# Patient Record
Sex: Female | Born: 1975 | Race: White | Hispanic: No | Smoking: Never smoker
Health system: Southern US, Community
[De-identification: ages and names within clinical notes are randomized; demographics above are authoritative.]

## PROBLEM LIST (undated history)

## (undated) DIAGNOSIS — K219 Gastro-esophageal reflux disease without esophagitis: Secondary | ICD-10-CM

## (undated) DIAGNOSIS — F329 Major depressive disorder, single episode, unspecified: Secondary | ICD-10-CM

## (undated) DIAGNOSIS — F419 Anxiety disorder, unspecified: Secondary | ICD-10-CM

## (undated) DIAGNOSIS — M549 Dorsalgia, unspecified: Secondary | ICD-10-CM

## (undated) DIAGNOSIS — Z87440 Personal history of urinary (tract) infections: Secondary | ICD-10-CM

## (undated) DIAGNOSIS — G4730 Sleep apnea, unspecified: Secondary | ICD-10-CM

## (undated) DIAGNOSIS — F32A Depression, unspecified: Secondary | ICD-10-CM

## (undated) DIAGNOSIS — A0472 Enterocolitis due to Clostridium difficile, not specified as recurrent: Secondary | ICD-10-CM

## (undated) DIAGNOSIS — N946 Dysmenorrhea, unspecified: Secondary | ICD-10-CM

## (undated) DIAGNOSIS — E559 Vitamin D deficiency, unspecified: Secondary | ICD-10-CM

## (undated) DIAGNOSIS — T7840XA Allergy, unspecified, initial encounter: Secondary | ICD-10-CM

## (undated) DIAGNOSIS — E7800 Pure hypercholesterolemia, unspecified: Secondary | ICD-10-CM

## (undated) DIAGNOSIS — Z9109 Other allergy status, other than to drugs and biological substances: Secondary | ICD-10-CM

## (undated) HISTORY — DX: Dysmenorrhea, unspecified: N94.6

## (undated) HISTORY — DX: Dorsalgia, unspecified: M54.9

## (undated) HISTORY — DX: Depression, unspecified: F32.A

## (undated) HISTORY — DX: Allergy, unspecified, initial encounter: T78.40XA

## (undated) HISTORY — DX: Personal history of urinary (tract) infections: Z87.440

## (undated) HISTORY — DX: Anxiety disorder, unspecified: F41.9

## (undated) HISTORY — DX: Pure hypercholesterolemia, unspecified: E78.00

## (undated) HISTORY — DX: Other allergy status, other than to drugs and biological substances: Z91.09

## (undated) HISTORY — DX: Vitamin D deficiency, unspecified: E55.9

## (undated) HISTORY — DX: Gastro-esophageal reflux disease without esophagitis: K21.9

## (undated) HISTORY — DX: Enterocolitis due to Clostridium difficile, not specified as recurrent: A04.72

## (undated) HISTORY — DX: Major depressive disorder, single episode, unspecified: F32.9

---

## 2001-01-10 HISTORY — PX: NASAL SINUS SURGERY: SHX719

## 2006-05-16 ENCOUNTER — Ambulatory Visit: Payer: Self-pay | Admitting: Emergency Medicine

## 2007-03-28 ENCOUNTER — Ambulatory Visit: Payer: Self-pay | Admitting: Internal Medicine

## 2007-12-20 ENCOUNTER — Ambulatory Visit: Payer: Self-pay | Admitting: Internal Medicine

## 2008-03-18 ENCOUNTER — Ambulatory Visit: Payer: Self-pay | Admitting: Family Medicine

## 2011-08-01 LAB — HM PAP SMEAR: HM Pap smear: NEGATIVE

## 2011-11-17 ENCOUNTER — Telehealth: Payer: Self-pay | Admitting: *Deleted

## 2011-11-18 ENCOUNTER — Other Ambulatory Visit: Payer: Self-pay | Admitting: *Deleted

## 2011-11-23 MED ORDER — DESOGESTREL-ETHINYL ESTRADIOL 0.15-0.02/0.01 MG (21/5) PO TABS: 1.0000 | ORAL_TABLET | Freq: Every day | ORAL | Status: DC

## 2011-11-23 NOTE — Telephone Encounter (Signed)
Refill sent to pharmacy for birth control pills. Patient had a appointment scheduled for 11/25/11 and canceled. Patient had been given six refills on script; called pharmacy and advised to cancel refillls until patient seen in office.

## 2011-11-23 NOTE — Telephone Encounter (Signed)
Medication refilled

## 2011-11-25 ENCOUNTER — Ambulatory Visit: Payer: Self-pay | Admitting: Internal Medicine

## 2011-12-21 ENCOUNTER — Telehealth: Payer: Self-pay | Admitting: Internal Medicine

## 2011-12-21 NOTE — Telephone Encounter (Signed)
Refill on her [facility] 0.15-0.2/0.01. Patient has an appointment in January.

## 2011-12-22 ENCOUNTER — Other Ambulatory Visit: Payer: Self-pay | Admitting: *Deleted

## 2011-12-22 MED ORDER — DESOGESTREL-ETHINYL ESTRADIOL 0.15-0.02/0.01 MG (21/5) PO TABS: 1.0000 | ORAL_TABLET | Freq: Every day | ORAL | Status: DC

## 2011-12-22 NOTE — Telephone Encounter (Signed)
Refilled birth control by phone with paharmacy

## 2012-01-31 ENCOUNTER — Other Ambulatory Visit: Payer: Self-pay | Admitting: Internal Medicine

## 2012-01-31 ENCOUNTER — Encounter: Payer: Self-pay | Admitting: Internal Medicine

## 2012-01-31 ENCOUNTER — Ambulatory Visit (INDEPENDENT_AMBULATORY_CARE_PROVIDER_SITE_OTHER): Payer: BC Managed Care – PPO | Admitting: Internal Medicine

## 2012-01-31 VITALS — BP 116/70 | HR 69 | Temp 98.2°F | Ht 70.5 in | Wt 176.5 lb

## 2012-01-31 DIAGNOSIS — Z139 Encounter for screening, unspecified: Secondary | ICD-10-CM

## 2012-01-31 DIAGNOSIS — N760 Acute vaginitis: Secondary | ICD-10-CM

## 2012-01-31 DIAGNOSIS — Z9109 Other allergy status, other than to drugs and biological substances: Secondary | ICD-10-CM

## 2012-01-31 MED ORDER — CLONAZEPAM 0.5 MG PO TABS: ORAL_TABLET | ORAL | Status: DC

## 2012-02-01 LAB — WET PREP BY MOLECULAR PROBE
Candida species: NEGATIVE
Trichomonas vaginosis: NEGATIVE

## 2012-02-03 ENCOUNTER — Encounter: Payer: Self-pay | Admitting: *Deleted

## 2012-02-05 ENCOUNTER — Encounter: Payer: Self-pay | Admitting: Internal Medicine

## 2012-02-05 DIAGNOSIS — Z9109 Other allergy status, other than to drugs and biological substances: Secondary | ICD-10-CM | POA: Insufficient documentation

## 2012-02-05 NOTE — Progress Notes (Signed)
Subjective:    Patient ID: Melissa Higgins, female    DOB: [birth year], 37 y.o.   MRN: [id number]  HPI 37 year old female with past history of reoccurring allergies and sinus issues who comes in today for a scheduled follow up.  She has been under increased stress lately with family/relationship issues.  Is separated now.  Feels she is handling things relatively well.  Does need something to help her relax and sleep.  She has previously tried [place].  Did not work for her.  Would like to try something else.  Overall doing better handling the stress.  Getting out.  Exercising.  Saw her allergist yesterday.  Using mucinex.  Humidifier.  Breathing stable.  Bowels stable.  She does report increased vaginal odor.  No urinary symptoms.   Past Medical History  Diagnosis Date  . Hypercholesterolemia   . Environmental allergies     allergy shots  . Dysmenorrhea   . C. difficile colitis     augment use  . History of frequent urinary tract infections     Current Outpatient Prescriptions on File Prior to Visit  Medication Sig Dispense Refill  . desogestrel-ethinyl estradiol (KARIVA,AZURETTE,MIRCETTE) 0.15-0.02/0.01 MG (21/5) tablet Take 1 tablet by mouth daily.  1 Package  0  . fexofenadine (ALLEGRA) 180 MG tablet Take 180 mg by mouth daily.      . clonazePAM (KLONOPIN) 0.5 MG tablet 1/2 tablet q hs prn  20 tablet  0    Review of Systems Patient denies any headache, lightheadedness or dizziness.  Some allergy symptoms.  Saw her allergist yesterday.   No chest pain, tightness or palpitations.  No increased shortness of breath, cough or congestion.  No nausea or vomiting.  No acid reflux.  No abdominal pain or cramping.  No bowel change, such as diarrhea, constipation, BRBPR or melana.  No urine change.  Does report the vaginal odor as outlined.        Objective:   Physical Exam Filed Vitals:   01/31/12 1503  BP: 116/70  Pulse: 69  Temp: 98.2 F (10.24 C)   37 year old female in no acute distress.    HEENT:  Nares- clear.  Oropharynx - without lesions. NECK:  Supple.  Nontender.  No audible bruit.  HEART:  Appears to be regular. LUNGS:  No crackles or wheezing audible.  Respirations even and unlabored.  RADIAL PULSE:  Equal bilaterally.   ABDOMEN:  Soft, nontender.  Bowel sounds present and normal.  No audible abdominal bruit.  GU:  Normal external genitalia.  Vaginal vault without lesions.  Cervix identified.  No lesions.  Could not appreciate any adnexal masses or tenderness.  Discharge present.  Wet prep sent.  EXTREMITIES:  No increased edema present.  DP pulses palpable and equal bilaterally.           Assessment & Plan:  VAGINITIS.  Wet prep sent.    INCREASED PSYCHOSOCIAL STRESSORS.  Feels she is handling things relatively well.  Needs something to help her sleep.  See above.  Did not do well with ambien.  Will try clonazepam .5mg  1/2 tablet q hs prn.  She is not sexually active.  Instructed - contraindication with pregnancy.   Follow. Seeing Madaline Guthrie.  Due to follow up with him today.    HEADACHES.  Better.  Follow.    THYROID FULLNESS.  Ultrasound (thyroid) 03/28/07 - unremarkable.  TSH checked 06/21/11 - normal.  Follow.    HEALTH MAINTENANCE.  Physical 05/11/11.  Pap 08/01/11.

## 2012-02-05 NOTE — Assessment & Plan Note (Signed)
Sees Dr McQueen.  Receiving allergy injections.  Follow.    

## 2012-03-07 ENCOUNTER — Telehealth: Payer: Self-pay | Admitting: *Deleted

## 2012-03-07 NOTE — Telephone Encounter (Signed)
Patient called regarding refill;advised her to call her pharmacy and they would contact us

## 2012-03-08 ENCOUNTER — Other Ambulatory Visit: Payer: Self-pay | Admitting: *Deleted

## 2012-03-08 MED ORDER — CLONAZEPAM 0.5 MG PO TABS: ORAL_TABLET | ORAL | Status: DC

## 2012-03-08 NOTE — Telephone Encounter (Signed)
[facility] calling, state that they have tried faxing request for refill several times but keep getting  Fail message back.  Confirmed fax# is correct.  Needing clonazepam 0.5 #20.   Fax [phone].

## 2012-03-08 NOTE — Telephone Encounter (Signed)
Called in refill x 1.  (#20 with no refills).  Clonazepam.

## 2012-03-20 ENCOUNTER — Ambulatory Visit (INDEPENDENT_AMBULATORY_CARE_PROVIDER_SITE_OTHER): Payer: BC Managed Care – PPO | Admitting: Adult Health

## 2012-03-20 ENCOUNTER — Encounter: Payer: Self-pay | Admitting: Adult Health

## 2012-03-20 VITALS — BP 119/75 | HR 71 | Temp 98.4°F | Resp 16 | Ht 70.0 in | Wt 175.0 lb

## 2012-03-20 DIAGNOSIS — M25552 Pain in left hip: Secondary | ICD-10-CM | POA: Insufficient documentation

## 2012-03-20 DIAGNOSIS — M25559 Pain in unspecified hip: Secondary | ICD-10-CM

## 2012-03-20 DIAGNOSIS — J329 Chronic sinusitis, unspecified: Secondary | ICD-10-CM

## 2012-03-20 MED ORDER — AZITHROMYCIN 250 MG PO TABS: ORAL_TABLET | ORAL | Status: DC

## 2012-03-20 MED ORDER — FLUTICASONE PROPIONATE 50 MCG/ACT NA SUSP: 2.0000 | Freq: Every day | NASAL | Status: DC

## 2012-03-20 MED ORDER — DICLOFENAC SODIUM 1 % TD GEL: 4.0000 g | Freq: Four times a day (QID) | TRANSDERMAL | Status: DC

## 2012-03-20 MED ORDER — IBUPROFEN 800 MG PO TABS: 800.0000 mg | ORAL_TABLET | Freq: Three times a day (TID) | ORAL | Status: DC | PRN

## 2012-03-20 NOTE — Assessment & Plan Note (Signed)
Patient is very athletic. She works out with a Psychologist, educational regularly. She had a similar injury in the summer where her right hip became painful. However, this completely subsided. Given her symptoms this could very well be inflammation of the bursa which is very common in an active person. This could also be an overuse injury. We will try high dose anti-inflammatory for 3-4 days, rest, ice/heat 3-4 times daily, and topical anti-inflammatory medication. I have advised her to use a firm pillow between her knees when she sleeps on her side, she should avoid lying on the left hip, use a pillow under her knees when lying on her back, stretching exercises, she can continue the Epsom salt and Jacuzzi. I have asked her to pay close attention to those exercises that may cause discomfort. Avoid those. She may want to try an elliptical machine over a treadmill to avoid stress on her hips. RTC in 2 weeks or sooner if symptoms do not improve.

## 2012-03-20 NOTE — Patient Instructions (Addendum)
  For your sinuses:  Start you antibiotic tonight.  Also start the flonase nasal spray.  For your hip:  Take high dose ibuprofen every 6-8 hours for 3-4 days. Take this with food.  Apply voltaren gel to the hip for topical pain relief four times daily.  Rest the area. Apply ice alternating with heat 3-4 times daily.  Use pillow between your knees if you sleep on your side. Use a pillow under your knees while lying on you back.  Please call if your symptoms do not improve within 2 weeks.

## 2012-03-20 NOTE — Progress Notes (Signed)
  Subjective:    Patient ID: Melissa Higgins, female    DOB: 08/08/1975, 37 y.o.   MRN: [id number]  HPI  Patient is a pleasant 37 year old female who presents to clinic with the following concerns:  1. Left hip pain - patient has a history of right hip pain that began last summer. She describes her symptoms as resolving. Now her left hip has been bothering her. She exercises regularly and also has a trainer that she works out with. She reports initially the pain was shooting down the outside of her thigh. This has resolved. Now the pain is described as dull which is localized over her the lateral hip. Sitting aggravates her symptoms. She has tried Epsom salt soaks, sitting in a Jacuzzi, and stretching exercises. All of these produced relief. She has not tried any over-the-counter medications.  2. Sinus pressure - the patient has had a sinus headache for approximately 2-3 days. She reports dry sinuses, eye discomfort, sore throat. She has tried Mucinex and is currently taking this. She is also using her saline nasal rinses. She denies fever, chills, shortness of breath. Patient has a history of allergic rhinitis which seem to aggravate her symptoms. She has also had sinus surgery in the past and reports occasionally having problems.   Current Outpatient Prescriptions on File Prior to Visit  Medication Sig Dispense Refill  . clonazePAM (KLONOPIN) 0.5 MG tablet 1/2 tablet q hs prn  20 tablet  0  . desogestrel-ethinyl estradiol (KARIVA,AZURETTE,MIRCETTE) 0.15-0.02/0.01 MG (21/5) tablet Take 1 tablet by mouth daily.  1 Package  0  . guaiFENesin (MUCINEX) 600 MG 12 hr tablet Take 1,200 mg by mouth 2 (two) times daily.       No current facility-administered medications on file prior to visit.      Review of Systems  Constitutional: Negative for chills and fatigue.  HENT: Positive for postnasal drip and sinus pressure. Negative for rhinorrhea.        Dry sinuses. Pressure and HA for 2-3 days.  Eyes:  Negative for itching.  Respiratory: Negative for chest tightness, shortness of breath and wheezing.   Cardiovascular: Negative for chest pain.  Musculoskeletal:       Left hip pain. Initially reported as shooting pain now dull ache. Exercises with trainer regularly.    BP 119/75  Pulse 71  Temp(Src) 98.4 F (36.9 C) (Oral)  Resp 16  Ht 5\' 10"  (1.778 m)  Wt 175 lb (79.379 kg)  BMI 25.11 kg/m2  SpO2 98%  PF 7 L/min  LMP 03/13/2012     Objective:   Physical Exam  Constitutional: She appears well-developed and well-nourished.  HENT:  Pharyngeal erythema. Post nasal drip  Cardiovascular: Normal rate, regular rhythm and normal heart sounds.  Exam reveals no gallop.   No murmur heard. Pulmonary/Chest: Effort normal and breath sounds normal. She has no wheezes. She has no rales.  Musculoskeletal: Normal range of motion. She exhibits no edema.  No tenderness to palpating left hip. No clicking   Lymphadenopathy:    She has no cervical adenopathy.  Psychiatric: She has a normal mood and affect. Her behavior is normal. Judgment and thought content normal.        Assessment & Plan:

## 2012-03-20 NOTE — Assessment & Plan Note (Signed)
I suspect her sinusitis is aggravated by her persistent allergic rhinitis. She has c/o of sore throat. I will start her on a Z-Pak. Patient is allergic to Augmentin. I will also start her on Flonase 2 sprays in each nostril daily. Patient takes daily antihistamine which can be very drying to her sinuses; however, she is unable to go without her medication because of the severity of her allergy symptoms. Patient also sees an allergist and receives allergy injections.

## 2012-03-26 ENCOUNTER — Telehealth: Payer: Self-pay | Admitting: *Deleted

## 2012-03-26 NOTE — Telephone Encounter (Signed)
Patient left a message on voicemail stating the prescription cream/gel Raquel gave her at her OV last week, the pharmacy is having trouble filling that. Stated it was something about her insurance company. Please call patient.

## 2012-03-26 NOTE — Telephone Encounter (Signed)
Spoke to patient and was advised that she thinks that she needs a prior authorization on the medication. Patient states that she will call the pharmacy and have them send the request for a PA directly to Raquel. Patient states that she will probably go ahead and pay for the medication herself so that she can start using it, but does want a PA done on it for future refills.

## 2012-04-04 ENCOUNTER — Other Ambulatory Visit: Payer: Self-pay | Admitting: *Deleted

## 2012-04-04 ENCOUNTER — Telehealth: Payer: Self-pay | Admitting: *Deleted

## 2012-04-04 MED ORDER — DESOGESTREL-ETHINYL ESTRADIOL 0.15-0.02/0.01 MG (21/5) PO TABS: 1.0000 | ORAL_TABLET | Freq: Every day | ORAL | Status: DC

## 2012-04-04 NOTE — Telephone Encounter (Signed)
Melissa Higgins,  Could you let me know the status of the prior auth for this patient and if there is anything I need to do. The medication is Voltaren 1% gel.  Melissa Higgins

## 2012-04-04 NOTE — Telephone Encounter (Signed)
I have not received anything on this pt. 

## 2012-04-05 ENCOUNTER — Telehealth: Payer: Self-pay | Admitting: *Deleted

## 2012-04-05 NOTE — Telephone Encounter (Signed)
Requested prior auth form be faxed to the office from pharmacy for Voltaren 1% gel

## 2012-04-10 ENCOUNTER — Telehealth: Payer: Self-pay | Admitting: *Deleted

## 2012-04-10 NOTE — Telephone Encounter (Signed)
Called [phone] for prior authorization on the Voltaren 1% gel, form is being faxed over now

## 2012-04-11 ENCOUNTER — Telehealth: Payer: Self-pay | Admitting: *Deleted

## 2012-04-11 NOTE — Telephone Encounter (Signed)
Faxed Prior Auth to [employer]

## 2012-08-09 ENCOUNTER — Encounter: Payer: Self-pay | Admitting: Internal Medicine

## 2012-08-09 ENCOUNTER — Ambulatory Visit (INDEPENDENT_AMBULATORY_CARE_PROVIDER_SITE_OTHER): Payer: BC Managed Care – PPO | Admitting: Internal Medicine

## 2012-08-09 VITALS — BP 100/60 | HR 76 | Temp 98.5°F | Ht 70.75 in | Wt 179.2 lb

## 2012-08-09 DIAGNOSIS — Z9109 Other allergy status, other than to drugs and biological substances: Secondary | ICD-10-CM

## 2012-08-09 DIAGNOSIS — N760 Acute vaginitis: Secondary | ICD-10-CM

## 2012-08-11 ENCOUNTER — Encounter: Payer: Self-pay | Admitting: Internal Medicine

## 2012-08-11 NOTE — Assessment & Plan Note (Signed)
Sees Dr McQueen.  Receiving allergy injections.  Follow.    

## 2012-08-11 NOTE — Progress Notes (Signed)
Subjective:    Patient ID: Melissa Higgins, female    DOB: 08/06/1975, 37 y.o.   MRN: [id number]  HPI 37 year old female with past history of reoccurring allergies and sinus issues who comes in today to follow up on these issues as well as for a complete physical exam.  She has been under increased stress lately with family/relationship issues.  Is separated now.  Planning to finalize her divorce next week.  Feels she is handling things relatively well.  Overall doing better handling the stress.  Getting out.  Exercising.  Due to see her counselor later today.  Sees [employer].  Helping.  Breathing stable.  Bowels stable.  She does report increased vaginal odor.  No urinary symptoms.    Past Medical History  Diagnosis Date  . Hypercholesterolemia   . Environmental allergies     allergy shots  . Dysmenorrhea   . C. difficile colitis     augment use  . History of frequent urinary tract infections     Current Outpatient Prescriptions on File Prior to Visit  Medication Sig Dispense Refill  . desogestrel-ethinyl estradiol (KARIVA,AZURETTE,MIRCETTE) 0.15-0.02/0.01 MG (21/5) tablet Take 1 tablet by mouth daily.  1 Package  6  . guaiFENesin (MUCINEX) 600 MG 12 hr tablet Take 1,200 mg by mouth as needed.       Marland Kitchen ibuprofen (ADVIL,MOTRIN) 800 MG tablet Take 1 tablet (800 mg total) by mouth every 8 (eight) hours as needed for pain.  45 tablet  1   No current facility-administered medications on file prior to visit.    Review of Systems Patient denies any headache, lightheadedness or dizziness.  No significant sinus or allergy symptoms now.  No chest pain, tightness or palpitations.  No increased shortness of breath, cough or congestion.  No nausea or vomiting.  No acid reflux.  No abdominal pain or cramping.  No bowel change, such as diarrhea, constipation, BRBPR or melana.  No urine change.  Does report the vaginal odor as outlined.   Not taking anything now to help her sleep.  Sleeping better.  LMP  last week.  Feet doing better since she had shoes made.       Objective:   Physical Exam  Filed Vitals:   08/09/12 0835  BP: 100/60  Pulse: 76  Temp: 98.5 F (36.9 C)   Pulse recheck:  75  37 year old female in no acute distress.   HEENT:  Nares- clear.  Oropharynx - without lesions. NECK:  Supple.  Nontender.  No audible bruit.  HEART:  Appears to be regular. LUNGS:  No crackles or wheezing audible.  Respirations even and unlabored.  RADIAL PULSE:  Equal bilaterally.    BREASTS:  No nipple discharge or nipple retraction present.  Could not appreciate any distinct nodules or axillary adenopathy.  ABDOMEN:  Soft, nontender.  Bowel sounds present and normal.  No audible abdominal bruit.  GU:  Normal external genitalia.  Vaginal vault without lesions.  Cervix identified.  No lesions.  Discharge present.  Could not appreciate any adnexal masses or tenderness.  EXTREMITIES:  No increased edema present.  DP pulses palpable and equal bilaterally.           Assessment & Plan:  VAGINITIS.  Wet prep sent.    INCREASED PSYCHOSOCIAL STRESSORS.  Feels she is handling things relatively well.  Doing better.  Sleeping better.  Follow. Seeing Madaline Guthrie.  Due to follow up with him today.    HEADACHES.  Better.  Follow.    THYROID FULLNESS.  Ultrasound (thyroid) 03/28/07 - unremarkable.  TSH checked 06/21/11 - normal.  Follow.    HEALTH MAINTENANCE.  Physical today.  Pap 08/01/11.

## 2012-08-12 ENCOUNTER — Encounter: Payer: Self-pay | Admitting: Internal Medicine

## 2012-08-13 LAB — WET PREP BY MOLECULAR PROBE
Candida species: POSITIVE — AB
Gardnerella vaginalis: NEGATIVE
Trichomonas vaginosis: NEGATIVE

## 2012-08-14 ENCOUNTER — Encounter: Payer: Self-pay | Admitting: Internal Medicine

## 2012-08-20 ENCOUNTER — Encounter: Payer: Self-pay | Admitting: Internal Medicine

## 2012-08-27 ENCOUNTER — Telehealth: Payer: Self-pay | Admitting: Internal Medicine

## 2012-08-27 NOTE — Telephone Encounter (Signed)
Lupita Leash, can you put this pt in at 1:00? She is already aware of the appt. Thanks

## 2012-08-27 NOTE — Telephone Encounter (Signed)
Pt calling regarding an appt w/ Dr. Lorin Picket.  States she is to be added on to schedule Wednesday per email.  Pt states she can come on Wednesday morning.  Please advise.

## 2012-08-27 NOTE — Telephone Encounter (Signed)
Where do you want to put her on Wednesday's schedule?

## 2012-08-27 NOTE — Telephone Encounter (Signed)
I can see her Wednesday early pm.  My am is full.  I will be happy to work her in somewhere around 1:00 Wed if possible.

## 2012-09-17 ENCOUNTER — Other Ambulatory Visit: Payer: Self-pay | Admitting: Internal Medicine

## 2012-09-17 NOTE — Telephone Encounter (Signed)
Eprescribed.

## 2012-11-15 ENCOUNTER — Other Ambulatory Visit: Payer: Self-pay

## 2012-12-03 ENCOUNTER — Ambulatory Visit (INDEPENDENT_AMBULATORY_CARE_PROVIDER_SITE_OTHER): Payer: BC Managed Care – PPO | Admitting: Adult Health

## 2012-12-03 ENCOUNTER — Encounter: Payer: Self-pay | Admitting: Adult Health

## 2012-12-03 VITALS — BP 118/80 | HR 78 | Temp 97.8°F | Resp 16 | Wt 179.0 lb

## 2012-12-03 DIAGNOSIS — R21 Rash and other nonspecific skin eruption: Secondary | ICD-10-CM

## 2012-12-03 MED ORDER — ERYTHROMYCIN 2 % EX GEL: Freq: Two times a day (BID) | CUTANEOUS | Status: DC

## 2012-12-03 NOTE — Progress Notes (Signed)
  Subjective:    Patient ID: Melissa Higgins, female    DOB: [birth year], 37 y.o.   MRN: [id number]  HPI Patient is a pleasant 37 year old female who presents to clinic with a nonspecific skin rash that is reoccurring x6 weeks. The rash is located below the left side corner of her nose and the left side of chin below her lip. She has treated it with over-the-counter acne medication without complete resolution. The rash is not itchy; it is not painful. She denies drainage from the site.   Current Outpatient Prescriptions on File Prior to Visit  Medication Sig Dispense Refill  . guaiFENesin (MUCINEX) 600 MG 12 hr tablet Take 1,200 mg by mouth as needed.       Marland Kitchen ibuprofen (ADVIL,MOTRIN) 800 MG tablet Take 1 tablet (800 mg total) by mouth every 8 (eight) hours as needed for pain.  45 tablet  1  . KARIVA 0.15-0.02/0.01 MG (21/5) tablet TAKE ONE TABLET BY MOUTH DAILY  28 tablet  6   No current facility-administered medications on file prior to visit.      Review of Systems  Skin: Positive for rash.       Left side corner of nose and left side of chin below the lip       Objective:   Physical Exam  Constitutional: She is oriented to person, place, and time. She appears well-developed and well-nourished. No distress.  Neurological: She is alert and oriented to person, place, and time.  Skin: Rash noted.  Area of rash with raised areas. Vesicle appearance.   Psychiatric: She has a normal mood and affect. Her behavior is normal. Thought content normal.   BP 118/80  Pulse 78  Temp(Src) 97.8 F (36.6 C) (Oral)  Resp 16  Wt 179 lb (81.194 kg)  SpO2 98%        Assessment & Plan:

## 2012-12-03 NOTE — Assessment & Plan Note (Signed)
?   Acne vs. Viral or other bacterial producing rash. Send viral culture and routine culture. Treat empirically with erythromycin gel twice a day.

## 2012-12-03 NOTE — Progress Notes (Signed)
Pre visit review using our clinic review tool, if applicable. No additional management support is needed unless otherwise documented below in the visit note. 

## 2012-12-06 ENCOUNTER — Encounter: Payer: Self-pay | Admitting: Adult Health

## 2012-12-06 LAB — WOUND CULTURE
Gram Stain: NONE SEEN
Organism ID, Bacteria: NO GROWTH

## 2012-12-10 LAB — VIRAL CULTURE VIRC

## 2012-12-20 ENCOUNTER — Ambulatory Visit: Payer: BC Managed Care – PPO | Admitting: Internal Medicine

## 2013-02-04 ENCOUNTER — Encounter: Payer: Self-pay | Admitting: Internal Medicine

## 2013-02-05 NOTE — Telephone Encounter (Signed)
Noted  

## 2013-02-12 ENCOUNTER — Encounter: Payer: Self-pay | Admitting: Internal Medicine

## 2013-02-12 ENCOUNTER — Ambulatory Visit (INDEPENDENT_AMBULATORY_CARE_PROVIDER_SITE_OTHER): Payer: BC Managed Care – PPO | Admitting: Internal Medicine

## 2013-02-12 ENCOUNTER — Other Ambulatory Visit: Payer: Self-pay | Admitting: Internal Medicine

## 2013-02-12 VITALS — BP 120/82 | HR 70 | Temp 97.9°F | Ht 70.75 in | Wt 181.5 lb

## 2013-02-12 DIAGNOSIS — G479 Sleep disorder, unspecified: Secondary | ICD-10-CM

## 2013-02-12 DIAGNOSIS — Z9109 Other allergy status, other than to drugs and biological substances: Secondary | ICD-10-CM

## 2013-02-12 MED ORDER — PANTOPRAZOLE SODIUM 40 MG PO TBEC: 40.0000 mg | DELAYED_RELEASE_TABLET | Freq: Every day | ORAL | Status: DC

## 2013-02-12 MED ORDER — TRAZODONE HCL 50 MG PO TABS: 25.0000 mg | ORAL_TABLET | Freq: Every evening | ORAL | Status: DC | PRN

## 2013-02-12 NOTE — Progress Notes (Signed)
Pre-visit discussion using our clinic review tool. No additional management support is needed unless otherwise documented below in the visit note.  

## 2013-02-17 ENCOUNTER — Encounter: Payer: Self-pay | Admitting: Internal Medicine

## 2013-02-17 ENCOUNTER — Telehealth: Payer: Self-pay | Admitting: Internal Medicine

## 2013-02-17 DIAGNOSIS — G479 Sleep disorder, unspecified: Secondary | ICD-10-CM | POA: Insufficient documentation

## 2013-02-17 NOTE — Telephone Encounter (Signed)
Pt needs f/u appt in 2 months (26min) or end of 1/2 day.

## 2013-02-17 NOTE — Progress Notes (Signed)
  Subjective:    Patient ID: Melissa Higgins, female    DOB: 05/20/1975, 38 y.o.   MRN: [id number]  HPI 38 year old female with past history of reoccurring allergies and sinus issues who comes in today for a scheduled follow up.  She has been under increased stress lately with family/relationship issues.  Is divorced now.  Feels she is handling things relatively well.  Overall doing better handling the stress.  Exercising.  Breathing stable.  Bowels stable.  Not sleeping.  Discussed this at length.  Is a persistent problem for her.     Past Medical History  Diagnosis Date  . Hypercholesterolemia   . Environmental allergies     allergy shots  . Dysmenorrhea   . C. difficile colitis     augment use  . History of frequent urinary tract infections     Current Outpatient Prescriptions on File Prior to Visit  Medication Sig Dispense Refill  . guaiFENesin (MUCINEX) 600 MG 12 hr tablet Take 1,200 mg by mouth as needed.       Marland Kitchen ibuprofen (ADVIL,MOTRIN) 800 MG tablet Take 1 tablet (800 mg total) by mouth every 8 (eight) hours as needed for pain.  45 tablet  1  . KARIVA 0.15-0.02/0.01 MG (21/5) tablet TAKE ONE TABLET BY MOUTH DAILY  28 tablet  6  . erythromycin with ethanol (EMGEL) 2 % gel Apply topically 2 (two) times daily.  30 g  0   No current facility-administered medications on file prior to visit.    Review of Systems Patient denies any headache, lightheadedness or dizziness.  No significant sinus or allergy symptoms now.  No chest pain, tightness or palpitations.  No increased shortness of breath, cough or congestion.  No nausea or vomiting.  No acid reflux.  No abdominal pain or cramping.  No bowel change, such as diarrhea, constipation, BRBPR or melana.  No urine change.  Sleeping issues as outlined.        Objective:   Physical Exam  Filed Vitals:   02/12/13 1641  BP: 120/82  Pulse: 70  Temp: 97.9 F (62.30 C)   38 year old female in no acute distress.   HEENT:  Nares-  clear.  Oropharynx - without lesions. NECK:  Supple.  Nontender.  No audible bruit.  HEART:  Appears to be regular. LUNGS:  No crackles or wheezing audible.  Respirations even and unlabored.  RADIAL PULSE:  Equal bilaterally.   ABDOMEN:  Soft, nontender.  Bowel sounds present and normal.  No audible abdominal bruit.   EXTREMITIES:  No increased edema present.  DP pulses palpable and equal bilaterally.           Assessment & Plan:  INCREASED PSYCHOSOCIAL STRESSORS.  Feels she is handling things relatively well.  Doing better.  Sleeping better. Follow. Was seeing Ellin Saba.  Follow.     HEADACHES.  Better.  Follow.    THYROID FULLNESS.  Ultrasound (thyroid) 03/28/07 - unremarkable.  Follow.  .    HEALTH MAINTENANCE.  Physical 08/09/12.  Pap 08/01/11.  Needs baseline mammogram if has not had.    I spent 25 minutes with the patient and more than 50% of the time was spent in consultation regarding the above.

## 2013-02-17 NOTE — Assessment & Plan Note (Signed)
Sees Dr McQueen.  Receiving allergy injections.  Follow.    

## 2013-02-17 NOTE — Assessment & Plan Note (Signed)
Has been a persistent intermittent issue.  Persistent lately.  Discussed treatment options.  Start trazodone as directed.  Follow.

## 2013-02-19 NOTE — Telephone Encounter (Signed)
Sent my chart message appointment 4/15

## 2013-03-24 ENCOUNTER — Other Ambulatory Visit: Payer: Self-pay | Admitting: Internal Medicine

## 2013-04-07 ENCOUNTER — Other Ambulatory Visit: Payer: Self-pay | Admitting: Internal Medicine

## 2013-04-22 ENCOUNTER — Encounter: Payer: Self-pay | Admitting: Internal Medicine

## 2013-04-24 ENCOUNTER — Ambulatory Visit: Payer: BC Managed Care – PPO | Admitting: Internal Medicine

## 2013-04-26 ENCOUNTER — Encounter: Payer: Self-pay | Admitting: Internal Medicine

## 2013-04-26 ENCOUNTER — Other Ambulatory Visit (HOSPITAL_COMMUNITY)
Admission: RE | Admit: 2013-04-26 | Discharge: 2013-04-26 | Disposition: A | Discharge status: Home / Self Care | Payer: BC Managed Care – PPO | Source: Ambulatory Visit | Attending: Internal Medicine | Admitting: Internal Medicine

## 2013-04-26 ENCOUNTER — Ambulatory Visit (INDEPENDENT_AMBULATORY_CARE_PROVIDER_SITE_OTHER): Payer: BC Managed Care – PPO | Admitting: Internal Medicine

## 2013-04-26 VITALS — BP 110/70 | HR 78 | Temp 98.1°F | Ht 70.75 in | Wt 181.5 lb

## 2013-04-26 DIAGNOSIS — N760 Acute vaginitis: Secondary | ICD-10-CM | POA: Insufficient documentation

## 2013-04-26 DIAGNOSIS — N939 Abnormal uterine and vaginal bleeding, unspecified: Secondary | ICD-10-CM

## 2013-04-26 DIAGNOSIS — N898 Other specified noninflammatory disorders of vagina: Secondary | ICD-10-CM

## 2013-04-26 DIAGNOSIS — Z139 Encounter for screening, unspecified: Secondary | ICD-10-CM

## 2013-04-26 DIAGNOSIS — G479 Sleep disorder, unspecified: Secondary | ICD-10-CM

## 2013-04-26 DIAGNOSIS — Z113 Encounter for screening for infections with a predominantly sexual mode of transmission: Secondary | ICD-10-CM | POA: Insufficient documentation

## 2013-04-26 NOTE — Progress Notes (Signed)
Pre visit review using our clinic review tool, if applicable. No additional management support is needed unless otherwise documented below in the visit note. 

## 2013-04-26 NOTE — Progress Notes (Signed)
Subjective:    Patient ID: Melissa Higgins, female    DOB: June 22, 1975, 38 y.o.   MRN: [id number]  HPI 38 year old female with past history of reoccurring allergies and sinus issues who comes in today as a work in with concerns over some vaginal bleeding after intercourse.  She has been under increased stress lately with family/relationship issues.  Is divorced now.  Feels she is handling things relatively well.  Overall doing better handling the stress.  Exercising.  Breathing stable.  Bowels stable.  She is in a new relationship.  States that in 11/14, she noticed a little vaginal spotting after intercourse.  States she was due to start and did not think much about it.  Then, last week noticed some bleeding after intercourse.  Just noticed when she wiped.  No bleeding since.  States her periods are regular.  Last approximately three days.  She reports no increased pain with intercourse.  Does report some persistent discharge and odor.  No abdominal pain or cramping.  No fever or chills.  No urinary symptoms.     Past Medical History  Diagnosis Date  . Hypercholesterolemia   . Environmental allergies     allergy shots  . Dysmenorrhea   . C. difficile colitis     augment use  . History of frequent urinary tract infections     Current Outpatient Prescriptions on File Prior to Visit  Medication Sig Dispense Refill  . AZURETTE 0.15-0.02/0.01 MG (21/5) tablet TAKE 1 TABLET BY MOUTH EVERY DAY  28 tablet  11  . guaiFENesin (MUCINEX) 600 MG 12 hr tablet Take 1,200 mg by mouth as needed.       . pantoprazole (PROTONIX) 40 MG tablet Take 1 tablet (40 mg total) by mouth daily.  30 tablet  2  . traZODone (DESYREL) 50 MG tablet TAKE 1/2 TO 1 TABLET BY MOUTH EVERY NIGHT AT BEDTIME AS NEEDED FOR SLEEP  90 tablet  0   No current facility-administered medications on file prior to visit.    Review of Systems Patient denies any headache, lightheadedness or dizziness.   No increased shortness of breath,  cough or congestion.  No nausea or vomiting.  No acid reflux.  No abdominal pain or cramping.  No bowel change, such as diarrhea, constipation, BRBPR or melana.  No urine change.  Vaginal symptoms as outlined.         Objective:   Physical Exam  Filed Vitals:   04/26/13 1509  BP: 110/70  Pulse: 78  Temp: 98.1 F (76.70 C)   38 year old female in no acute distress.  NECK:  Supple.  Nontender.   HEART:  Appears to be regular. LUNGS:  No crackles or wheezing audible.  Respirations even and unlabored.  RADIAL PULSE:  Equal bilaterally.   ABDOMEN:  Soft, nontender.  Bowel sounds present and normal.  No audible abdominal bruit.  GU:  Normal external genitalia.  Vaginal vault without lesions.  Cervix identified.  No lesions. No polyps.   Could not appreciate any adnexal masses or tenderness.  Wet prep and GC/chlamydia sent.   EXTREMITIES:  No increased edema present.            Assessment & Plan:  INCREASED PSYCHOSOCIAL STRESSORS.  Feels she is handling things relatively well.  Doing better.  Sleeping better. Follow. Was seeing Ellin Saba.  Follow.     HEADACHES.  Better.  Follow.    THYROID FULLNESS.  Ultrasound (thyroid) 03/28/07 - unremarkable.  Follow.  .    HEALTH MAINTENANCE.  Physical 08/09/12.  Pap 08/01/11.  Needs baseline mammogram if has not had.    I spent 25 minutes with the patient and more than 50% of the time was spent in consultation regarding the above.

## 2013-04-27 LAB — WET PREP BY MOLECULAR PROBE
Candida species: NEGATIVE
GARDNERELLA VAGINALIS: POSITIVE — AB
Trichomonas vaginosis: NEGATIVE

## 2013-04-28 ENCOUNTER — Encounter: Payer: Self-pay | Admitting: Internal Medicine

## 2013-04-28 ENCOUNTER — Telehealth: Payer: Self-pay | Admitting: Internal Medicine

## 2013-04-28 MED ORDER — METRONIDAZOLE 0.75 % VA GEL: 1.0000 | Freq: Two times a day (BID) | VAGINAL | Status: DC

## 2013-04-28 NOTE — Telephone Encounter (Signed)
Pt notified of positive G. Vag.  Rx for metrogel sent in to [facility].

## 2013-04-29 ENCOUNTER — Encounter: Payer: Self-pay | Admitting: Internal Medicine

## 2013-04-29 DIAGNOSIS — N939 Abnormal uterine and vaginal bleeding, unspecified: Secondary | ICD-10-CM | POA: Insufficient documentation

## 2013-04-29 NOTE — Assessment & Plan Note (Signed)
Vaginal bleeding/spotting as outlined.  Exam as outlined.  Await wet prep and culture results.  Follow.  If persistent problems, may need gyn referral.

## 2013-04-29 NOTE — Assessment & Plan Note (Signed)
Appears to be doing better. Follow.  

## 2013-04-30 LAB — CERVICOVAGINAL ANCILLARY ONLY
Chlamydia: NEGATIVE
NEISSERIA GONORRHEA: NEGATIVE
TRICH (WINDOWPATH): NEGATIVE

## 2013-05-01 ENCOUNTER — Encounter: Payer: Self-pay | Admitting: Internal Medicine

## 2013-05-01 LAB — CERVICOVAGINAL ANCILLARY ONLY
BACTERIAL VAGINITIS: POSITIVE — AB
Candida vaginitis: NEGATIVE

## 2013-05-03 ENCOUNTER — Ambulatory Visit: Payer: BC Managed Care – PPO | Admitting: Internal Medicine

## 2013-06-16 ENCOUNTER — Encounter: Payer: Self-pay | Admitting: Internal Medicine

## 2013-06-17 ENCOUNTER — Telehealth: Payer: Self-pay | Admitting: Internal Medicine

## 2013-06-17 DIAGNOSIS — N939 Abnormal uterine and vaginal bleeding, unspecified: Secondary | ICD-10-CM

## 2013-06-17 NOTE — Telephone Encounter (Signed)
Order placed for gyn referral.  Pt notified via my chart.

## 2013-09-10 ENCOUNTER — Other Ambulatory Visit (HOSPITAL_COMMUNITY)
Admission: RE | Admit: 2013-09-10 | Discharge: 2013-09-10 | Disposition: A | Discharge status: Home / Self Care | Payer: BC Managed Care – PPO | Source: Ambulatory Visit | Attending: Internal Medicine | Admitting: Internal Medicine

## 2013-09-10 ENCOUNTER — Ambulatory Visit (INDEPENDENT_AMBULATORY_CARE_PROVIDER_SITE_OTHER): Payer: BC Managed Care – PPO | Admitting: Internal Medicine

## 2013-09-10 ENCOUNTER — Encounter: Payer: Self-pay | Admitting: Internal Medicine

## 2013-09-10 VITALS — BP 120/70 | HR 65 | Temp 98.3°F | Ht 70.0 in | Wt 191.8 lb

## 2013-09-10 DIAGNOSIS — R87810 Cervical high risk human papillomavirus (HPV) DNA test positive: Secondary | ICD-10-CM | POA: Insufficient documentation

## 2013-09-10 DIAGNOSIS — Z01419 Encounter for gynecological examination (general) (routine) without abnormal findings: Secondary | ICD-10-CM | POA: Diagnosis not present

## 2013-09-10 DIAGNOSIS — Z1151 Encounter for screening for human papillomavirus (HPV): Secondary | ICD-10-CM | POA: Insufficient documentation

## 2013-09-10 DIAGNOSIS — Z13220 Encounter for screening for lipoid disorders: Secondary | ICD-10-CM

## 2013-09-10 DIAGNOSIS — Z9109 Other allergy status, other than to drugs and biological substances: Secondary | ICD-10-CM

## 2013-09-10 DIAGNOSIS — Z124 Encounter for screening for malignant neoplasm of cervix: Secondary | ICD-10-CM

## 2013-09-10 DIAGNOSIS — N939 Abnormal uterine and vaginal bleeding, unspecified: Secondary | ICD-10-CM

## 2013-09-10 DIAGNOSIS — Z1239 Encounter for other screening for malignant neoplasm of breast: Secondary | ICD-10-CM

## 2013-09-10 DIAGNOSIS — G479 Sleep disorder, unspecified: Secondary | ICD-10-CM

## 2013-09-10 DIAGNOSIS — N898 Other specified noninflammatory disorders of vagina: Secondary | ICD-10-CM

## 2013-09-10 NOTE — Progress Notes (Signed)
Pre visit review using our clinic review tool, if applicable. No additional management support is needed unless otherwise documented below in the visit note. 

## 2013-09-15 ENCOUNTER — Encounter: Payer: Self-pay | Admitting: Internal Medicine

## 2013-09-15 NOTE — Assessment & Plan Note (Signed)
Still having trouble.  Discussed options.  Decided to take trazodone on a regular basis and see how this works.  Follow.  Does not appear to have evidence of sleep apnea.

## 2013-09-15 NOTE — Assessment & Plan Note (Signed)
Vaginal bleeding/spotting as outlined in previous note.  Saw GYN.  On metrogel regimen for 6 months total.  Continue to f/u with gyn.  Pap today.

## 2013-09-15 NOTE — Assessment & Plan Note (Signed)
Sees Dr Tami Ribas.  Receiving allergy injections.  Follow.

## 2013-09-15 NOTE — Progress Notes (Signed)
Subjective:    Patient ID: Melissa Higgins, female    DOB: December 25, 1975, 38 y.o.   MRN: [id number]  HPI 38 year old female with past history of reoccurring allergies and sinus issues who comes in today to follow up on these issues as well as for a complete physical exam.  She has been under increased stress lately with family/relationship issues.  Is divorced now.  Feels she is handling things relatively well.  Overall doing better handling the stress.  In a new relationship and this is going well.  Exercising.  Breathing stable.  Bowels stable.  States her periods are regular.  She was evaluated recently for persistent intermittent vaginal bleeding.  Saw Dr  Marcelline Mates.  Was initially treated with Diflucan and Flagyl.  Now using metrogel 2x/week for 6 months.  Ultrasound revealed a fibroid.  LMP two weeks ago.   No abdominal pain or cramping.  No fever or chills.  No urinary symptoms.  She also is still having problems sleeping.  She took the trazdone prn.  Never took on a regular basis.  Did tolerate.  She also had questions about pregnancy.  No desire at this time for pregnancy.     Past Medical History  Diagnosis Date  . Hypercholesterolemia   . Environmental allergies     allergy shots  . Dysmenorrhea   . C. difficile colitis     augment use  . History of frequent urinary tract infections     Current Outpatient Prescriptions on File Prior to Visit  Medication Sig Dispense Refill  . AZURETTE 0.15-0.02/0.01 MG (21/5) tablet TAKE 1 TABLET BY MOUTH EVERY DAY  28 tablet  11  . metroNIDAZOLE (METROGEL VAGINAL) 0.75 % vaginal gel Place 1 Applicatorful vaginally 2 (two) times daily. For 5 days.  70 g  0  . traZODone (DESYREL) 50 MG tablet TAKE 1/2 TO 1 TABLET BY MOUTH EVERY NIGHT AT BEDTIME AS NEEDED FOR SLEEP  90 tablet  0   No current facility-administered medications on file prior to visit.    Review of Systems Patient denies any headache, lightheadedness or dizziness.   No increased  shortness of breath, cough or congestion.  No nausea or vomiting.  No acid reflux.  No abdominal pain or cramping.  No bowel change, such as diarrhea, constipation, BRBPR or melana.  No urine change.  Previous vaginal bleeding.  Saw Dr Marcelline Mates.  Using metrogel now.  Trouble sleeping as outlined.         Objective:   Physical Exam  Filed Vitals:   09/10/13 1535  BP: 120/70  Pulse: 65  Temp: 98.3 F (58.51 C)   38 year old female in no acute distress.   HEENT:  Nares- clear.  Oropharynx - without lesions. NECK:  Supple.  Nontender.  No audible bruit.  HEART:  Appears to be regular. LUNGS:  No crackles or wheezing audible.  Respirations even and unlabored.  RADIAL PULSE:  Equal bilaterally.    BREASTS:  No nipple discharge or nipple retraction present.  Could not appreciate any distinct nodules or axillary adenopathy.  ABDOMEN:  Soft, nontender.  Bowel sounds present and normal.  No audible abdominal bruit.  GU:  Normal external genitalia.  Vaginal vault without lesions.  Cervix identified.  Pap performed. Could not appreciate any adnexal masses or tenderness.   RECTAL:  Not performed.     EXTREMITIES:  No increased edema present.  DP pulses palpable and equal bilaterally.  Assessment & Plan:  INCREASED PSYCHOSOCIAL STRESSORS.  Feels she is handling things relatively well.  Follow. Was seeing Ellin Saba.  Follow.     HEADACHES.  Better.  Follow.    THYROID FULLNESS.  Ultrasound (thyroid) 03/28/07 - unremarkable.  Follow.  .    HEALTH MAINTENANCE.  Physical today.  PAP today.  Needs baseline mammogram.    I spent 25 minutes with the patient and more than 50% of the time was spent in consultation regarding the above.

## 2013-09-17 ENCOUNTER — Encounter: Payer: Self-pay | Admitting: Internal Medicine

## 2013-09-17 LAB — CYTOLOGY - PAP

## 2013-09-18 ENCOUNTER — Encounter: Payer: Self-pay | Admitting: *Deleted

## 2013-09-26 ENCOUNTER — Other Ambulatory Visit (INDEPENDENT_AMBULATORY_CARE_PROVIDER_SITE_OTHER): Payer: BC Managed Care – PPO

## 2013-09-26 DIAGNOSIS — Z13220 Encounter for screening for lipoid disorders: Secondary | ICD-10-CM

## 2013-09-26 DIAGNOSIS — G479 Sleep disorder, unspecified: Secondary | ICD-10-CM

## 2013-09-26 DIAGNOSIS — N939 Abnormal uterine and vaginal bleeding, unspecified: Secondary | ICD-10-CM

## 2013-09-26 DIAGNOSIS — N898 Other specified noninflammatory disorders of vagina: Secondary | ICD-10-CM

## 2013-09-26 LAB — CBC WITH DIFFERENTIAL/PLATELET
BASOS PCT: 0.6 % (ref 0.0–3.0)
Basophils Absolute: 0 10*3/uL (ref 0.0–0.1)
EOS ABS: 0.2 10*3/uL (ref 0.0–0.7)
Eosinophils Relative: 3.4 % (ref 0.0–5.0)
HCT: 42.6 % (ref 36.0–46.0)
Hemoglobin: 14.3 g/dL (ref 12.0–15.0)
Lymphocytes Relative: 46.8 % — ABNORMAL HIGH (ref 12.0–46.0)
Lymphs Abs: 3.1 10*3/uL (ref 0.7–4.0)
MCHC: 33.5 g/dL (ref 30.0–36.0)
MCV: 94 fl (ref 78.0–100.0)
Monocytes Absolute: 0.4 10*3/uL (ref 0.1–1.0)
Monocytes Relative: 6.8 % (ref 3.0–12.0)
NEUTROS PCT: 42.4 % — AB (ref 43.0–77.0)
Neutro Abs: 2.8 10*3/uL (ref 1.4–7.7)
Platelets: 394 10*3/uL (ref 150.0–400.0)
RBC: 4.53 Mil/uL (ref 3.87–5.11)
RDW: 12.6 % (ref 11.5–15.5)
WBC: 6.6 10*3/uL (ref 4.0–10.5)

## 2013-09-26 LAB — COMPREHENSIVE METABOLIC PANEL
ALBUMIN: 3.5 g/dL (ref 3.5–5.2)
ALT: 13 U/L (ref 0–35)
AST: 22 U/L (ref 0–37)
Alkaline Phosphatase: 65 U/L (ref 39–117)
BUN: 24 mg/dL — AB (ref 6–23)
CALCIUM: 8.9 mg/dL (ref 8.4–10.5)
CHLORIDE: 104 meq/L (ref 96–112)
CO2: 28 mEq/L (ref 19–32)
Creatinine, Ser: 0.9 mg/dL (ref 0.4–1.2)
GFR: 73.95 mL/min (ref >60.00)
Glucose, Bld: 85 mg/dL (ref 70–99)
Potassium: 4.3 mEq/L (ref 3.5–5.1)
SODIUM: 138 meq/L (ref 135–145)
TOTAL PROTEIN: 7 g/dL (ref 6.0–8.3)
Total Bilirubin: 0.9 mg/dL (ref 0.2–1.2)

## 2013-09-26 LAB — LIPID PANEL
CHOL/HDL RATIO: 4
CHOLESTEROL: 208 mg/dL — AB (ref 0–200)
HDL: 56.4 mg/dL (ref >39.00)
LDL CALC: 137 mg/dL — AB (ref 0–99)
NonHDL: 151.6
Triglycerides: 73 mg/dL (ref 0.0–149.0)
VLDL: 14.6 mg/dL (ref 0.0–40.0)

## 2013-09-26 LAB — TSH: TSH: 1.07 u[IU]/mL (ref 0.35–4.50)

## 2013-09-27 ENCOUNTER — Encounter: Payer: Self-pay | Admitting: Internal Medicine

## 2013-10-31 ENCOUNTER — Ambulatory Visit: Payer: Self-pay | Admitting: Internal Medicine

## 2013-10-31 LAB — HM MAMMOGRAPHY: HM MAMMO: NEGATIVE

## 2013-11-01 ENCOUNTER — Encounter: Payer: Self-pay | Admitting: *Deleted

## 2013-11-02 ENCOUNTER — Encounter: Payer: Self-pay | Admitting: Internal Medicine

## 2013-11-06 ENCOUNTER — Telehealth: Payer: Self-pay | Admitting: Internal Medicine

## 2013-11-06 NOTE — Telephone Encounter (Signed)
LMTCB for appt details/msn

## 2013-11-12 ENCOUNTER — Encounter: Payer: Self-pay | Admitting: Internal Medicine

## 2013-11-12 ENCOUNTER — Ambulatory Visit (INDEPENDENT_AMBULATORY_CARE_PROVIDER_SITE_OTHER): Payer: BC Managed Care – PPO | Admitting: Internal Medicine

## 2013-11-12 VITALS — BP 110/70 | HR 74 | Temp 98.1°F | Ht 70.0 in | Wt 196.0 lb

## 2013-11-12 DIAGNOSIS — K219 Gastro-esophageal reflux disease without esophagitis: Secondary | ICD-10-CM

## 2013-11-12 DIAGNOSIS — G479 Sleep disorder, unspecified: Secondary | ICD-10-CM

## 2013-11-12 DIAGNOSIS — Z91048 Other nonmedicinal substance allergy status: Secondary | ICD-10-CM

## 2013-11-12 DIAGNOSIS — Z9109 Other allergy status, other than to drugs and biological substances: Secondary | ICD-10-CM

## 2013-11-12 MED ORDER — PANTOPRAZOLE SODIUM 40 MG PO TBEC: 40.0000 mg | DELAYED_RELEASE_TABLET | Freq: Every day | ORAL | Status: DC

## 2013-11-17 DIAGNOSIS — K219 Gastro-esophageal reflux disease without esophagitis: Secondary | ICD-10-CM | POA: Insufficient documentation

## 2013-11-17 NOTE — Progress Notes (Signed)
  Subjective:    Patient ID: Melissa Higgins, female    DOB: 05/24/1975, 38 y.o.   MRN: [id number]  Gastrophageal Reflux  38 year old female with past history of reoccurring allergies and sinus issues who comes in today for a scheduled follow up.  She has been under increased stress lately .  Feels she is handling things relatively well.  Overall doing better handling the stress.  In a new relationship and this is going well.  Exercising.  Breathing stable.  Bowels stable.  She does report evaluated recently and diagnosed with sinusitis. Took an abx recently  No improvement.  With bronchitis and wheezing.  Placed on steroids.  Using a nasal rinse.  Doing better.  Increased acid reflux.     Past Medical History  Diagnosis Date  . Hypercholesterolemia   . Environmental allergies     allergy shots  . Dysmenorrhea   . C. difficile colitis     augment use  . History of frequent urinary tract infections     Current Outpatient Prescriptions on File Prior to Visit  Medication Sig Dispense Refill  . AZURETTE 0.15-0.02/0.01 MG (21/5) tablet TAKE 1 TABLET BY MOUTH EVERY DAY 28 tablet 11  . traZODone (DESYREL) 50 MG tablet TAKE 1/2 TO 1 TABLET BY MOUTH EVERY NIGHT AT BEDTIME AS NEEDED FOR SLEEP 90 tablet 0   No current facility-administered medications on file prior to visit.    Review of Systems Patient denies any headache, lightheadedness or dizziness.   No increased shortness of breath.  Congestion is better.   No nausea or vomiting.  Acid reflux as outlined.   No abdominal pain or cramping.  No bowel change, such as diarrhea.  Started taking trazodone regularly.  See if helps with sleep.         Objective:   Physical Exam  Filed Vitals:   11/12/13 1638  BP: 110/70  Pulse: 74  Temp: 98.1 F (28.4 C)   38 year old female in no acute distress.   HEENT:  Nares- clear.  Oropharynx - without lesions. NECK:  Supple.  Nontender.  No audible bruit.  HEART:  Appears to be regular. LUNGS:   No crackles or wheezing audible.  Respirations even and unlabored.  RADIAL PULSE:  Equal bilaterally.   ABDOMEN:  Soft, nontender.  Bowel sounds present and normal.  No audible abdominal bruit.    EXTREMITIES:  No increased edema present.  DP pulses palpable and equal bilaterally.          Assessment & Plan:  INCREASED PSYCHOSOCIAL STRESSORS.  Feels she is handling things relatively well.  Follow. Was seeing Ellin Saba.  Follow.     HEADACHES.  Better.  Follow.    THYROID FULLNESS.  Ultrasound (thyroid) 03/28/07 - unremarkable.  Follow.   Environmental allergies With recent cough and congestion.  Had steroids.  Doing better.  Follow.   Difficulty sleeping Now on trazodone regularly.  Follow.   Gastroesophageal reflux disease, esophagitis presence not specified Worsening acid reflux.  Start protonix.  Get her back in soon to reassess.    Marland Kitchen   HEALTH MAINTENANCE.  Physical last visit. Marland Kitchen  PAP  09/10/13 negative with positive HPV.  Repeat pap in 6 months.  Mammogram 10/31/13 - Birads I.

## 2013-11-25 ENCOUNTER — Encounter: Payer: Self-pay | Admitting: Internal Medicine

## 2013-11-25 DIAGNOSIS — K219 Gastro-esophageal reflux disease without esophagitis: Secondary | ICD-10-CM

## 2013-11-26 ENCOUNTER — Encounter: Payer: Self-pay | Admitting: Internal Medicine

## 2013-11-26 NOTE — Telephone Encounter (Signed)
Order placed for referral to GI 

## 2013-12-24 ENCOUNTER — Other Ambulatory Visit: Payer: Self-pay | Admitting: *Deleted

## 2013-12-24 ENCOUNTER — Other Ambulatory Visit: Payer: Self-pay | Admitting: Internal Medicine

## 2013-12-24 MED ORDER — TRAZODONE HCL 50 MG PO TABS: ORAL_TABLET | ORAL | Status: DC

## 2014-01-14 ENCOUNTER — Encounter: Payer: Self-pay | Admitting: Internal Medicine

## 2014-01-14 ENCOUNTER — Ambulatory Visit (INDEPENDENT_AMBULATORY_CARE_PROVIDER_SITE_OTHER): Payer: BC Managed Care – PPO | Admitting: Internal Medicine

## 2014-01-14 VITALS — BP 118/70 | HR 74 | Temp 98.0°F | Ht 70.0 in | Wt 196.5 lb

## 2014-01-14 DIAGNOSIS — N939 Abnormal uterine and vaginal bleeding, unspecified: Secondary | ICD-10-CM

## 2014-01-14 DIAGNOSIS — R351 Nocturia: Secondary | ICD-10-CM

## 2014-01-14 DIAGNOSIS — M25552 Pain in left hip: Secondary | ICD-10-CM

## 2014-01-14 DIAGNOSIS — G479 Sleep disorder, unspecified: Secondary | ICD-10-CM

## 2014-01-14 DIAGNOSIS — N3000 Acute cystitis without hematuria: Secondary | ICD-10-CM

## 2014-01-14 MED ORDER — TRAZODONE HCL 50 MG PO TABS: ORAL_TABLET | ORAL | Status: DC

## 2014-01-14 NOTE — Progress Notes (Signed)
Pre visit review using our clinic review tool, if applicable. No additional management support is needed unless otherwise documented below in the visit note. 

## 2014-01-15 ENCOUNTER — Encounter: Payer: Self-pay | Admitting: Internal Medicine

## 2014-01-15 LAB — URINALYSIS, ROUTINE W REFLEX MICROSCOPIC
KETONES UR: NEGATIVE
LEUKOCYTES UA: NEGATIVE
Nitrite: NEGATIVE
TOTAL PROTEIN, URINE-UPE24: NEGATIVE
Urine Glucose: NEGATIVE
Urobilinogen, UA: 0.2 (ref 0.0–1.0)
pH: 6 (ref 5.0–8.0)

## 2014-01-15 LAB — CULTURE, URINE COMPREHENSIVE
Colony Count: NO GROWTH
Organism ID, Bacteria: NO GROWTH

## 2014-01-19 ENCOUNTER — Encounter: Payer: Self-pay | Admitting: Internal Medicine

## 2014-01-19 DIAGNOSIS — N390 Urinary tract infection, site not specified: Secondary | ICD-10-CM | POA: Insufficient documentation

## 2014-01-19 NOTE — Progress Notes (Signed)
Subjective:    Patient ID: Melissa Higgins, female    DOB: 1975/08/20, 39 y.o.   MRN: [id number]  HPI 39 year old female with past history of reoccurring allergies and sinus issues who comes in today for a scheduled follow up.   She has been under increased stress lately with family/relationship issues.  Is divorced now.  Feels she is handling things relatively well.  Sleeping some better with trazodone.  Still feels she needs something more.   Not exercising.  Plans to start.  Breathing stable.  Bowels stable.  States her periods are regular.  She was evaluated recently for persistent intermittent vaginal bleeding.  Saw Dr  Marcelline Mates.  Ultrasound revealed a fibroid.   No abdominal pain or cramping.  No fever or chills.  No urinary symptoms.  She also is still having problems sleeping.  Is better, but still feel needs something more.  Started her period today.      Past Medical History  Diagnosis Date  . Hypercholesterolemia   . Environmental allergies     allergy shots  . Dysmenorrhea   . C. difficile colitis     augment use  . History of frequent urinary tract infections     Current Outpatient Prescriptions on File Prior to Visit  Medication Sig Dispense Refill  . AZURETTE 0.15-0.02/0.01 MG (21/5) tablet TAKE 1 TABLET BY MOUTH EVERY DAY 28 tablet 11  . pantoprazole (PROTONIX) 40 MG tablet Take 1 tablet (40 mg total) by mouth daily. 30 tablet 2  . phenylephrine (SUDAFED PE) 10 MG TABS tablet Take 10 mg by mouth daily.     No current facility-administered medications on file prior to visit.    Review of Systems Patient denies any headache, lightheadedness or dizziness.   No increased shortness of breath, cough or congestion.  No nausea or vomiting.  No acid reflux.  No abdominal pain or cramping.  No bowel change, such as diarrhea, constipation, BRBPR or melana.   Previous vaginal bleeding.  Saw Dr Marcelline Mates. has a fibroid.  Now on diflucan one time per week. Trouble sleeping as outlined.    Is better.  Needs to adjust the trazodone.   She has had recent uti.  On AZO now.  Some nocturia.       Objective:   Physical Exam  Filed Vitals:   01/14/14 1606  BP: 118/70  Pulse: 74  Temp: 98 F (53.89 C)   39 year old female in no acute distress.   HEENT:  Nares- clear.  Oropharynx - without lesions. NECK:  Supple.  Nontender.  No audible bruit.  HEART:  Appears to be regular. LUNGS:  No crackles or wheezing audible.  Respirations even and unlabored.  RADIAL PULSE:  Equal bilaterally.  ABDOMEN:  Soft, nontender.  Bowel sounds present and normal.  No audible abdominal bruit. BACK:  Non tender.  No CVA tenderness.      EXTREMITIES:  No increased edema present.  DP pulses palpable and equal bilaterally.          Assessment & Plan:  1. Nocturia Has been recently treated for uti.  Check for clearance.   - CULTURE, URINE COMPREHENSIVE - Urinalysis, Routine w reflex microscopic  2. Vaginal bleeding Saw Dr Marcelline Mates.  Followed by gyn.  Had ultrasound.  Fibroid present.   3. Difficulty sleeping Better.  Feels needs something more.  Increase trazodone to 50mg  1 1/2 tablet per dya.  Follow.    4. Left hip pain Better when  she moves and is more active.  Worse after sitting.  She plans to start exercising more.  Follow.    5. Acute cystitis without hematuria Recently treated.  Check urine for clearance.    6. INCREASED PSYCHOSOCIAL STRESSORS.  Feels she is handling things relatively well.  Follow. Was seeing Ellin Saba.  Follow.     7. THYROID FULLNESS.  Ultrasound (thyroid) 03/28/07 - unremarkable.  Follow.  .    HEALTH MAINTENANCE.  Physical 09/10/13.  PAP 09/10/13 - negative pap with positive HPV.  Needs f/u pap at next visit.  Mammogram 10/31/13 - Birads I.    I spent 25 minutes with the patient and more than 50% of the time was spent in consultation regarding the above.

## 2014-01-24 ENCOUNTER — Encounter: Payer: Self-pay | Admitting: Internal Medicine

## 2014-02-17 ENCOUNTER — Other Ambulatory Visit: Payer: Self-pay | Admitting: Internal Medicine

## 2014-02-17 ENCOUNTER — Encounter: Payer: Self-pay | Admitting: Internal Medicine

## 2014-02-17 DIAGNOSIS — G479 Sleep disorder, unspecified: Secondary | ICD-10-CM

## 2014-02-17 NOTE — Telephone Encounter (Signed)
Order placed for neuro referral.

## 2014-02-18 ENCOUNTER — Other Ambulatory Visit: Payer: Self-pay | Admitting: Internal Medicine

## 2014-03-04 ENCOUNTER — Encounter: Payer: Self-pay | Admitting: Internal Medicine

## 2014-03-06 ENCOUNTER — Ambulatory Visit (INDEPENDENT_AMBULATORY_CARE_PROVIDER_SITE_OTHER): Payer: BC Managed Care – PPO | Admitting: Nurse Practitioner

## 2014-03-06 ENCOUNTER — Encounter: Payer: Self-pay | Admitting: Nurse Practitioner

## 2014-03-06 VITALS — BP 116/68 | HR 76 | Temp 98.2°F | Resp 12 | Ht 70.5 in | Wt 185.0 lb

## 2014-03-06 DIAGNOSIS — G479 Sleep disorder, unspecified: Secondary | ICD-10-CM

## 2014-03-06 DIAGNOSIS — B9789 Other viral agents as the cause of diseases classified elsewhere: Secondary | ICD-10-CM

## 2014-03-06 DIAGNOSIS — B349 Viral infection, unspecified: Secondary | ICD-10-CM

## 2014-03-06 DIAGNOSIS — J329 Chronic sinusitis, unspecified: Secondary | ICD-10-CM

## 2014-03-06 NOTE — Patient Instructions (Signed)
After 5 days contact me through MyChart with the name of the antibiotic if you are worsening or still having symptoms.   Continue with your current regimen.

## 2014-03-06 NOTE — Progress Notes (Signed)
Subjective:    Patient ID: Melissa Higgins, female    DOB: [birth year], 39 y.o.   MRN: [id number]  HPI  Melissa Higgins is a 39 yo female with a CC of fatigue and headaches x 5 days.   1) Squeezing headaches since Sunday, fatigue, and frontal pressure Allergy shots last episode 1 month ago  Humidifier Sudafed Antihistamine Nasal rinses and saline- improvement  Sept. Two rounds of antibiotics  2) Sleep study next week- exhuasted when wakes up   Review of Systems  Constitutional: Positive for fatigue. Negative for fever, chills and diaphoresis.  HENT: Positive for rhinorrhea and sinus pressure.        Frontal  Respiratory: Negative for chest tightness, shortness of breath and wheezing.   Cardiovascular: Negative for chest pain, palpitations and leg swelling.  Gastrointestinal: Negative for nausea, vomiting and diarrhea.  Skin: Negative for rash.  Neurological: Positive for headaches. Negative for dizziness, weakness and numbness.  Psychiatric/Behavioral: Positive for sleep disturbance. The patient is not nervous/anxious.    Past Medical History  Diagnosis Date  . Hypercholesterolemia   . Environmental allergies     allergy shots  . Dysmenorrhea   . C. difficile colitis     augment use  . History of frequent urinary tract infections     History   Social History  . Marital Status: Single    Spouse Name: N/A  . Number of Children: N/A  . Years of Education: N/A   Occupational History  . Not on file.   Social History Main Topics  . Smoking status: Never Smoker   . Smokeless tobacco: Never Used  . Alcohol Use: 0.0 oz/week    0 Standard drinks or equivalent per week     Comment: occasional  . Drug Use: No  . Sexual Activity: Not on file   Other Topics Concern  . Not on file   Social History Narrative    Past Surgical History  Procedure Laterality Date  . Nasal sinus surgery  2003    Family History  Problem Relation Age of Onset  . Hyperlipidemia Mother    . Asthma Father   . Hyperlipidemia Father   . Allergies Father     Allergies  Allergen Reactions  . Augmentin [Amoxicillin-Pot Clavulanate] Diarrhea  . Macrobid [Nitrofurantoin Macrocrystal]     Current Outpatient Prescriptions on File Prior to Visit  Medication Sig Dispense Refill  . AZURETTE 0.15-0.02/0.01 MG (21/5) tablet TAKE 1 TABLET BY MOUTH EVERY DAY. 84 tablet 3  . pantoprazole (PROTONIX) 40 MG tablet Take 1 tablet (40 mg total) by mouth daily. 30 tablet 2  . phenylephrine (SUDAFED PE) 10 MG TABS tablet Take 10 mg by mouth daily.    . traZODone (DESYREL) 50 MG tablet TAKE 1 1/2 TABLET BY MOUTH EVERY NIGHT AT BEDTIME AS NEEDED FOR SLEEP 45 tablet 1   No current facility-administered medications on file prior to visit.       Objective:   Physical Exam  Constitutional: She is oriented to person, place, and time. She appears well-developed and well-nourished. No distress.  BP 116/68 mmHg  Pulse 76  Temp(Src) 98.2 F (36.8 C)  Resp 12  Ht 5' 10.5" (1.791 m)  Wt 185 lb (83.915 kg)  BMI 26.16 kg/m2  SpO2 96%   HENT:  Head: Normocephalic and atraumatic.  Right Ear: External ear normal.  Left Ear: External ear normal.  Mouth/Throat: No oropharyngeal exudate.  TM's clear bilaterally  Eyes: Conjunctivae and EOM are normal.  Pupils are equal, round, and reactive to light. Right eye exhibits no discharge. Left eye exhibits no discharge. No scleral icterus.  Neck: Normal range of motion. Neck supple. No thyromegaly present.  Cardiovascular: Normal rate, regular rhythm, normal heart sounds and intact distal pulses.  Exam reveals no gallop and no friction rub.   No murmur heard. Pulmonary/Chest: Effort normal and breath sounds normal. No respiratory distress. She has no wheezes. She has no rales. She exhibits no tenderness.  Lymphadenopathy:    She has no cervical adenopathy.  Neurological: She is alert and oriented to person, place, and time. No cranial nerve deficit. She  exhibits normal muscle tone. Coordination normal.  Skin: Skin is warm and dry. No rash noted. She is not diaphoretic.  Psychiatric: She has a normal mood and affect. Her behavior is normal. Judgment and thought content normal.      Assessment & Plan:

## 2014-03-10 ENCOUNTER — Encounter: Payer: Self-pay | Admitting: Nurse Practitioner

## 2014-03-10 DIAGNOSIS — J329 Chronic sinusitis, unspecified: Secondary | ICD-10-CM | POA: Insufficient documentation

## 2014-03-10 NOTE — Assessment & Plan Note (Signed)
Pt probable viral sinusitis. Continue with conservative management with humidifier, sudafed, generic anti-histamine, and nasal rinses. Call next week if not improved.

## 2014-03-10 NOTE — Assessment & Plan Note (Signed)
Sleep study scheduled for next week.

## 2014-03-11 ENCOUNTER — Encounter: Payer: Self-pay | Admitting: Neurology

## 2014-03-11 ENCOUNTER — Other Ambulatory Visit: Payer: Self-pay | Admitting: Nurse Practitioner

## 2014-03-11 ENCOUNTER — Ambulatory Visit (INDEPENDENT_AMBULATORY_CARE_PROVIDER_SITE_OTHER): Payer: BC Managed Care – PPO | Admitting: Neurology

## 2014-03-11 VITALS — BP 112/68 | HR 66 | Resp 16 | Ht 70.0 in | Wt 184.0 lb

## 2014-03-11 DIAGNOSIS — R0683 Snoring: Secondary | ICD-10-CM

## 2014-03-11 DIAGNOSIS — G4700 Insomnia, unspecified: Secondary | ICD-10-CM | POA: Diagnosis not present

## 2014-03-11 MED ORDER — LEVOFLOXACIN 750 MG PO TABS: 750.0000 mg | ORAL_TABLET | Freq: Every day | ORAL | Status: DC

## 2014-03-11 MED ORDER — ESZOPICLONE 2 MG PO TABS: 2.0000 mg | ORAL_TABLET | Freq: Every evening | ORAL | Status: DC | PRN

## 2014-03-11 NOTE — Progress Notes (Signed)
SLEEP MEDICINE CLINIC   Provider:  Larey Seat, M D  Referring Provider: Einar Pheasant, MD Primary Care Physician:  Einar Pheasant, MD  Chief Complaint  Patient presents with  . Sleep consult    RM 10  - Vision 20/30 with glasses    HPI:  Melissa Higgins is a 39 y.o. female , seen here as a referral from Dr. Nicki Reaper for primary  Insomnia.  Melissa Higgins reports that she has been not sleeping well even when taking sleep inducing medications. Sometimes she can sleep but wakes up exhausted and neither refreshed nor restored. She feels that all day she has a decreased level of energy she's excessively daytime sleepy and she sometimes wakes up with a headache. She also suffers from allergies which may affect the ability to breathe through the nose. She has had frequent environmental allergies since living in [place], and in the year 2002 underwent a sinus surgery.  The patient tries to a tear to a certain routine related to her sleep habits. She goes to bed around 10 PM, she can fall asleep fairly promptly since taking trazodone. She will frequently wake up however, she had cystitis and has had frequent bathroom breaks at night. She is currently on fluconazole, Sudafed as needed, loratadine, trazodone. She's also on a birth control pill, as directed. He has been told by friends that she sometimes snores but she has never been observed having apnea. She  seems not to routinely to snore and she is not sure if it is situational or positional dependent. Her sleep is recently full of vivid dreams. "Weird".  She may wake up after 8 hours of nocturnal sleep and still feel exhausted. She endorsed the fatigue severity scale at 47 points and the Epworth Sleepiness Scale at 7 points. She feels tired but not to the level that she will have a sleep attack. There is no irresistible urge to fall asleep. The patient has not experienced sleep paralysis, hypnagogic or hypnopompic hallucinations, and she  has not experienced cataplectic attacks.  Before the patient was placed on trazodone she had tried Ambien, and is not sure why it was discontinued. She has not tried [place] or [employer]. I addressed with the patient that she may just have a hangover effect from the trazodone. Him she has never tried a Tylenol PM or any other Benadryl like over-the-counter medication has not taken melatonin, has not used unisom nor any SSRI.     Review of Systems: Out of a complete 14 system review, the patient complains of only the following symptoms, and all other reviewed systems are negative. The main concern is not sleeping through the night unless on medication, a high degree of fatigue and some snoring which may not be the cause for fatigue. There was no excessive daytime sleepiness endorsed.  Epworth score 7 , Fatigue severity score 47  , depression score 0   History   Social History  . Marital Status: Single    Spouse Name: N/A  . Number of Children: 0  . Years of Education: college   Occupational History  . Not on file.   Social History Main Topics  . Smoking status: Never Smoker   . Smokeless tobacco: Never Used  . Alcohol Use: 0.0 oz/week    0 Standard drinks or equivalent per week     Comment: occasional  . Drug Use: No  . Sexual Activity: Not on file   Other Topics Concern  . Not on file  Social History Narrative   Patient lives at home alone. 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    Family History  Problem Relation Age of Onset  . Hyperlipidemia Mother   . Asthma Father   . Hyperlipidemia Father   . Allergies Father     Past Medical History  Diagnosis Date  . Hypercholesterolemia   . Environmental allergies     allergy shots  . Dysmenorrhea   . C. difficile colitis     augment use  . History of frequent urinary tract infections     Past Surgical History  Procedure Laterality Date  . Nasal sinus surgery  2003     Current Outpatient Prescriptions  Medication Sig Dispense Refill  . AZURETTE 0.15-0.02/0.01 MG (21/5) tablet TAKE 1 TABLET BY MOUTH EVERY DAY. 84 tablet 3  . levofloxacin (LEVAQUIN) 750 MG tablet Take 1 tablet (750 mg total) by mouth daily. 5 tablet 0  . pantoprazole (PROTONIX) 40 MG tablet Take 1 tablet (40 mg total) by mouth daily. 30 tablet 2  . phenylephrine (SUDAFED PE) 10 MG TABS tablet Take 10 mg by mouth daily.    . traZODone (DESYREL) 50 MG tablet TAKE 1 1/2 TABLET BY MOUTH EVERY NIGHT AT BEDTIME AS NEEDED FOR SLEEP 45 tablet 1   No current facility-administered medications for this visit.    Allergies as of 03/11/2014 - Review Complete 03/11/2014  Allergen Reaction Noted  . Augmentin [amoxicillin-pot clavulanate] Diarrhea 01/31/2012  . Macrobid [nitrofurantoin macrocrystal]  01/31/2012  . Nitrofurantoin Other (See Comments) 03/11/2014    Vitals: BP 112/68 mmHg  Pulse 66  Resp 16  Ht 5\' 10"  (1.778 m)  Wt 184 lb (83.462 kg)  BMI 26.40 kg/m2 Last Weight:  Wt Readings from Last 1 Encounters:  03/11/14 184 lb (83.462 kg)       Last Height:   Ht Readings from Last 1 Encounters:  03/11/14 5\' 10"  (1.778 m)    Physical exam:  General: The patient is awake, alert and appears not in acute distress. The patient is well groomed. Head: Normocephalic, atraumatic. Neck is supple. Mallampati 3   neck circumference:14.5 . Nasal airflow unrestrcited  TMJclick on the right only . Retrognathia is not een.  Cardiovascular:  Regular rate and rhythm , without  murmurs or carotid bruit, and without distended neck veins. Respiratory: Lungs are clear to auscultation. Skin:  Without evidence of edema, or rash Trunk: normal posture.  Neurologic exam : The patient is awake and alert, oriented to place and time.   Memory subjective described as intact. There is a normal attention span & concentration ability.  Speech is fluent without dysarthria, dysphonia or aphasia. Mood and affect  are appropriate.  Cranial nerves: Pupils are equal and briskly reactive to light. Funduscopic exam without  evidence of pallor or edema.  Extraocular movements  in vertical and horizontal planes intact and without nystagmus. Visual fields by finger perimetry are intact. Hearing to finger rub intact.  Facial sensation intact to fine touch. Facial motor strength is symmetric and tongue and uvula move midline.  Motor exam:  Normal tone, muscle bulk and symmetricstrength in all extremities.  Sensory:  Fine touch, pinprick and vibration were tested in all extremities.  Proprioception is  normal.  Coordination: Rapid alternating movements in the fingers/hands is normal. Finger-to-nose maneuver  normal without evidence of ataxia, dysmetria or tremor.  Gait and station: Patient walks without assistive device and is able unassisted  to climb up to the exam table. Strength within normal limits.  Deep tendon reflexes: in the  upper and lower extremities are symmetric and intact. Babinski maneuver response is downgoing.   Assessment:  After physical and neurologic examination, review of laboratory studies, imaging, neurophysiology testing and pre-existing records, assessment is   Melissa Higgins has very few risk factors for obstructive sleep apnea or central apnea. She has a normal body mass index she is a regular exerciser with good muscle tone, her nasal passages unrestricted she is not forced to breathe through her mouth. Mallampati is a to she still has tonsils and they're mildly irritated but not swollen to a degree with a restrictor airway at all. The snoring that has been witnessed has not been reported to her on a regular basis and may be related to alcohol conservation at dinnertime. She's not a regular drinker of alcohol. She is not a smoker either.Her bed rooom is cool quiet and dark, no TV in the bedroom. It is equipped with a humidifier. She r takes Vit D.  I think that a sleep study at this time  has a low yield to explain what is going on with Melissa Higgins and I would much rather try some sleep aids that are not addictive and see if they work to a degree where she feels actually that her sleep is refreshing and restoring for her. Ambien caused her to sleep talk and did not feel as if she slept at all. I will try Lunesta first.  If this does not work well, try Belsomra.    The patient was advised of the nature of the diagnosed sleep disorder , the treatment options and risks for general a health and wellness arising from not treating the condition. Visit duration was 1minutes.   Plan:  Treatment plan and additional workup :  Lunesta 2 mg tabs 30 , prn at night,   RV in 30 days with me or NP, Epworth, FSS and consider PSG.      Asencion Partridge Janiyah Beery MD  03/11/2014

## 2014-03-11 NOTE — Patient Instructions (Signed)
Insomnia Insomnia is frequent trouble falling and/or staying asleep. Insomnia can be a long term problem or a short term problem. Both are common. Insomnia can be a short term problem when the wakefulness is related to a certain stress or worry. Long term insomnia is often related to ongoing stress during waking hours and/or poor sleeping habits. Overtime, sleep deprivation itself can make the problem worse. Every little thing feels more severe because you are overtired and your ability to cope is decreased. CAUSES   Stress, anxiety, and depression.  Poor sleeping habits.  Distractions such as TV in the bedroom.  Naps close to bedtime.  Engaging in emotionally charged conversations before bed.  Technical reading before sleep.  Alcohol and other sedatives. They may make the problem worse. They can hurt normal sleep patterns and normal dream activity.  Stimulants such as caffeine for several hours prior to bedtime.  Pain syndromes and shortness of breath can cause insomnia.  Exercise late at night.  Changing time zones may cause sleeping problems (jet lag). It is sometimes helpful to have someone observe your sleeping patterns. They should look for periods of not breathing during the night (sleep apnea). They should also look to see how long those periods last. If you live alone or observers are uncertain, you can also be observed at a sleep clinic where your sleep patterns will be professionally monitored. Sleep apnea requires a checkup and treatment. Give your caregivers your medical history. Give your caregivers observations your family has made about your sleep.  SYMPTOMS   Not feeling rested in the morning.  Anxiety and restlessness at bedtime.  Difficulty falling and staying asleep. TREATMENT   Your caregiver may prescribe treatment for an underlying medical disorders. Your caregiver can give advice or help if you are using alcohol or other drugs for self-medication. Treatment  of underlying problems will usually eliminate insomnia problems.  Medications can be prescribed for short time use. They are generally not recommended for lengthy use.  Over-the-counter sleep medicines are not recommended for lengthy use. They can be habit forming.  You can promote easier sleeping by making lifestyle changes such as:  Using relaxation techniques that help with breathing and reduce muscle tension.  Exercising earlier in the day.  Changing your diet and the time of your last meal. No night time snacks.  Establish a regular time to go to bed.  Counseling can help with stressful problems and worry.  Soothing music and white noise may be helpful if there are background noises you cannot remove.  Stop tedious detailed work at least one hour before bedtime. HOME CARE INSTRUCTIONS   Keep a diary. Inform your caregiver about your progress. This includes any medication side effects. See your caregiver regularly. Take note of:  Times when you are asleep.  Times when you are awake during the night.  The quality of your sleep.  How you feel the next day. This information will help your caregiver care for you.  Get out of bed if you are still awake after 15 minutes. Read or do some quiet activity. Keep the lights down. Wait until you feel sleepy and go back to bed.  Keep regular sleeping and waking hours. Avoid naps.  Exercise regularly.  Avoid distractions at bedtime. Distractions include watching television or engaging in any intense or detailed activity like attempting to balance the household checkbook.  Develop a bedtime ritual. Keep a familiar routine of bathing, brushing your teeth, climbing into bed at the same   time each night, listening to soothing music. Routines increase the success of falling to sleep faster.  Use relaxation techniques. This can be using breathing and muscle tension release routines. It can also include visualizing peaceful scenes. You can  also help control troubling or intruding thoughts by keeping your mind occupied with boring or repetitive thoughts like the old concept of counting sheep. You can make it more creative like imagining planting one beautiful flower after another in your backyard garden.  During your day, work to eliminate stress. When this is not possible use some of the previous suggestions to help reduce the anxiety that accompanies stressful situations. MAKE SURE YOU:   Understand these instructions.  Will watch your condition.  Will get help right away if you are not doing well or get worse. Document Released: 12/25/1999 Document Revised: 03/21/2011 Document Reviewed: 01/24/2007 ExitCare Patient Information 2015 [employer]. This information is not intended to replace advice given to you by your health care provider. Make sure you discuss any questions you have with your health care provider.  

## 2014-03-13 ENCOUNTER — Telehealth: Payer: Self-pay | Admitting: Neurology

## 2014-03-13 NOTE — Telephone Encounter (Signed)
Patient stated Prior Authorization is needed for Rx eszopiclone (LUNESTA) 2 MG TABS tablet.  Please call and advise.

## 2014-03-13 NOTE — Telephone Encounter (Signed)
All required info has been provided to ins.  Request is currently under review.  I called the patient back.  Got no answer.  Left message.

## 2014-03-18 ENCOUNTER — Ambulatory Visit: Payer: BC Managed Care – PPO | Admitting: Internal Medicine

## 2014-03-19 ENCOUNTER — Ambulatory Visit: Payer: BC Managed Care – PPO | Admitting: Internal Medicine

## 2014-03-20 ENCOUNTER — Other Ambulatory Visit (HOSPITAL_COMMUNITY)
Admission: RE | Admit: 2014-03-20 | Discharge: 2014-03-20 | Disposition: A | Discharge status: Home / Self Care | Payer: BC Managed Care – PPO | Source: Ambulatory Visit | Attending: Internal Medicine | Admitting: Internal Medicine

## 2014-03-20 ENCOUNTER — Ambulatory Visit (INDEPENDENT_AMBULATORY_CARE_PROVIDER_SITE_OTHER): Payer: BC Managed Care – PPO | Admitting: Internal Medicine

## 2014-03-20 ENCOUNTER — Encounter: Payer: Self-pay | Admitting: Internal Medicine

## 2014-03-20 VITALS — BP 112/72 | HR 78 | Temp 98.1°F | Ht 70.0 in | Wt 183.4 lb

## 2014-03-20 DIAGNOSIS — Z0000 Encounter for general adult medical examination without abnormal findings: Secondary | ICD-10-CM

## 2014-03-20 DIAGNOSIS — Z1151 Encounter for screening for human papillomavirus (HPV): Secondary | ICD-10-CM | POA: Diagnosis present

## 2014-03-20 DIAGNOSIS — G479 Sleep disorder, unspecified: Secondary | ICD-10-CM

## 2014-03-20 DIAGNOSIS — R87619 Unspecified abnormal cytological findings in specimens from cervix uteri: Secondary | ICD-10-CM

## 2014-03-20 DIAGNOSIS — Z01411 Encounter for gynecological examination (general) (routine) with abnormal findings: Secondary | ICD-10-CM | POA: Insufficient documentation

## 2014-03-20 DIAGNOSIS — R87810 Cervical high risk human papillomavirus (HPV) DNA test positive: Secondary | ICD-10-CM | POA: Diagnosis present

## 2014-03-20 DIAGNOSIS — N760 Acute vaginitis: Secondary | ICD-10-CM

## 2014-03-20 DIAGNOSIS — K219 Gastro-esophageal reflux disease without esophagitis: Secondary | ICD-10-CM

## 2014-03-20 DIAGNOSIS — Z124 Encounter for screening for malignant neoplasm of cervix: Secondary | ICD-10-CM

## 2014-03-20 NOTE — Progress Notes (Signed)
Pre visit review using our clinic review tool, if applicable. No additional management support is needed unless otherwise documented below in the visit note. 

## 2014-03-21 ENCOUNTER — Encounter: Payer: Self-pay | Admitting: Internal Medicine

## 2014-03-21 LAB — WET PREP BY MOLECULAR PROBE
CANDIDA SPECIES: NEGATIVE
Gardnerella vaginalis: NEGATIVE
Trichomonas vaginosis: NEGATIVE

## 2014-03-24 ENCOUNTER — Telehealth: Payer: Self-pay

## 2014-03-24 NOTE — Telephone Encounter (Signed)
Unread mychart message mailed to patient 

## 2014-03-24 NOTE — Telephone Encounter (Signed)
[employer] has approved the request for coverage on Eszopiclone effective until 03/14/2015 Ref # [id number]

## 2014-03-25 LAB — CYTOLOGY - PAP

## 2014-03-26 ENCOUNTER — Encounter: Payer: Self-pay | Admitting: Neurology

## 2014-03-26 ENCOUNTER — Encounter: Payer: Self-pay | Admitting: Internal Medicine

## 2014-03-26 DIAGNOSIS — R87610 Atypical squamous cells of undetermined significance on cytologic smear of cervix (ASC-US): Secondary | ICD-10-CM

## 2014-03-26 NOTE — Telephone Encounter (Signed)
Order placed for gyn referral.  

## 2014-03-29 ENCOUNTER — Encounter: Payer: Self-pay | Admitting: Internal Medicine

## 2014-03-29 DIAGNOSIS — R87619 Unspecified abnormal cytological findings in specimens from cervix uteri: Secondary | ICD-10-CM | POA: Insufficient documentation

## 2014-03-29 DIAGNOSIS — N898 Other specified noninflammatory disorders of vagina: Secondary | ICD-10-CM | POA: Insufficient documentation

## 2014-03-29 DIAGNOSIS — Z0000 Encounter for general adult medical examination without abnormal findings: Secondary | ICD-10-CM | POA: Insufficient documentation

## 2014-03-29 DIAGNOSIS — N760 Acute vaginitis: Secondary | ICD-10-CM | POA: Insufficient documentation

## 2014-03-29 NOTE — Assessment & Plan Note (Signed)
Physical 09/10/13.  Mammogram 10/31/13 - Birads I.  F/u pap today.

## 2014-03-29 NOTE — Assessment & Plan Note (Signed)
Just had EGD.  Doing well.  On no medication now.  Follow.

## 2014-03-29 NOTE — Progress Notes (Signed)
Patient ID: Melissa Higgins, female   DOB: 08-12-1975, 39 y.o.   MRN: [id number]   Subjective:    Patient ID: Melissa Higgins, female    DOB: [birth year], 39 y.o.   MRN: [id number]  HPI  Patient here for a scheduled follow up and follow up pap smear.  Was having issues with GERD and dyspepsia.  Saw GI.  Is s/p EGD.  Doing better now.  Not on any medication.  Eating and drinking well.  Stays active.  Breathing stable.  Bowels stable.  Some vaginal discharge.  Needs f/u pap.  Previous HPV positive.  Feels she is handling stress relatively well.  Seeing neurology for sleep issues.  On lunesta.  Sleeping better.  Continues to f/u with neurology.     Past Medical History  Diagnosis Date  . Hypercholesterolemia   . Environmental allergies     allergy shots  . Dysmenorrhea   . C. difficile colitis     augment use  . History of frequent urinary tract infections      Current Outpatient Prescriptions on File Prior to Visit  Medication Sig Dispense Refill  . AZURETTE 0.15-0.02/0.01 MG (21/5) tablet TAKE 1 TABLET BY MOUTH EVERY DAY. 84 tablet 3  . eszopiclone (LUNESTA) 2 MG TABS tablet Take 1 tablet (2 mg total) by mouth at bedtime as needed for sleep. Take immediately before bedtime 30 tablet 1  . phenylephrine (SUDAFED PE) 10 MG TABS tablet Take 10 mg by mouth daily.     No current facility-administered medications on file prior to visit.    Review of Systems  Constitutional: Negative for appetite change and unexpected weight change.  HENT: Negative for congestion and sinus pressure.   Respiratory: Negative for cough, chest tightness and shortness of breath.   Cardiovascular: Negative for chest pain, palpitations and leg swelling.  Gastrointestinal: Negative for nausea, vomiting, abdominal pain and diarrhea.  Genitourinary: Negative for dysuria and difficulty urinating.  Neurological: Negative for dizziness, light-headedness and headaches.  Psychiatric/Behavioral:       Feels she is  handling stress relatively well.         Objective:    Physical Exam  Constitutional: She appears well-developed and well-nourished. No distress.  HENT:  Nose: Nose normal.  Mouth/Throat: Oropharynx is clear and moist.  Neck: Neck supple. No thyromegaly present.  Cardiovascular: Normal rate and regular rhythm.   Pulmonary/Chest: Breath sounds normal. No respiratory distress. She has no wheezes.  Abdominal: Soft. Bowel sounds are normal. There is no tenderness.  Genitourinary:  Normal external genitalia.  Vaginal vault without lesions.  Cervix identified.  Pap smear performed.  Could not appreciate any adnexal masses or tenderness.  Wet prep sent.    Musculoskeletal: She exhibits no edema or tenderness.  Lymphadenopathy:    She has no cervical adenopathy.  Skin: No rash noted. No erythema.    BP 112/72 mmHg  Pulse 78  Temp(Src) 98.1 F (36.7 C) (Oral)  Ht 5\' 10"  (1.778 m)  Wt 183 lb 6 oz (83.178 kg)  BMI 26.31 kg/m2  SpO2 94%  LMP 03/11/2014 Wt Readings from Last 3 Encounters:  03/20/14 183 lb 6 oz (83.178 kg)  03/11/14 184 lb (83.462 kg)  03/06/14 185 lb (83.915 kg)     Lab Results  Component Value Date   WBC 6.6 09/26/2013   HGB 14.3 09/26/2013   HCT 42.6 09/26/2013   PLT 394.0 09/26/2013   GLUCOSE 85 09/26/2013   CHOL 208* 09/26/2013   TRIG 73.0  09/26/2013   HDL 56.40 09/26/2013   LDLCALC 137* 09/26/2013   ALT 13 09/26/2013   AST 22 09/26/2013   NA 138 09/26/2013   K 4.3 09/26/2013   CL 104 09/26/2013   CREATININE 0.9 09/26/2013   BUN 24* 09/26/2013   CO2 28 09/26/2013   TSH 1.07 09/26/2013       Assessment & Plan:   Problem List Items Addressed This Visit    Abnormal Pap smear of cervix    Repeat pap today.  Previous h/o HPV positive pap.        Difficulty sleeping    Seeing neurology.  On lunesta now.  Sleeping better.  Continue to follow up with neurology.        GERD (gastroesophageal reflux disease)    Just had EGD.  Doing well.  On no  medication now.  Follow.        Health care maintenance    Physical 09/10/13.  Mammogram 10/31/13 - Birads I.  F/u pap today.        Vaginitis    KOH/wet prep sent.         Other Visit Diagnoses    Pap smear for cervical cancer screening    -  Primary    Relevant Orders    Cytology - PAP (Completed)    WET PREP BY MOLECULAR PROBE (Completed)      I spent 25 minutes with the patient and more than 50% of the time was spent in consultation regarding the above.     Einar Pheasant, MD

## 2014-03-29 NOTE — Assessment & Plan Note (Signed)
Seeing neurology.  On lunesta now.  Sleeping better.  Continue to follow up with neurology.

## 2014-03-29 NOTE — Assessment & Plan Note (Signed)
KOH/wet prep sent.

## 2014-03-29 NOTE — Assessment & Plan Note (Signed)
Repeat pap today.  Previous h/o HPV positive pap.

## 2014-04-02 ENCOUNTER — Telehealth: Payer: BC Managed Care – PPO | Admitting: Family

## 2014-04-02 DIAGNOSIS — R6889 Other general symptoms and signs: Secondary | ICD-10-CM

## 2014-04-02 NOTE — Progress Notes (Signed)
E visit for Flu like symptoms   We are sorry that you are not feeling well.  Here is how we plan to help! Based on what you have shared with me it looks like you may have flu-like symptoms that should be watched but do not seem to indicate anti-viral treatment.  Influenza or "the flu" is   an infection caused by a respiratory virus. The flu virus is highly contagious and persons who did not receive their yearly flu vaccination may "catch" the flu from close contact.  We have anti-viral medications to treat the viruses that cause this infection. They are not a "cure" and only shorten the course of the infection. These prescriptions are most effective when they are given within the first 2 days of "flu" symptoms. Antiviral medication are indicated if you have a high risk of complications from the flu. You should  also consider an antiviral medication if you are in close contact with someone who is at risk. These medications can help patients avoid complications from the flu  but have side effects that you should know. Possible side effects from Tamiflu or oseltamivir include nausea, vomiting, diarrhea, dizziness, headaches, eye redness, sleep problems or other respiratory symptoms. You should not take Tamiflu if you have an allergy to oseltamivir or any to the ingredients in Tamiflu.  Based upon your symptoms and potential risk factors I recommend that you follow the flu symptoms recommendation that I have listed below.  ANYONE WHO HAS FLU SYMPTOMS SHOULD: . Stay home. The flu is highly contagious and going out or to work exposes others! . Be sure to drink plenty of fluids. Water is fine as well as fruit juices, sodas and electrolyte beverages. You may want to stay away from caffeine or alcohol. If you are nauseated, try taking small sips of liquids. How do you know if you are getting enough fluid? Your urine should be a pale yellow or almost colorless. . Get rest. . Taking a steamy shower or using a  humidifier may help nasal congestion and ease sore throat pain. Using a saline nasal spray works much the same way. . Cough drops, hard candies and sore throat lozenges may ease your cough. . Line up a caregiver. Have someone check on you regularly.   GET HELP RIGHT AWAY IF: . You cannot keep down liquids or your medications. . You become short of breath . Your fell like you are going to pass out or loose consciousness. . Your symptoms persist after you have completed your treatment plan MAKE SURE YOU   Understand these instructions.  Will watch your condition.  Will get help right away if you are not doing well or get worse.  Your e-visit answers were reviewed by a board certified advanced clinical practitioner to complete your personal care plan.  Depending on the condition, your plan could have included both over the counter or prescription medications.  If there is a problem please reply  once you have received a response from your provider.  Your safety is important to [place].  If you have drug allergies check your prescription carefully.    You can use MyChart to ask questions about today's visit, request a non-urgent call back, or ask for a work or school excuse.  You will get an e-mail in the next two days asking about your experience.  I hope that your e-visit has been valuable and will speed your recovery. Thank you for using e-visits.

## 2014-04-08 ENCOUNTER — Telehealth: Payer: Self-pay | Admitting: *Deleted

## 2014-04-08 DIAGNOSIS — E7800 Pure hypercholesterolemia, unspecified: Secondary | ICD-10-CM

## 2014-04-08 NOTE — Telephone Encounter (Signed)
Orders placed for labs

## 2014-04-08 NOTE — Telephone Encounter (Signed)
Pt coming in tomorrow what labs and dx?  

## 2014-04-09 ENCOUNTER — Other Ambulatory Visit (INDEPENDENT_AMBULATORY_CARE_PROVIDER_SITE_OTHER): Payer: BC Managed Care – PPO

## 2014-04-09 ENCOUNTER — Ambulatory Visit (INDEPENDENT_AMBULATORY_CARE_PROVIDER_SITE_OTHER): Payer: BC Managed Care – PPO | Admitting: Neurology

## 2014-04-09 ENCOUNTER — Encounter: Payer: Self-pay | Admitting: Neurology

## 2014-04-09 VITALS — BP 111/63 | HR 70 | Resp 12 | Ht 70.5 in | Wt 174.8 lb

## 2014-04-09 DIAGNOSIS — G4700 Insomnia, unspecified: Secondary | ICD-10-CM | POA: Diagnosis not present

## 2014-04-09 DIAGNOSIS — E7800 Pure hypercholesterolemia, unspecified: Secondary | ICD-10-CM

## 2014-04-09 DIAGNOSIS — J301 Allergic rhinitis due to pollen: Secondary | ICD-10-CM

## 2014-04-09 LAB — LIPID PANEL
Cholesterol: 185 mg/dL (ref 0–200)
HDL: 48 mg/dL (ref >39.00)
LDL CALC: 118 mg/dL — AB (ref 0–99)
NonHDL: 137
Total CHOL/HDL Ratio: 4
Triglycerides: 93 mg/dL (ref 0.0–149.0)
VLDL: 18.6 mg/dL (ref 0.0–40.0)

## 2014-04-09 LAB — COMPREHENSIVE METABOLIC PANEL
ALT: 24 U/L (ref 0–35)
AST: 21 U/L (ref 0–37)
Albumin: 3.6 g/dL (ref 3.5–5.2)
Alkaline Phosphatase: 57 U/L (ref 39–117)
BUN: 17 mg/dL (ref 6–23)
CALCIUM: 9.2 mg/dL (ref 8.4–10.5)
CHLORIDE: 106 meq/L (ref 96–112)
CO2: 27 mEq/L (ref 19–32)
Creatinine, Ser: 0.81 mg/dL (ref 0.40–1.20)
GFR: 83.28 mL/min (ref >60.00)
GLUCOSE: 90 mg/dL (ref 70–99)
Potassium: 3.8 mEq/L (ref 3.5–5.1)
Sodium: 138 mEq/L (ref 135–145)
Total Bilirubin: 0.5 mg/dL (ref 0.2–1.2)
Total Protein: 6.7 g/dL (ref 6.0–8.3)

## 2014-04-09 MED ORDER — ESZOPICLONE 2 MG PO TABS: 2.0000 mg | ORAL_TABLET | Freq: Every day | ORAL | Status: DC

## 2014-04-09 NOTE — Progress Notes (Signed)
[facility]   Provider:  Larey Seat, M D  Referring Provider: Einar Pheasant, MD Primary Care Physician:  Einar Pheasant, MD  Chief Complaint  Patient presents with  . RV Insomnia    Rm 11, alone    HPI:  Melissa Higgins is a 39 y.o. female , seen here as a referral from Dr. Nicki Reaper for primary  Insomnia.  Melissa Higgins reports that she has been not sleeping well even when taking sleep inducing medications. Sometimes she can sleep but wakes up exhausted and neither refreshed nor restored. She feels that all day she has a decreased level of energy she's excessively daytime sleepy and she sometimes wakes up with a headache. She also suffers from allergies which may affect the ability to breathe through the nose. She has had frequent environmental allergies since living in [place], and in the year 2002 underwent a sinus surgery.  The patient tries to a tear to a certain routine related to her sleep habits. She goes to bed around 10 PM, she can fall asleep fairly promptly since taking trazodone. She will frequently wake up however, she had cystitis and has had frequent bathroom breaks at night. She is currently on fluconazole, Sudafed as needed, loratadine, trazodone. She's also on a birth control pill, as directed. He has been told by friends that she sometimes snores but she has never been observed having apnea. She  seems not to routinely to snore and she is not sure if it is situational or positional dependent. Her sleep is recently full of vivid dreams. "Weird". She may wake up after 8 hours of nocturnal sleep and still feel exhausted. She endorsed the fatigue severity scale at 47 points and the Epworth Sleepiness Scale at 7 points. She feels tired but not to the level that she will have a sleep attack. There is no irresistible urge to fall asleep. The patient has not experienced sleep paralysis, hypnagogic or hypnopompic hallucinations, and she has not experienced  cataplectic attacks.  Before the patient was placed on trazodone she had tried Ambien, and is not sure why it was discontinued. She has not tried [place] or [employer]. I addressed with the patient that she may just have a hangover effect from the trazodone. Him she has never tried a Tylenol PM or any other Benadryl like over-the-counter medication has not taken melatonin, has not used unisom nor any SSRI.  PLAN: Melissa Higgins has very few risk factors for obstructive sleep apnea or central apnea. She has a normal body mass index she is a regular exerciser with good muscle tone, her nasal passages unrestricted she is not forced to breathe through her mouth. Mallampati is a to she still has tonsils and they're mildly irritated but not swollen to a degree with a restrictor airway at all. The snoring that has been witnessed has not been reported to her on a regular basis and may be related to alcohol conservation at dinnertime. She's not a regular drinker of alcohol. She is not a smoker either.Her bed rooom is cool quiet and dark, no TV in the bedroom. It is equipped with a humidifier. She r takes Vit D.  I think that a sleep study at this time has a low yield, unlikely  to explain what is going on with Melissa Higgins and I would much rather try some sleep aids that are not addictive and see if they work to a degree where she feels actually that her sleep is refreshing and  restoring for her. Ambien caused her to sleep talk and did not feel as if she slept at all. I will try Lunesta first.  If this does not work well, try Belsomra.   04-09-14, interval history  The patient reports that she felt an improvement in her daytime fatigue after discontinuation of the trazodone. She still believes that she is a more restless sleeper but she is definitely not quite as daytime sleepy and fatigued. Today's Epworth sleepiness score was endorsed at 2 points and her fatigue severity score at 13 points. Due to the current allergy  season she is on allergy shots and she is taking loratadine twice a week. She has tried [place] but was only given 15 pills per month I will increase his prescription to 30 tablets and instructed the patient to take it 5 nights a week at the max. She has some still some nocturia variable between one bathroom break and 3 bathroom breaks at night.     Review of Systems: Out of a complete 14 system review, the patient complains of only the following symptoms, and all other reviewed systems are negative. The main concern is not sleeping through the night unless on medication, a high degree of fatigue and some snoring which may not be the cause for fatigue. There was no excessive daytime sleepiness endorsed.  Epworth score  2 drom 7 , Fatigue severity score  15 from 47  , depression score 0   History   Social History  . Marital Status: Single    Spouse Name: N/A  . Number of Children: 0  . Years of Education: college   Occupational History  . Not on file.   Social History Main Topics  . Smoking status: Never Smoker   . Smokeless tobacco: Never Used  . Alcohol Use: 0.0 oz/week    0 Standard drinks or equivalent per week     Comment: occasional  . Drug Use: No  . Sexual Activity: Not on file   Other Topics Concern  . Not on file   Social History Narrative   Patient lives at home alone. 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    Family History  Problem Relation Age of Onset  . Hyperlipidemia Mother   . Asthma Father   . Hyperlipidemia Father   . Allergies Father     Past Medical History  Diagnosis Date  . Hypercholesterolemia   . Environmental allergies     allergy shots  . Dysmenorrhea   . C. difficile colitis     augment use  . History of frequent urinary tract infections     Past Surgical History  Procedure Laterality Date  . Nasal sinus surgery  2003    Current Outpatient Prescriptions  Medication Sig Dispense  Refill  . AZURETTE 0.15-0.02/0.01 MG (21/5) tablet TAKE 1 TABLET BY MOUTH EVERY DAY. 84 tablet 3  . eszopiclone (LUNESTA) 2 MG TABS tablet Take 1 tablet (2 mg total) by mouth at bedtime as needed for sleep. Take immediately before bedtime 30 tablet 1  . Loratadine 10 MG CAPS Take 10 mg by mouth daily as needed. 2 times week along with allergy injections.    . phenylephrine (SUDAFED PE) 10 MG TABS tablet Take 10 mg by mouth daily.     No current facility-administered medications for this visit.    Allergies as of 04/09/2014 - Review Complete 04/09/2014  Allergen Reaction Noted  . Augmentin [amoxicillin-pot  clavulanate] Diarrhea 01/31/2012  . Macrobid [nitrofurantoin macrocrystal]  01/31/2012  . Nitrofurantoin Other (See Comments) 03/11/2014    Vitals: BP 111/63 mmHg  Pulse 70  Resp 12  Ht 5' 10.5" (1.791 m)  Wt 174 lb 12.8 oz (79.289 kg)  BMI 24.72 kg/m2  LMP 03/11/2014 Last Weight:  Wt Readings from Last 1 Encounters:  04/09/14 174 lb 12.8 oz (79.289 kg)       Last Height:   Ht Readings from Last 1 Encounters:  04/09/14 5' 10.5" (1.791 m)    Physical exam:  General: The patient is awake, alert and appears not in acute distress. The patient is well groomed. Head: Normocephalic, atraumatic. Neck is supple. Mallampati 3   neck circumference:14.5 . Nasal airflow unrestrcited  TMJclick on the right only . Retrognathia is not een.  Cardiovascular:  Regular rate and rhythm , without  murmurs or carotid bruit, and without distended neck veins. Respiratory: Lungs are clear to auscultation. Skin:  Without evidence of edema, or rash Trunk: normal posture.  Neurologic exam : The patient is awake and alert, oriented to place and time.   Memory subjective described as intact. There is a normal attention span & concentration ability.  Speech is fluent without dysarthria. Mood and affect are appropriate.  Cranial nerves: Pupils are equal and briskly reactive to light. Extraocular  movements  in vertical and horizontal planes intact  Visual fields by finger perimetry are intact. Hearing to finger rub intact.  Facial sensation intact to fine touch. Facial motor strength is symmetric and tongue and uvula move midline.  Motor exam:  Normal tone, muscle bulk . Sensory:  Fine touch, pinprick and vibration were tested in all extremities.  Proprioception is  normal.  Coordination: Rapid alternating movements in the fingers/hands is normal.  Finger-to-nose maneuver  normal without evidence of ataxia, dysmetria or tremor.  Deep tendon reflexes: in the  upper and lower extremities are symmetric and intact. Babinski maneuver response is downgoing.   Assessment:  After physical and neurologic examination, review of laboratory studies, imaging, neurophysiology testing and pre-existing records, assessment is  10 chronic insomnia and daytime sleepiness/ fatigue induced by trazodone.     The patient was advised of the nature of the diagnosed sleep disorder , the treatment options and risks for general a health and wellness arising from not treating the condition. Visit duration was 46minutes.   Plan:  Treatment plan and additional workup :  Lunesta 2 mg tabs 30 , prn at night,  RV yearly .    Asencion Partridge Maxine Fredman MD  04/09/2014

## 2014-04-10 ENCOUNTER — Encounter: Payer: Self-pay | Admitting: Internal Medicine

## 2014-06-25 ENCOUNTER — Telehealth: Payer: Self-pay | Admitting: Obstetrics and Gynecology

## 2014-06-25 NOTE — Telephone Encounter (Signed)
Pt called and left a message on office VM she would like a call back she stated she is having some issues.

## 2014-06-27 NOTE — Telephone Encounter (Signed)
Lm for pt to call back

## 2014-07-01 NOTE — Telephone Encounter (Signed)
LM for pt to call back.

## 2014-07-22 ENCOUNTER — Encounter: Payer: Self-pay | Admitting: Internal Medicine

## 2014-08-04 ENCOUNTER — Other Ambulatory Visit: Payer: Self-pay | Admitting: Internal Medicine

## 2014-08-04 ENCOUNTER — Ambulatory Visit (INDEPENDENT_AMBULATORY_CARE_PROVIDER_SITE_OTHER): Payer: BC Managed Care – PPO | Admitting: Internal Medicine

## 2014-08-04 ENCOUNTER — Encounter: Payer: Self-pay | Admitting: Internal Medicine

## 2014-08-04 VITALS — BP 119/81 | HR 86 | Temp 98.2°F | Ht 70.5 in | Wt 187.4 lb

## 2014-08-04 DIAGNOSIS — N924 Excessive bleeding in the premenopausal period: Secondary | ICD-10-CM

## 2014-08-04 DIAGNOSIS — Z0000 Encounter for general adult medical examination without abnormal findings: Secondary | ICD-10-CM

## 2014-08-04 DIAGNOSIS — F439 Reaction to severe stress, unspecified: Secondary | ICD-10-CM

## 2014-08-04 DIAGNOSIS — K219 Gastro-esophageal reflux disease without esophagitis: Secondary | ICD-10-CM

## 2014-08-04 DIAGNOSIS — G4700 Insomnia, unspecified: Secondary | ICD-10-CM

## 2014-08-04 DIAGNOSIS — Z658 Other specified problems related to psychosocial circumstances: Secondary | ICD-10-CM

## 2014-08-04 DIAGNOSIS — R87619 Unspecified abnormal cytological findings in specimens from cervix uteri: Secondary | ICD-10-CM

## 2014-08-04 MED ORDER — CITALOPRAM HYDROBROMIDE 10 MG PO TABS: 10.0000 mg | ORAL_TABLET | Freq: Every day | ORAL | Status: DC

## 2014-08-04 NOTE — Progress Notes (Signed)
Pre visit review using our clinic review tool, if applicable. No additional management support is needed unless otherwise documented below in the visit note. 

## 2014-08-04 NOTE — Progress Notes (Signed)
Patient ID: Melissa Higgins, female   DOB: 1975/08/19, 39 y.o.   MRN: [id number]   Subjective:    Patient ID: Melissa Higgins, female    DOB: 12/27/1975, 39 y.o.   MRN: [id number]  HPI  Patient here as a work in with concerns regarding decreased energy.  Increased stress with her work.  Also stress with her friends.  Feels down at times - related to this.  Not exercising.  When she is active, no cardiac symptoms with increased activity or exertion.  No sob.  No acid reflux.  Bowels stable.  No suicidal ideations.     Past Medical History  Diagnosis Date  . Hypercholesterolemia   . Environmental allergies     allergy shots  . Dysmenorrhea   . C. difficile colitis     augment use  . History of frequent urinary tract infections     Outpatient Encounter Prescriptions as of 08/04/2014  Medication Sig  . AZURETTE 0.15-0.02/0.01 MG (21/5) tablet TAKE 1 TABLET BY MOUTH EVERY DAY.  Marland Kitchen gentamicin ointment (GARAMYCIN) 0.1 % Apply 0.2 % topically as needed.  . Loratadine 10 MG CAPS Take 10 mg by mouth as needed.  . mupirocin ointment (BACTROBAN) 2 % Apply 22 application topically as needed.  . phenylephrine (SUDAFED PE) 10 MG TABS tablet Take 10 mg by mouth daily.  . SUDAFED 12 HOUR 120 MG 12 hr tablet Take 1 tablet by mouth as needed.  . [DISCONTINUED] citalopram (CELEXA) 10 MG tablet Take 1 tablet (10 mg total) by mouth daily.  . [DISCONTINUED] eszopiclone (LUNESTA) 2 MG TABS tablet Take 1 tablet (2 mg total) by mouth at bedtime. Take immediately before bedtime  . [DISCONTINUED] Loratadine 10 MG CAPS Take 10 mg by mouth daily as needed. 2 times week along with allergy injections.   No facility-administered encounter medications on file as of 08/04/2014.    Review of Systems  Constitutional: Positive for fatigue. Negative for appetite change and unexpected weight change.  HENT: Negative for congestion and sinus pressure.   Respiratory: Negative for cough, chest tightness and shortness of  breath.   Cardiovascular: Negative for chest pain, palpitations and leg swelling.  Gastrointestinal: Negative for nausea, vomiting, abdominal pain and diarrhea.  Genitourinary:       Has noticed some increased bleeding - between her periods.   Is taking her ocp's.  Vaginal odor.  No vaginal discharge or itching.    Skin: Negative for color change and rash.  Neurological: Negative for dizziness, light-headedness and headaches.  Psychiatric/Behavioral: Negative for dysphoric mood and agitation.       Increased stress as outlined.  No suicidal ideations.        Objective:   blood pressure recheck:  118/78  Physical Exam  Constitutional: She appears well-developed and well-nourished. No distress.  HENT:  Nose: Nose normal.  Mouth/Throat: Oropharynx is clear and moist.  Neck: Neck supple. No thyromegaly present.  Cardiovascular: Normal rate and regular rhythm.   Pulmonary/Chest: Breath sounds normal. No respiratory distress. She has no wheezes.  Abdominal: Soft. Bowel sounds are normal. There is no tenderness.  Musculoskeletal: She exhibits no edema or tenderness.  Lymphadenopathy:    She has no cervical adenopathy.  Skin: No rash noted. No erythema.  Psychiatric: She has a normal mood and affect. Her behavior is normal.    BP 119/81 mmHg  Pulse 86  Temp(Src) 98.2 F (36.8 C) (Oral)  Ht 5' 10.5" (1.791 m)  Wt 187 lb 6 oz (84.993  kg)  BMI 26.50 kg/m2  SpO2 98%  LMP 07/29/2014 Wt Readings from Last 3 Encounters:  08/04/14 187 lb 6 oz (84.993 kg)  04/09/14 174 lb 12.8 oz (79.289 kg)  03/20/14 183 lb 6 oz (83.178 kg)     Lab Results  Component Value Date   WBC 6.6 09/26/2013   HGB 14.3 09/26/2013   HCT 42.6 09/26/2013   PLT 394.0 09/26/2013   GLUCOSE 90 04/09/2014   CHOL 185 04/09/2014   TRIG 93.0 04/09/2014   HDL 48.00 04/09/2014   LDLCALC 118* 04/09/2014   ALT 24 04/09/2014   AST 21 04/09/2014   NA 138 04/09/2014   K 3.8 04/09/2014   CL 106 04/09/2014    CREATININE 0.81 04/09/2014   BUN 17 04/09/2014   CO2 27 04/09/2014   TSH 1.07 09/26/2013       Assessment & Plan:   Problem List Items Addressed This Visit    Abnormal Pap smear of cervix    Followed by gyn.       GERD (gastroesophageal reflux disease) - Primary    No acid reflux.  Follow.       Health care maintenance    Physical 09/10/13.  Mammogram 10/31/13 - birads I.  PAP 03/20/14 - ASCUS with positive HPV.  Referred to gyn.  Seeing gyn.       Heavy period    Passing clots and is having bleeding while taking OCP's.  Refer back to gyn for evaluation.        Relevant Orders   Ambulatory referral to Gynecology   Insomnia    Sleeping better now.  Follow.  On no medication.        Stress    Discussed at length with her today.  Some reactive depression.  No suicidal ideations.  Start citalopram as directed.  Contact [employer] .         I spent 25 minutes with the patient and more than 50% of the time was spent in consultation regarding the above.     Einar Pheasant, MD

## 2014-08-05 DIAGNOSIS — N920 Excessive and frequent menstruation with regular cycle: Secondary | ICD-10-CM | POA: Insufficient documentation

## 2014-08-05 DIAGNOSIS — F439 Reaction to severe stress, unspecified: Secondary | ICD-10-CM | POA: Insufficient documentation

## 2014-08-05 NOTE — Assessment & Plan Note (Signed)
Sleeping better now.  Follow.  On no medication.

## 2014-08-05 NOTE — Assessment & Plan Note (Signed)
Discussed at length with her today.  Some reactive depression.  No suicidal ideations.  Start citalopram as directed.  Contact [employer] .

## 2014-08-05 NOTE — Assessment & Plan Note (Signed)
Physical 09/10/13.  Mammogram 10/31/13 - birads I.  PAP 03/20/14 - ASCUS with positive HPV.  Referred to gyn.  Seeing gyn.

## 2014-08-05 NOTE — Assessment & Plan Note (Signed)
No acid reflux.  Follow.

## 2014-08-05 NOTE — Assessment & Plan Note (Signed)
Followed by gyn

## 2014-08-05 NOTE — Assessment & Plan Note (Signed)
Passing clots and is having bleeding while taking OCP's.  Refer back to gyn for evaluation.

## 2014-08-07 ENCOUNTER — Encounter: Payer: Self-pay | Admitting: Obstetrics and Gynecology

## 2014-08-07 ENCOUNTER — Ambulatory Visit (INDEPENDENT_AMBULATORY_CARE_PROVIDER_SITE_OTHER): Payer: BC Managed Care – PPO | Admitting: Obstetrics and Gynecology

## 2014-08-07 VITALS — BP 127/77 | HR 88 | Ht 70.5 in | Wt 183.7 lb

## 2014-08-07 DIAGNOSIS — N9489 Other specified conditions associated with female genital organs and menstrual cycle: Secondary | ICD-10-CM

## 2014-08-07 DIAGNOSIS — N898 Other specified noninflammatory disorders of vagina: Secondary | ICD-10-CM

## 2014-08-07 DIAGNOSIS — N939 Abnormal uterine and vaginal bleeding, unspecified: Secondary | ICD-10-CM | POA: Diagnosis not present

## 2014-08-07 DIAGNOSIS — N923 Ovulation bleeding: Secondary | ICD-10-CM

## 2014-08-11 LAB — NUSWAB VAGINITIS PLUS (VG+)
Atopobium vaginae: HIGH Score — AB
BVAB 2: HIGH Score — AB
CANDIDA GLABRATA, NAA: NEGATIVE
Megasphaera 1: HIGH Score — AB

## 2014-08-11 NOTE — Progress Notes (Signed)
GYNECOLOGY PROGRESS NOTE  Subjective:    Patient ID: Melissa Higgins, female    DOB: 1975/12/14, 39 y.o.   MRN: [id number]  HPI  Patient is a 39 y.o. female who presents for complaints of vaginal odor.  Reports that she notices it occasionally, as well as after intercourse and just prior to menses.  Notes that partner cannot detect odor. Denies vaginal discharge or itching/burning. Patient concerned as she has a h/o recurrent vaginitis.  Patient's last menstrual period was 07/29/2014.    The following portions of the patient's history were reviewed and updated as appropriate: allergies, current medications, past family history, past medical history, past social history, past surgical history and problem list.  Review of Systems A comprehensive review of systems was negative except for: Genitourinary: positive for vaginal odor, and irregular bleeding.  Notes light bleeding/spotting daily   Objective:   Blood pressure 127/77, pulse 88, height 5' 10.5" (1.791 m), weight 183 lb 11.2 oz (83.326 kg), last menstrual period 07/29/2014. General appearance: alert and no distress Abdomen: soft, non-tender; bowel sounds normal; no masses,  no organomegaly Pelvic: cervix normal in appearance, external genitalia normal, no adnexal masses or tenderness, no cervical motion tenderness, rectovaginal septum normal, uterus normal size, shape, and consistency and vagina normal with scant white thin vaginal discharge, no odor.  Extremities: extremities normal, atraumatic, no cyanosis or edema Neurologic: Grossly normal   Labs:  Microscopic wet-mount exam shows few clue cells, no trichomonads, yeast, or trichomonads.   Assessment:   Vaginal odor H/o recurrent vaginitis Intermenstrual bleeding/spotting  Plan:   Screened for vaginitis, no evidence of infection, however will also send Nuswab.  Offered reassurance.  Continue to encourage previously recommended hygiene practices.  No evidence of cervical  polyps, scant discharge noted in vaginal vault.  Unsure cause as to irregular bleeding.  Will order pelvic sono to assess endometrial cavity.  Discussed that if all is normal, may need to change OCP (as patient has been on same pill for several years).    Rubie Maid, MD [facility]

## 2014-08-13 ENCOUNTER — Telehealth: Payer: Self-pay

## 2014-08-13 ENCOUNTER — Telehealth: Payer: Self-pay | Admitting: Obstetrics and Gynecology

## 2014-08-13 MED ORDER — CLINDAMYCIN HCL 300 MG PO CAPS: 300.0000 mg | ORAL_CAPSULE | Freq: Two times a day (BID) | ORAL | Status: DC

## 2014-08-13 NOTE — Telephone Encounter (Signed)
Ok.  Please let her know that I called in Clindamycin to her pharmacy since she has taken Flagyl several times.

## 2014-08-13 NOTE — Telephone Encounter (Signed)
-----   Message from Rubie Maid, MD sent at 08/12/2014  2:12 PM EDT ----- Please inform patient of positive for BV infection.  Will need treatment with either Flagyl (PO or vaginal) or can try clindamycin (PO or vaginal) as she has had h/o recurrent infections in the past.

## 2014-08-13 NOTE — Telephone Encounter (Signed)
This pt said [facility] called her with 2 options. She was returning okie call. okie left a msg with her and she wants to know her options.

## 2014-08-13 NOTE — Telephone Encounter (Signed)
PT HAS A BACTERIAL INFECTION AND SHE NEEDS A RX CALLED IN, SHE TALKED TO [facility] AND THEY WERE TALKING ABOUT TWO RX'S, BUT SHE STATES THAT SHE DOES NOT CARE WHICH ONE IS CALLED IN SHE WOULD LIKE SOMETHING CALLED IN SO SHE CAN START TREATMENT.

## 2014-08-13 NOTE — Telephone Encounter (Signed)
LM for pt informing her of +BV, advised to call back and inform us of how she would like to be treated as Dr.Cherry gave 2 treatment options below.

## 2014-08-14 NOTE — Telephone Encounter (Signed)
PT AWARE  

## 2014-08-15 ENCOUNTER — Encounter: Payer: Self-pay | Admitting: Obstetrics and Gynecology

## 2014-08-15 ENCOUNTER — Ambulatory Visit: Payer: BC Managed Care – PPO

## 2014-08-15 ENCOUNTER — Ambulatory Visit (INDEPENDENT_AMBULATORY_CARE_PROVIDER_SITE_OTHER): Payer: BC Managed Care – PPO | Admitting: Obstetrics and Gynecology

## 2014-08-15 VITALS — BP 135/74 | HR 71 | Ht 70.5 in | Wt 185.0 lb

## 2014-08-15 DIAGNOSIS — N939 Abnormal uterine and vaginal bleeding, unspecified: Secondary | ICD-10-CM

## 2014-08-15 DIAGNOSIS — Z1389 Encounter for screening for other disorder: Secondary | ICD-10-CM | POA: Diagnosis not present

## 2014-08-15 DIAGNOSIS — N923 Ovulation bleeding: Secondary | ICD-10-CM

## 2014-08-15 DIAGNOSIS — Z331 Pregnant state, incidental: Secondary | ICD-10-CM

## 2014-08-15 LAB — POCT URINALYSIS DIPSTICK
BILIRUBIN UA: NEGATIVE
Glucose, UA: NEGATIVE
Ketones, UA: NEGATIVE
Leukocytes, UA: NEGATIVE
Nitrite, UA: NEGATIVE
PH UA: 5
Protein, UA: NEGATIVE
SPEC GRAV UA: 1.025
UROBILINOGEN UA: NEGATIVE

## 2014-08-15 NOTE — Telephone Encounter (Signed)
Pt in office for appt today.

## 2014-08-17 MED ORDER — NORETHIN ACE-ETH ESTRAD-FE 1-20 MG-MCG(24) PO CHEW: 1.0000 | CHEWABLE_TABLET | Freq: Every day | ORAL | Status: DC

## 2014-08-17 NOTE — Progress Notes (Signed)
GYNECOLOGY PROGRESS NOTE  Subjective:    Patient ID: Melissa Higgins, female    DOB: May 08, 1975, 39 y.o.   MRN: [id number]  HPI  Patient is a 39 y.o. P0 female who presents for f/u after ultrasound for intermenstrual spotting. Patient's last menstrual period was 07/29/2014.   The following portions of the patient's history were reviewed and updated as appropriate: allergies, current medications, past family history, past medical history, past social history, past surgical history and problem list.  Review of Systems A comprehensive review of systems was negative.   Objective:   Blood pressure 135/74, pulse 71, height 5' 10.5" (1.791 m), weight 185 lb (83.915 kg), last menstrual period 07/29/2014. General appearance: alert and no distress  Exam deferred   Imaging:  Pelvic Ultrasound 08/15/2014:  Findings:  The uterus measures 6.8 x 4.1 x 4.0 cm. Retroverted position. Echo texture is homogenous with evidence of a focal fibroid. Within the uterus there is one focal fibroid. This is in the posterior, mid portion of the uterus. This appears to be submucosal. Fibroid 1: 3.0 x 1.9 x 2.2 cm.  The Endometrium measures 2.9 mm.  Right Ovary measures 1.5 x 0.8 x 1.5 cm. It is normal in appearance. Left Ovary measures 1.2 x 1.6 x 1.3 cm. It is normal appearance. Survey of the adnexa demonstrates no adnexal masses. There is a trace amount free fluid in the cul de sac.  Assessment:   Intermenstrual spotting Uterine fibroid  Plan:   Patient with intermenstrual spotting over the past 1-2 months.  Recently diagnosed with BV at last visit.  Could potentially be a cause. Is completing treatment with Clindamycin.  Also previously discussed changing to a different OCP as she has been on same one for ~ 2 years.  Given sample of Minastrin. To call back in 1 month to refill if working well. Fibroid could also be potential cause of abnormal bleeding/spottig.  If abnormal spotting continues, patient may  need endometrial biopsy to r/o malignancy (although today's sono with normal EM stripe).  F/u if symptoms fail to improve or worsen.   A total of 15 minutes were spent face-to-face with the patient during this encounter and over half of that time dealt with counseling and coordination of care.   Rubie Maid, MD [facility]

## 2014-09-01 ENCOUNTER — Other Ambulatory Visit: Payer: Self-pay | Admitting: Internal Medicine

## 2014-09-01 ENCOUNTER — Encounter: Payer: Self-pay | Admitting: Internal Medicine

## 2014-09-01 MED ORDER — PHENAZOPYRIDINE HCL 200 MG PO TABS: 200.0000 mg | ORAL_TABLET | Freq: Three times a day (TID) | ORAL | Status: DC | PRN

## 2014-09-01 NOTE — Telephone Encounter (Signed)
rx sent in for pyridum #9 with no refills.

## 2014-09-01 NOTE — Progress Notes (Signed)
Opened in error

## 2014-09-21 ENCOUNTER — Encounter: Payer: Self-pay | Admitting: Obstetrics and Gynecology

## 2014-09-22 ENCOUNTER — Other Ambulatory Visit: Payer: Self-pay

## 2014-09-22 ENCOUNTER — Encounter: Payer: Self-pay | Admitting: Internal Medicine

## 2014-09-22 ENCOUNTER — Ambulatory Visit (INDEPENDENT_AMBULATORY_CARE_PROVIDER_SITE_OTHER): Payer: BC Managed Care – PPO | Admitting: Internal Medicine

## 2014-09-22 ENCOUNTER — Other Ambulatory Visit: Payer: Self-pay | Admitting: Internal Medicine

## 2014-09-22 VITALS — BP 110/80 | HR 77 | Temp 98.6°F | Ht 70.5 in | Wt 192.0 lb

## 2014-09-22 DIAGNOSIS — Z658 Other specified problems related to psychosocial circumstances: Secondary | ICD-10-CM

## 2014-09-22 DIAGNOSIS — N760 Acute vaginitis: Secondary | ICD-10-CM

## 2014-09-22 DIAGNOSIS — K219 Gastro-esophageal reflux disease without esophagitis: Secondary | ICD-10-CM | POA: Diagnosis not present

## 2014-09-22 DIAGNOSIS — R87619 Unspecified abnormal cytological findings in specimens from cervix uteri: Secondary | ICD-10-CM

## 2014-09-22 DIAGNOSIS — N3000 Acute cystitis without hematuria: Secondary | ICD-10-CM | POA: Diagnosis not present

## 2014-09-22 DIAGNOSIS — F439 Reaction to severe stress, unspecified: Secondary | ICD-10-CM

## 2014-09-22 DIAGNOSIS — Z91048 Other nonmedicinal substance allergy status: Secondary | ICD-10-CM

## 2014-09-22 DIAGNOSIS — N924 Excessive bleeding in the premenopausal period: Secondary | ICD-10-CM

## 2014-09-22 DIAGNOSIS — G479 Sleep disorder, unspecified: Secondary | ICD-10-CM

## 2014-09-22 DIAGNOSIS — Z9109 Other allergy status, other than to drugs and biological substances: Secondary | ICD-10-CM

## 2014-09-22 MED ORDER — NORETHIN ACE-ETH ESTRAD-FE 1-20 MG-MCG(24) PO CHEW: 1.0000 | CHEWABLE_TABLET | Freq: Every day | ORAL | Status: DC

## 2014-09-22 NOTE — Progress Notes (Signed)
Pre-visit discussion using our clinic review tool. No additional management support is needed unless otherwise documented below in the visit note.  

## 2014-09-22 NOTE — Progress Notes (Signed)
Patient ID: Melissa Higgins, female   DOB: 06/15/1975, 39 y.o.   MRN: [id number]   Subjective:    Patient ID: Melissa Higgins, female    DOB: [birth year], 39 y.o.   MRN: [id number]  HPI  Patient here for a scheduled follow up.  She is here to f/u regarding increased stress.  She also recently has been diagnosed with bronchitis and a urinary tract infection.  Was placed on abx.  Felt better initially.  Went back to acute care.  Had cxr 09/09/14.  States cxr ok.   Cough is better.  Feels better.  Some nasal dripping, but overall better.  No sob.  No chest congestion.  No acid reflux.  No abdominal pain or cramping.  Bowels stable.  Wants urine rechecked.  Some vaginal irritation.  Wants to be checked to confirm no vaginal infection.  No abdominal pain or cramping.  Handling stress better.  School is going better.     Past Medical History  Diagnosis Date  . Hypercholesterolemia   . Environmental allergies     allergy shots  . Dysmenorrhea   . C. difficile colitis     augment use  . History of frequent urinary tract infections   . Depression    Past Surgical History  Procedure Laterality Date  . Nasal sinus surgery  2003   Family History  Problem Relation Age of Onset  . Hyperlipidemia Mother   . Asthma Father   . Hyperlipidemia Father   . Allergies Father    Social History   Social History  . Marital Status: Single    Spouse Name: N/A  . Number of Children: 0  . Years of Education: college   Social History Main Topics  . Smoking status: Never Smoker   . Smokeless tobacco: Never Used  . Alcohol Use: 0.0 oz/week    0 Standard drinks or equivalent per week     Comment: occasional  . Drug Use: No  . Sexual Activity: Not Asked   Other Topics Concern  . 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    Outpatient Encounter Prescriptions as of 09/22/2014  Medication Sig  .  benzonatate (TESSALON) 200 MG capsule Take by mouth.  . citalopram (CELEXA) 10 MG tablet TAKE 1 TABLET(10 MG) BY MOUTH DAILY  . HYDROcodone-homatropine (HYCODAN) 5-1.5 MG/5ML syrup Take by mouth.  . Loratadine 10 MG CAPS Take 10 mg by mouth as needed.  . mupirocin ointment (BACTROBAN) 2 % Apply 22 application topically as needed.  . Norethin Ace-Eth Estrad-FE (MINASTRIN 24 FE) 1-20 MG-MCG(24) CHEW Chew 1 tablet by mouth daily.  . SUDAFED 12 HOUR 120 MG 12 hr tablet Take 1 tablet by mouth as needed.  . [DISCONTINUED] clindamycin (CLEOCIN) 300 MG capsule Take 1 capsule (300 mg total) by mouth 2 (two) times daily. For seven days  . [DISCONTINUED] phenazopyridine (PYRIDIUM) 200 MG tablet Take 1 tablet (200 mg total) by mouth 3 (three) times daily as needed for pain.  . [DISCONTINUED] Norethin Ace-Eth Estrad-FE (MINASTRIN 24 FE) 1-20 MG-MCG(24) CHEW Chew 1 tablet by mouth daily.   No facility-administered encounter medications on file as of 09/22/2014.    Review of Systems  Constitutional: Negative for appetite change and unexpected weight change.  HENT: Positive for postnasal drip. Negative for sinus pressure.        Previous congestion has improved.  Eyes: Negative for pain and visual disturbance.  Respiratory: Negative for cough, chest tightness and shortness of breath.   Cardiovascular: Negative for chest pain, palpitations and leg swelling.  Gastrointestinal: Negative for nausea, vomiting, abdominal pain and diarrhea.  Genitourinary: Negative for dysuria and difficulty urinating.  Musculoskeletal: Negative for back pain and joint swelling.  Skin: Negative for color change and rash.  Neurological: Negative for dizziness, light-headedness and headaches.  Psychiatric/Behavioral: Negative for dysphoric mood and agitation.       Objective:    Physical Exam  Constitutional: She appears well-developed and well-nourished. No distress.  HENT:  Nose: Nose normal.  Mouth/Throat: Oropharynx  is clear and moist.  No sinus tenderness to palpation.   Eyes: Conjunctivae are normal. Right eye exhibits no discharge. Left eye exhibits no discharge.  Neck: Neck supple. No thyromegaly present.  Cardiovascular: Normal rate and regular rhythm.   Pulmonary/Chest: Breath sounds normal. No respiratory distress. She has no wheezes.  Abdominal: Soft. Bowel sounds are normal. There is no tenderness.  Genitourinary:  Normal external genitalia.  Vaginal vault without lesions.  Could not appreciate any adnexal masses or tenderness.  KOH/wet prep sent.    Musculoskeletal: She exhibits no edema or tenderness.  Lymphadenopathy:    She has no cervical adenopathy.  Skin: No rash noted. No erythema.  Psychiatric: She has a normal mood and affect. Her behavior is normal.    BP 110/80 mmHg  Pulse 77  Temp(Src) 98.6 F (37 C) (Oral)  Ht 5' 10.5" (1.791 m)  Wt 192 lb (87.091 kg)  BMI 27.15 kg/m2  SpO2 98%  LMP 08/26/2014 (Exact Date) Wt Readings from Last 3 Encounters:  09/22/14 192 lb (87.091 kg)  08/15/14 185 lb (83.915 kg)  08/07/14 183 lb 11.2 oz (83.326 kg)     Lab Results  Component Value Date   WBC 6.6 09/26/2013   HGB 14.3 09/26/2013   HCT 42.6 09/26/2013   PLT 394.0 09/26/2013   GLUCOSE 90 04/09/2014   CHOL 185 04/09/2014   TRIG 93.0 04/09/2014   HDL 48.00 04/09/2014   LDLCALC 118* 04/09/2014   ALT 24 04/09/2014   AST 21 04/09/2014   NA 138 04/09/2014   K 3.8 04/09/2014   CL 106 04/09/2014   CREATININE 0.81 04/09/2014   BUN 17 04/09/2014   CO2 27 04/09/2014   TSH 1.07 09/26/2013       Assessment & Plan:   Problem List Items Addressed This Visit    Abnormal Pap smear of cervix    Followed by gyn.       Difficulty sleeping    Sleeping better.        Environmental allergies    Has seen Dr Tami Ribas.  Receiving allergy injections.  Recently treated for infection.  Saline nasal spray and nasacort nasal spray as outlined.  Follow.        GERD (gastroesophageal  reflux disease)    No acid reflux reported.  Follow.        Stress    Doing better.  Discussed with her today.  Does not feel she needs any further intervention at this time.  Follow.  Has seen [facility] .       UTI (urinary tract infection) - Primary    Treated.  Symptoms improved.  Recheck urine today to confirm no infection.       Relevant Orders   Urinalysis, Routine w reflex microscopic (not at [facility]) (Completed)   CULTURE, URINE COMPREHENSIVE   Vaginitis  KOH/wet prep sent.  Await results.       Relevant Orders   WET PREP FOR TRICH, YEAST, CLUE       Einar Pheasant, MD

## 2014-09-23 ENCOUNTER — Encounter: Payer: Self-pay | Admitting: Internal Medicine

## 2014-09-23 LAB — URINALYSIS, ROUTINE W REFLEX MICROSCOPIC
BILIRUBIN URINE: NEGATIVE
Hgb urine dipstick: NEGATIVE
Ketones, ur: NEGATIVE
LEUKOCYTES UA: NEGATIVE
NITRITE: NEGATIVE
RBC / HPF: NONE SEEN (ref 0–?)
Specific Gravity, Urine: >=1.03 — AB (ref 1.000–1.030)
Total Protein, Urine: NEGATIVE
Urine Glucose: NEGATIVE
Urobilinogen, UA: 0.2 (ref 0.0–1.0)
WBC, UA: NONE SEEN (ref 0–?)
pH: 5.5 (ref 5.0–8.0)

## 2014-09-24 ENCOUNTER — Telehealth: Payer: Self-pay | Admitting: *Deleted

## 2014-09-24 LAB — WET PREP BY MOLECULAR PROBE
Candida species: NEGATIVE
GARDNERELLA VAGINALIS: NEGATIVE
Trichomonas vaginosis: NEGATIVE

## 2014-09-24 NOTE — Telephone Encounter (Signed)
[facility] called that there wasn't enough urine for a culture

## 2014-09-28 ENCOUNTER — Encounter: Payer: Self-pay | Admitting: Internal Medicine

## 2014-09-28 NOTE — Assessment & Plan Note (Signed)
Treated.  Symptoms improved.  Recheck urine today to confirm no infection.

## 2014-09-28 NOTE — Assessment & Plan Note (Signed)
Sleeping better 

## 2014-09-28 NOTE — Assessment & Plan Note (Addendum)
Doing better.  Discussed with her today.  Does not feel she needs any further intervention at this time.  Follow.  Has seen [employer] .

## 2014-09-28 NOTE — Assessment & Plan Note (Signed)
KOH/wet prep sent.  Await results.

## 2014-09-28 NOTE — Assessment & Plan Note (Signed)
Followed by gyn

## 2014-09-28 NOTE — Assessment & Plan Note (Signed)
No acid reflux reported.  Follow.    

## 2014-09-28 NOTE — Assessment & Plan Note (Signed)
Has seen Dr Tami Ribas.  Receiving allergy injections.  Recently treated for infection.  Saline nasal spray and nasacort nasal spray as outlined.  Follow.

## 2014-10-06 ENCOUNTER — Encounter: Payer: Self-pay | Admitting: Internal Medicine

## 2014-10-06 DIAGNOSIS — N949 Unspecified condition associated with female genital organs and menstrual cycle: Secondary | ICD-10-CM

## 2014-10-07 NOTE — Telephone Encounter (Signed)
Order placed for gyn referral.  

## 2014-10-08 ENCOUNTER — Ambulatory Visit: Payer: BC Managed Care – PPO | Admitting: Obstetrics and Gynecology

## 2014-10-10 ENCOUNTER — Other Ambulatory Visit: Payer: Self-pay | Admitting: Internal Medicine

## 2014-10-10 ENCOUNTER — Ambulatory Visit (INDEPENDENT_AMBULATORY_CARE_PROVIDER_SITE_OTHER): Payer: BC Managed Care – PPO | Admitting: Internal Medicine

## 2014-10-10 VITALS — BP 106/62 | HR 78 | Temp 98.2°F | Wt 195.0 lb

## 2014-10-10 DIAGNOSIS — N898 Other specified noninflammatory disorders of vagina: Secondary | ICD-10-CM | POA: Diagnosis not present

## 2014-10-10 DIAGNOSIS — N760 Acute vaginitis: Secondary | ICD-10-CM | POA: Diagnosis not present

## 2014-10-10 DIAGNOSIS — R350 Frequency of micturition: Secondary | ICD-10-CM | POA: Diagnosis not present

## 2014-10-10 DIAGNOSIS — N3000 Acute cystitis without hematuria: Secondary | ICD-10-CM | POA: Diagnosis not present

## 2014-10-10 LAB — POCT URINALYSIS DIPSTICK
BILIRUBIN UA: NEGATIVE
GLUCOSE UA: NEGATIVE
Ketones, UA: NEGATIVE
Leukocytes, UA: NEGATIVE
Nitrite, UA: NEGATIVE
Protein, UA: NEGATIVE
Urobilinogen, UA: 0.2
pH, UA: 5.5

## 2014-10-10 MED ORDER — ACYCLOVIR 400 MG PO TABS: 400.0000 mg | ORAL_TABLET | Freq: Three times a day (TID) | ORAL | Status: DC

## 2014-10-10 NOTE — Progress Notes (Signed)
Patient ID: ANN-MARIE KLUGE, female   DOB: 19-Aug-1975, 39 y.o.   MRN: [id number]   Subjective:    Patient ID: [name], female    DOB: 1975-02-06, 39 y.o.   MRN: [id number]  HPI  Patient here as a work in with concerns regarding vaginal irritation and discharge.  States she will intermittently get what feels like a small cut on the vaginal lips.  Will last for a while and then resolve.  This one has been persistent.  Feels like the vaginal lips are swollen.  Some discharge.  Has had intermittent issues.  Sees gyn.  Unable to see them this week.  Worked in today for these persistent issues.  Also reports some irritation with urination.  No abdominal pain or cramping.  No fever.  No nausea or vomiting.     Past Medical History  Diagnosis Date  . Hypercholesterolemia   . Environmental allergies     allergy shots  . Dysmenorrhea   . C. difficile colitis     augment use  . History of frequent urinary tract infections   . Depression    Past Surgical History  Procedure Laterality Date  . Nasal sinus surgery  2003   Family History  Problem Relation Age of Onset  . Hyperlipidemia Mother   . Asthma Father   . Hyperlipidemia Father   . Allergies Father    Social History   Social History  . Marital Status: Single    Spouse Name: N/A  . Number of Children: 0  . Years of Education: college   Social History Main Topics  . Smoking status: Never Smoker   . Smokeless tobacco: Never Used  . Alcohol Use: 0.0 oz/week    0 Standard drinks or equivalent per week     Comment: occasional  . Drug Use: No  . Sexual Activity: Not Asked   Other Topics Concern  . 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    Outpatient Encounter Prescriptions as of 10/10/2014  Medication Sig  . HYDROcodone-homatropine (HYCODAN) 5-1.5 MG/5ML syrup Take by mouth.  . Loratadine 10 MG CAPS Take 10  mg by mouth as needed.  . mupirocin ointment (BACTROBAN) 2 % Apply 22 application topically as needed.  . Norethin Ace-Eth Estrad-FE (MINASTRIN 24 FE) 1-20 MG-MCG(24) CHEW Chew 1 tablet by mouth daily.  . SUDAFED 12 HOUR 120 MG 12 hr tablet Take 1 tablet by mouth as needed.  Marland Kitchen acyclovir (ZOVIRAX) 400 MG tablet Take 1 tablet (400 mg total) by mouth 3 (three) times daily.  . benzonatate (TESSALON) 200 MG capsule Take by mouth.  . citalopram (CELEXA) 10 MG tablet TAKE 1 TABLET(10 MG) BY MOUTH DAILY (Patient not taking: Reported on 10/10/2014)   No facility-administered encounter medications on file as of 10/10/2014.    Review of Systems  Constitutional: Negative for appetite change and unexpected weight change.  Respiratory: Negative for cough, chest tightness and shortness of breath.   Cardiovascular: Negative for chest pain and palpitations.  Gastrointestinal: Negative for nausea, vomiting, abdominal pain and diarrhea.  Genitourinary: Positive for dysuria. Negative for difficulty urinating.       Vaginal irritation and discharge as outlined.  Painful lesion - vaginal lip.    Musculoskeletal: Negative for back pain and joint swelling.  Psychiatric/Behavioral: Negative for dysphoric mood and agitation.       Objective:  Physical Exam  Constitutional: She appears well-developed and well-nourished. No distress.  Neck: Neck supple.  Cardiovascular: Normal rate and regular rhythm.   Pulmonary/Chest: Breath sounds normal. No respiratory distress. She has no wheezes.  Abdominal: Soft. Bowel sounds are normal. There is no tenderness.  Genitourinary:  External vaginal exam revealed lesion - superior left labia.  Possible herpetic lesion.  Vaginal vault without lesions.  Discharge present.  Could not appreciate any adnexal masses or tenderness.    Lymphadenopathy:    She has no cervical adenopathy.    BP 106/62 mmHg  Pulse 78  Temp(Src) 98.2 F (36.8 C) (Oral)  Wt 195 lb (88.451 kg)   SpO2 98%  LMP 09/26/2014 Wt Readings from Last 3 Encounters:  10/10/14 195 lb (88.451 kg)  09/22/14 192 lb (87.091 kg)  08/15/14 185 lb (83.915 kg)     Lab Results  Component Value Date   WBC 6.6 09/26/2013   HGB 14.3 09/26/2013   HCT 42.6 09/26/2013   PLT 394.0 09/26/2013   GLUCOSE 90 04/09/2014   CHOL 185 04/09/2014   TRIG 93.0 04/09/2014   HDL 48.00 04/09/2014   LDLCALC 118* 04/09/2014   ALT 24 04/09/2014   AST 21 04/09/2014   NA 138 04/09/2014   K 3.8 04/09/2014   CL 106 04/09/2014   CREATININE 0.81 04/09/2014   BUN 17 04/09/2014   CO2 27 04/09/2014   TSH 1.07 09/26/2013       Assessment & Plan:   Problem List Items Addressed This Visit    UTI (urinary tract infection)    Previous UTI.  Treated.  With symptoms, check urine culture.  Urine dip negative.  Stay hydrated.  Follow.       Relevant Medications   acyclovir (ZOVIRAX) 400 MG tablet   Vaginal lesion    Persistent vaginal lesion.  Herpetic culture obtained.  Treat with acyclovir.        Relevant Orders   WET PREP FOR [place], YEAST, CLUE   GC/Chlamydia Probe Amp   Herpes simplex virus culture   Vaginitis    Discharge present.  Check KOH/wet prep.  Follow.   GC/chlamydia sent as well.         Other Visit Diagnoses    Urinary frequency    -  Primary    Relevant Orders    POCT Urinalysis Dipstick (Completed)    CULTURE, URINE COMPREHENSIVE (Completed)        Einar Pheasant, MD

## 2014-10-10 NOTE — Telephone Encounter (Signed)
Sent pt a message regarding an appt.

## 2014-10-11 LAB — GC/CHLAMYDIA PROBE AMP
CT PROBE, AMP APTIMA: NEGATIVE
GC PROBE AMP APTIMA: NEGATIVE

## 2014-10-12 LAB — CULTURE, URINE COMPREHENSIVE
Colony Count: NO GROWTH
Organism ID, Bacteria: NO GROWTH

## 2014-10-13 ENCOUNTER — Encounter: Payer: Self-pay | Admitting: Internal Medicine

## 2014-10-13 DIAGNOSIS — N898 Other specified noninflammatory disorders of vagina: Secondary | ICD-10-CM | POA: Insufficient documentation

## 2014-10-13 NOTE — Assessment & Plan Note (Signed)
Previous UTI.  Treated.  With symptoms, check urine culture.  Urine dip negative.  Stay hydrated.  Follow.

## 2014-10-13 NOTE — Assessment & Plan Note (Addendum)
Discharge present.  Check KOH/wet prep.  Follow.   GC/chlamydia sent as well.

## 2014-10-13 NOTE — Assessment & Plan Note (Signed)
Persistent vaginal lesion.  Herpetic culture obtained.  Treat with acyclovir.

## 2014-10-14 ENCOUNTER — Telehealth: Payer: Self-pay

## 2014-10-14 LAB — HERPES SIMPLEX VIRUS CULTURE: Organism ID, Bacteria: DETECTED

## 2014-10-14 NOTE — Telephone Encounter (Signed)
Call received from [facility].  Question if the specimen sent for herpes was what type of specimen.  Confirmed that it was a Vaginal specimen.  Second question, the wet prep went with no swab in the bottle.  The lab will not be able to test for Trich as the swab is needed per the lab tech.  Please advise that the specimen will need to be recollected.  Thanks

## 2014-10-14 NOTE — Telephone Encounter (Signed)
Pt notified of test error.  She is aware.  Will await other culture results.

## 2014-10-16 ENCOUNTER — Ambulatory Visit (INDEPENDENT_AMBULATORY_CARE_PROVIDER_SITE_OTHER): Payer: BC Managed Care – PPO | Admitting: Obstetrics and Gynecology

## 2014-10-16 ENCOUNTER — Encounter: Payer: Self-pay | Admitting: Obstetrics and Gynecology

## 2014-10-16 VITALS — BP 114/76 | HR 73 | Temp 98.4°F | Resp 14 | Ht 70.5 in | Wt 197.4 lb

## 2014-10-16 DIAGNOSIS — B009 Herpesviral infection, unspecified: Secondary | ICD-10-CM | POA: Diagnosis not present

## 2014-10-16 DIAGNOSIS — R350 Frequency of micturition: Secondary | ICD-10-CM

## 2014-10-16 DIAGNOSIS — N898 Other specified noninflammatory disorders of vagina: Secondary | ICD-10-CM

## 2014-10-16 LAB — POCT URINALYSIS DIP (MANUAL ENTRY)
BILIRUBIN UA: NEGATIVE
Bilirubin, UA: NEGATIVE
GLUCOSE UA: NEGATIVE
Leukocytes, UA: NEGATIVE
Nitrite, UA: NEGATIVE
PROTEIN UA: NEGATIVE
SPEC GRAV UA: 1.025
UROBILINOGEN UA: 0.2
pH, UA: 6

## 2014-10-16 MED ORDER — LIDOCAINE HCL 2 % EX GEL: 1.0000 "application " | CUTANEOUS | Status: DC | PRN

## 2014-10-16 NOTE — Patient Instructions (Signed)

## 2014-10-17 NOTE — Progress Notes (Signed)
GYNECOLOGY PROGRESS NOTE  Subjective:    Patient ID: Melissa Higgins, female    DOB: November 17, 1975, 39 y.o.   MRN: [id number]  HPI  Patient is a 39 y.o. female who presents with complaints of vaginal irritation.  Reports being seen ~1 week ago for vaginal irritation, labial swelling.  Was diagnosed with primary HSV infection.  Has been taking Acyclovir, but was not noting any change in symptoms, so was referred to GYN.  Patient notes that since that time, the irritation and swelling has begun to decrease.  Is still noting urinary frequency.  Still desires to be checked to ensure no other issues.   The following portions of the patient's history were reviewed and updated as appropriate: allergies, current medications, past family history, past medical history, past social history, past surgical history and problem list.  Review of Systems Pertinent items noted in HPI and remainder of comprehensive ROS otherwise negative.   Objective:   Blood pressure 114/76, pulse 73, temperature 98.4 F (36.9 C), temperature source Oral, resp. rate 14, height 5' 10.5" (1.791 m), weight 197 lb 6.4 oz (89.54 kg), last menstrual period 09/26/2014. General appearance: alert and no distress Abdomen: soft, non-tender; bowel sounds normal; no masses,  no organomegaly Pelvic: external genitalia with healing laceration at apex of left labia minora near clitoral hood. There is no bladder tenderness, positive findings: vaginal discharge:  normal and physiologic and scant and rectovaginal septum normal.  Bimanual exam not performed.  Extremities: extremities normal, atraumatic, no cyanosis or edema Neurologic: Grossly normal   Labs:  Results for orders placed or performed in visit on 10/16/14  POCT urinalysis dipstick  Result Value Ref Range   Color, UA yellow yellow   Clarity, UA clear clear   Glucose, UA negative negative   Bilirubin, UA negative negative   Ketones, POC UA negative negative   Spec Grav, UA 1.025     Blood, UA trace-intact (A) negative   pH, UA 6.0    Protein Ur, POC negative negative   Urobilinogen, UA 0.2    Nitrite, UA Negative Negative   Leukocytes, UA Negative Negative     Assessment:   Primary HSV infection Urinary frequency Vulvar laceration  Plan:   Discussion had on HSV, as patient still notes that she is unclear regarding details of diagnosis.  Vulvar laceration - patient notes discomfort around area.  Prescribed lidocaine gel for region to apply as needed over next few days.  Urinary frequency - unknown cause, patient with normal UA.  Denies dysuria, hematuria. Pregnancy unlikely as IUD in place and not other symptoms.  Likely due to pelvic irritation due to primary HSV infection, will likely resolve with time.  Follow up if symptoms do not improve or worsen.  Otherwise f/u as needed.   Rubie Maid, MD [facility]

## 2014-10-19 ENCOUNTER — Other Ambulatory Visit: Payer: Self-pay | Admitting: Internal Medicine

## 2014-11-21 ENCOUNTER — Encounter: Payer: Self-pay | Admitting: Family Medicine

## 2014-11-21 ENCOUNTER — Encounter: Payer: Self-pay | Admitting: Internal Medicine

## 2014-11-21 ENCOUNTER — Ambulatory Visit (INDEPENDENT_AMBULATORY_CARE_PROVIDER_SITE_OTHER): Payer: BC Managed Care – PPO | Admitting: Family Medicine

## 2014-11-21 VITALS — BP 120/80 | HR 85 | Temp 97.6°F | Wt 197.1 lb

## 2014-11-21 DIAGNOSIS — J0110 Acute frontal sinusitis, unspecified: Secondary | ICD-10-CM

## 2014-11-21 DIAGNOSIS — J019 Acute sinusitis, unspecified: Secondary | ICD-10-CM | POA: Insufficient documentation

## 2014-11-21 MED ORDER — DOXYCYCLINE HYCLATE 100 MG PO TABS: 100.0000 mg | ORAL_TABLET | Freq: Two times a day (BID) | ORAL | Status: DC

## 2014-11-21 NOTE — Patient Instructions (Signed)

## 2014-11-21 NOTE — Progress Notes (Signed)
Pre visit review using our clinic review tool, if applicable. No additional management support is needed unless otherwise documented below in the visit note. 

## 2014-11-21 NOTE — Progress Notes (Signed)
Patient ID: [name], female   DOB: [birth year], 39 y.o.   MRN: [id number]  Tommi Rumps, MD Phone: [phone]  Melissa Higgins is a 39 y.o. female who presents today for same-day appointment.  Sinusitis: Patient notes 2 weeks ago she started blowing green/yellow mucus out of her nose. This was persistent. Over the last several days she has developed increasing sinus pressure. She's also had some chest congestion and is coughing up yellow-green mucus. She's not had any earache, fever, wheezing, shortness of breath, or chest pain. She has a history of frequent sinus infections. She also notes she had bronchitis and pneumonia in September of this year. She was treated with a Z-Pak and cough medicine at that time.  PMH: nonsmoker.   ROS see history of present illness  Objective  Physical Exam Filed Vitals:   11/21/14 1620  BP: 120/80  Pulse: 85  Temp: 97.6 F (36.4 C)   Physical Exam  Constitutional: She is well-developed, well-nourished, and in no distress.  HENT:  Head: Normocephalic and atraumatic.  Right Ear: External ear normal.  Left Ear: External ear normal.  Mouth/Throat: Oropharynx is clear and moist. No oropharyngeal exudate.  Normal TMs bilaterally  Eyes: Conjunctivae are normal. Pupils are equal, round, and reactive to light.  Neck: Neck supple.  Cardiovascular: Normal rate, regular rhythm and normal heart sounds.  Exam reveals no gallop and no friction rub.   No murmur heard. Pulmonary/Chest: Effort normal and breath sounds normal. No respiratory distress. She has no wheezes. She has no rales.  Lymphadenopathy:    She has no cervical adenopathy.  Skin: Skin is warm and dry. She is not diaphoretic.     Assessment/Plan: Please see individual problem list.  Sinusitis, acute Symptoms consistent with sinusitis. Duration of symptoms indicate likely bacterial cause. Her vitals are stable. She is afebrile. Normal pulmonary exam. We will treat her with  doxycycline given her Augmentin allergy. Note she just started her menstrual period today. She will use Tessalon Perles that she has at home for cough. Given return precautions.    Meds ordered this encounter  Medications  . doxycycline (VIBRA-TABS) 100 MG tablet    Sig: Take 1 tablet (100 mg total) by mouth 2 (two) times daily.    Dispense:  14 tablet    Refill:  0   Tommi Rumps

## 2014-11-21 NOTE — Assessment & Plan Note (Signed)
Symptoms consistent with sinusitis. Duration of symptoms indicate likely bacterial cause. Her vitals are stable. She is afebrile. Normal pulmonary exam. We will treat her with doxycycline given her Augmentin allergy. Note she just started her menstrual period today. She will use Tessalon Perles that she has at home for cough. Given return precautions.

## 2014-11-26 ENCOUNTER — Encounter: Payer: Self-pay | Admitting: Internal Medicine

## 2014-11-26 MED ORDER — ACYCLOVIR 400 MG PO TABS: 400.0000 mg | ORAL_TABLET | Freq: Three times a day (TID) | ORAL | Status: DC

## 2014-11-26 NOTE — Telephone Encounter (Signed)
rx sent in for acyclovir #15 with one refill.

## 2014-12-22 ENCOUNTER — Encounter: Payer: Self-pay | Admitting: Internal Medicine

## 2014-12-22 ENCOUNTER — Ambulatory Visit (INDEPENDENT_AMBULATORY_CARE_PROVIDER_SITE_OTHER): Payer: BC Managed Care – PPO | Admitting: Internal Medicine

## 2014-12-22 VITALS — BP 120/80 | HR 75 | Temp 98.3°F | Resp 18 | Ht 70.5 in | Wt 209.0 lb

## 2014-12-22 DIAGNOSIS — K219 Gastro-esophageal reflux disease without esophagitis: Secondary | ICD-10-CM | POA: Diagnosis not present

## 2014-12-22 DIAGNOSIS — N939 Abnormal uterine and vaginal bleeding, unspecified: Secondary | ICD-10-CM

## 2014-12-22 DIAGNOSIS — Z1239 Encounter for other screening for malignant neoplasm of breast: Secondary | ICD-10-CM | POA: Diagnosis not present

## 2014-12-22 DIAGNOSIS — R87619 Unspecified abnormal cytological findings in specimens from cervix uteri: Secondary | ICD-10-CM

## 2014-12-22 DIAGNOSIS — Z658 Other specified problems related to psychosocial circumstances: Secondary | ICD-10-CM

## 2014-12-22 DIAGNOSIS — F439 Reaction to severe stress, unspecified: Secondary | ICD-10-CM

## 2014-12-22 MED ORDER — RANITIDINE HCL 150 MG PO TABS: 150.0000 mg | ORAL_TABLET | Freq: Every day | ORAL | Status: DC

## 2014-12-22 NOTE — Progress Notes (Signed)
Pre-visit discussion using our clinic review tool. No additional management support is needed unless otherwise documented below in the visit note.  

## 2014-12-22 NOTE — Progress Notes (Signed)
Patient ID: Melissa Higgins, female   DOB: 10-25-1975, 39 y.o.   MRN: [id number]   Subjective:    Patient ID: Melissa Higgins, female    DOB: Nov 18, 1975, 39 y.o.   MRN: [id number]  HPI  Patient with past history of hypercholesterolemia, allergies and depression.  She comes in today to follow up on these issues.  She reports increased stress with her job.  Discussed this with her.  She does not feel she needs any further intervention at this time.  No sinus issues not.  No increased cough or chest congestion.  No acid reflux.  No abdominal pain or cramping.  Bowels stable.  Menstrual period - 11/19/14.  No bleeding for two weeks and then started bleeding Thanksgiving.  Has been bleeding since.  Now having what would be her regular period.  She is seeing gyn.  They changed her ocp's in 08/2014.     Past Medical History  Diagnosis Date  . Hypercholesterolemia   . Environmental allergies     allergy shots  . Dysmenorrhea   . C. difficile colitis     augment use  . History of frequent urinary tract infections   . Depression    Past Surgical History  Procedure Laterality Date  . Nasal sinus surgery  2003   Family History  Problem Relation Age of Onset  . Hyperlipidemia Mother   . Asthma Father   . Hyperlipidemia Father   . Allergies Father    Social History   Social History  . Marital Status: Single    Spouse Name: N/A  . Number of Children: 0  . Years of Education: college   Social History Main Topics  . Smoking status: Never Smoker   . Smokeless tobacco: Never Used  . Alcohol Use: 0.0 oz/week    0 Standard drinks or equivalent per week     Comment: occasional  . Drug Use: No  . Sexual Activity: Not Asked   Other Topics Concern  . 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    Outpatient Encounter Prescriptions as of 12/22/2014  Medication Sig  . Loratadine 10 MG  CAPS Take 10 mg by mouth as needed.  . mupirocin ointment (BACTROBAN) 2 % Apply 22 application topically as needed.  . Norethin Ace-Eth Estrad-FE (MINASTRIN 24 FE) 1-20 MG-MCG(24) CHEW Chew 1 tablet by mouth daily.  . pantoprazole (PROTONIX) 40 MG tablet TAKE 1 TABLET BY MOUTH DAILY  . SUDAFED 12 HOUR 120 MG 12 hr tablet Take 1 tablet by mouth as needed.  . ranitidine (ZANTAC) 150 MG tablet Take 1 tablet (150 mg total) by mouth daily before breakfast.  . [DISCONTINUED] acyclovir (ZOVIRAX) 400 MG tablet Take 1 tablet (400 mg total) by mouth 3 (three) times daily.  . [DISCONTINUED] benzonatate (TESSALON) 200 MG capsule Take by mouth.  . [DISCONTINUED] citalopram (CELEXA) 10 MG tablet TAKE 1 TABLET(10 MG) BY MOUTH DAILY  . [DISCONTINUED] doxycycline (VIBRA-TABS) 100 MG tablet Take 1 tablet (100 mg total) by mouth 2 (two) times daily.  . [DISCONTINUED] HYDROcodone-homatropine (HYCODAN) 5-1.5 MG/5ML syrup Take by mouth.  . [DISCONTINUED] lidocaine (XYLOCAINE) 2 % jelly Apply 1 application topically as needed.   No facility-administered encounter medications on file as of 12/22/2014.    Review of Systems  Constitutional: Negative for activity change and appetite change.  HENT: Negative for congestion and sinus  pressure.   Eyes: Negative for pain and visual disturbance.  Respiratory: Negative for cough, chest tightness and shortness of breath.   Cardiovascular: Negative for chest pain, palpitations and leg swelling.  Gastrointestinal: Negative for nausea, vomiting, abdominal pain and diarrhea.  Genitourinary: Negative for dysuria and difficulty urinating.  Musculoskeletal: Negative for back pain and joint swelling.  Skin: Negative for color change and rash.  Neurological: Negative for dizziness, light-headedness and headaches.  Psychiatric/Behavioral: Negative for dysphoric mood and agitation.       Increased stress as outlined.         Objective:    Physical Exam  Constitutional: She  appears well-developed and well-nourished. No distress.  HENT:  Nose: Nose normal.  Mouth/Throat: Oropharynx is clear and moist.  Eyes: Conjunctivae are normal. Right eye exhibits no discharge. Left eye exhibits no discharge.  Neck: Neck supple. No thyromegaly present.  Cardiovascular: Normal rate and regular rhythm.   Pulmonary/Chest: Breath sounds normal. No respiratory distress. She has no wheezes.  Abdominal: Soft. Bowel sounds are normal. There is no tenderness.  Musculoskeletal: She exhibits no edema or tenderness.  Lymphadenopathy:    She has no cervical adenopathy.  Skin: No rash noted. No erythema.  Psychiatric: She has a normal mood and affect. Her behavior is normal.    BP 120/80 mmHg  Pulse 75  Temp(Src) 98.3 F (36.8 C) (Oral)  Resp 18  Ht 5' 10.5" (1.791 m)  Wt 209 lb (94.802 kg)  BMI 29.55 kg/m2  SpO2 98%  LMP 12/18/2014 (Exact Date) Wt Readings from Last 3 Encounters:  12/22/14 209 lb (94.802 kg)  11/21/14 197 lb 1.9 oz (89.413 kg)  10/16/14 197 lb 6.4 oz (89.54 kg)     Lab Results  Component Value Date   WBC 6.6 09/26/2013   HGB 14.3 09/26/2013   HCT 42.6 09/26/2013   PLT 394.0 09/26/2013   GLUCOSE 90 04/09/2014   CHOL 185 04/09/2014   TRIG 93.0 04/09/2014   HDL 48.00 04/09/2014   LDLCALC 118* 04/09/2014   ALT 24 04/09/2014   AST 21 04/09/2014   NA 138 04/09/2014   K 3.8 04/09/2014   CL 106 04/09/2014   CREATININE 0.81 04/09/2014   BUN 17 04/09/2014   CO2 27 04/09/2014   TSH 1.07 09/26/2013       Assessment & Plan:   Problem List Items Addressed This Visit    Abnormal Pap smear of cervix    Followed by gyn.        GERD (gastroesophageal reflux disease)    No problems with acid reflux reported on current regimen.  Follow.       Relevant Medications   ranitidine (ZANTAC) 150 MG tablet   Stress    Increased stress as outlined.  Discussed with her today.  Desires no further intervention at this time.  Follow.        Vaginal  bleeding    Periods irregular as outlined.  On ocp's.  Seeing gyn.  Will f/u with gyn regarding her periods.         Other Visit Diagnoses    Screening breast examination    -  Primary    Relevant Orders    MM DIGITAL SCREENING BILATERAL        Einar Pheasant, MD

## 2014-12-29 ENCOUNTER — Encounter: Payer: Self-pay | Admitting: Internal Medicine

## 2014-12-29 NOTE — Assessment & Plan Note (Signed)
Increased stress as outlined.  Discussed with her today.  Desires no further intervention at this time.  Follow.

## 2014-12-29 NOTE — Assessment & Plan Note (Signed)
No problems with acid reflux reported on current regimen.  Follow.

## 2014-12-29 NOTE — Assessment & Plan Note (Signed)
Periods irregular as outlined.  On ocp's.  Seeing gyn.  Will f/u with gyn regarding her periods.

## 2014-12-29 NOTE — Assessment & Plan Note (Signed)
Followed by gyn

## 2015-01-13 ENCOUNTER — Encounter: Payer: Self-pay | Admitting: Obstetrics and Gynecology

## 2015-01-13 ENCOUNTER — Ambulatory Visit
Admission: RE | Admit: 2015-01-13 | Discharge: 2015-01-13 | Disposition: A | Discharge status: Home / Self Care | Payer: BC Managed Care – PPO | Source: Ambulatory Visit | Attending: Internal Medicine | Admitting: Internal Medicine

## 2015-01-13 DIAGNOSIS — Z1231 Encounter for screening mammogram for malignant neoplasm of breast: Secondary | ICD-10-CM | POA: Insufficient documentation

## 2015-01-13 DIAGNOSIS — Z1239 Encounter for other screening for malignant neoplasm of breast: Secondary | ICD-10-CM

## 2015-01-16 ENCOUNTER — Other Ambulatory Visit: Payer: BC Managed Care – PPO

## 2015-01-28 ENCOUNTER — Encounter: Payer: Self-pay | Admitting: Obstetrics and Gynecology

## 2015-01-28 DIAGNOSIS — N979 Female infertility, unspecified: Secondary | ICD-10-CM

## 2015-01-29 ENCOUNTER — Other Ambulatory Visit: Payer: Self-pay | Admitting: Obstetrics and Gynecology

## 2015-01-29 DIAGNOSIS — N979 Female infertility, unspecified: Secondary | ICD-10-CM

## 2015-02-04 ENCOUNTER — Other Ambulatory Visit: Payer: BC Managed Care – PPO

## 2015-02-05 LAB — LUTEINIZING HORMONE: LH: 4 m[IU]/mL

## 2015-02-05 LAB — FOLLICLE STIMULATING HORMONE: FSH: 5.3 m[IU]/mL

## 2015-02-13 ENCOUNTER — Encounter: Payer: Self-pay | Admitting: Internal Medicine

## 2015-03-23 ENCOUNTER — Encounter: Payer: Self-pay | Admitting: Obstetrics and Gynecology

## 2015-03-26 ENCOUNTER — Encounter: Payer: Self-pay | Admitting: Obstetrics and Gynecology

## 2015-03-26 ENCOUNTER — Other Ambulatory Visit: Payer: Self-pay | Admitting: Obstetrics and Gynecology

## 2015-03-26 ENCOUNTER — Ambulatory Visit (INDEPENDENT_AMBULATORY_CARE_PROVIDER_SITE_OTHER): Payer: BC Managed Care – PPO | Admitting: Obstetrics and Gynecology

## 2015-03-26 VITALS — BP 106/69 | HR 83 | Ht 70.5 in | Wt 211.6 lb

## 2015-03-26 DIAGNOSIS — N760 Acute vaginitis: Secondary | ICD-10-CM | POA: Diagnosis not present

## 2015-03-26 DIAGNOSIS — B009 Herpesviral infection, unspecified: Secondary | ICD-10-CM

## 2015-03-26 MED ORDER — METRONIDAZOLE 500 MG PO TABS: 500.0000 mg | ORAL_TABLET | Freq: Two times a day (BID) | ORAL | Status: DC

## 2015-03-26 MED ORDER — VALACYCLOVIR HCL 1 G PO TABS: 1000.0000 mg | ORAL_TABLET | Freq: Every day | ORAL | Status: DC

## 2015-03-26 NOTE — Patient Instructions (Signed)
Genital Herpes Genital herpes is a common sexually transmitted infection (STI) that is caused by a virus. The virus is spread from person to person through sexual contact. Infection can cause itching, blisters, and sores in the genital area or rectal area. This is called an outbreak. It affects both men and women. Genital herpes is particularly concerning for pregnant women because the virus can be passed to the baby during delivery and cause serious problems. Genital herpes is also a concern for people with a weakened defense (immune) system. Symptoms of genital herpes may last several days and then go away. However, the virus remains in your body, so you may have more outbreaks of symptoms in the future. The time between outbreaks varies and can be months or years. CAUSES Genital herpes is caused by a virus called herpes simplex virus (HSV) type 2 or HSV type 1. These viruses are contagious and are most often spread through sexual contact with an infected person. Sexual contact includes vaginal, anal, and oral sex. RISK FACTORS Risk factors for genital herpes include:  Being sexually active with multiple partners.  Having unprotected sex. SIGNS AND SYMPTOMS Symptoms may include:  Pain and itching in the genital area or rectal area.  Small red bumps that turn into blisters and then turn into sores.  Flu-like symptoms, including:  Fever.  Body aches.  Painful urination.  Vaginal discharge. DIAGNOSIS Genital herpes may be diagnosed by:  Physical exam.  Blood test.  Fluid culture test from an open sore. TREATMENT There is no cure for genital herpes. Oral antiviral medicines may be used to speed up healing and to help prevent the return of symptoms. These medicines can also help to reduce the spread of the virus to sexual partners. HOME CARE INSTRUCTIONS  Keep the affected areas dry and clean.  Take medicines only as directed by your health care provider.  Do not have sexual  contact during active infections. Genital herpes is contagious.  Practice safe sex. Latex condoms and female condoms may help to prevent the spread of the herpes virus.  Avoid rubbing or touching the blisters and sores. If you do touch the blister or sores:  Wash your hands thoroughly.  Do not touch your eyes afterward.  If you become pregnant, tell your health care provider if you have had genital herpes.  Keep all follow-up visits as directed by your health care provider. This is important. PREVENTION  Use condoms. Although anyone can contract genital herpes during sexual contact even with the use of a condom, a condom can provide some protection.  Avoid having multiple sexual partners.  Talk to your sexual partner about any symptoms and past history that either of you may have.  Get tested before you have sex. Ask your partner to do the same.  Recognize the symptoms of genital herpes. Do not have sexual contact if you notice these symptoms. SEEK MEDICAL CARE IF:  Your symptoms are not improving with medicine.  Your symptoms return.  You have new symptoms.  You have a fever.  You have abdominal pain.  You have redness, swelling, or pain in your eye. MAKE SURE YOU:  Understand these instructions.  Will watch your condition.  Will get help right away if you are not doing well or get worse.   This information is not intended to replace advice given to you by your health care provider. Make sure you discuss any questions you have with your health care provider.   Document Released: 12/25/1999   Document Revised: 01/17/2014 Document Reviewed: 05/14/2013 Elsevier Interactive Patient Education 2016 Elsevier Inc.     Bacterial Vaginosis Bacterial vaginosis is a vaginal infection that occurs when the normal balance of bacteria in the vagina is disrupted. It results from an overgrowth of certain bacteria. This is the most common vaginal infection in women of childbearing  age. Treatment is important to prevent complications, especially in pregnant women, as it can cause a premature delivery. CAUSES  Bacterial vaginosis is caused by an increase in harmful bacteria that are normally present in smaller amounts in the vagina. Several different kinds of bacteria can cause bacterial vaginosis. However, the reason that the condition develops is not fully understood. RISK FACTORS Certain activities or behaviors can put you at an increased risk of developing bacterial vaginosis, including:  Having a new sex partner or multiple sex partners.  Douching.  Using an intrauterine device (IUD) for contraception. Women do not get bacterial vaginosis from toilet seats, bedding, swimming pools, or contact with objects around them. SIGNS AND SYMPTOMS  Some women with bacterial vaginosis have no signs or symptoms. Common symptoms include:  Grey vaginal discharge.  A fishlike odor with discharge, especially after sexual intercourse.  Itching or burning of the vagina and vulva.  Burning or pain with urination. DIAGNOSIS  Your health care provider will take a medical history and examine the vagina for signs of bacterial vaginosis. A sample of vaginal fluid may be taken. Your health care provider will look at this sample under a microscope to check for bacteria and abnormal cells. A vaginal pH test may also be done.  TREATMENT  Bacterial vaginosis may be treated with antibiotic medicines. These may be given in the form of a pill or a vaginal cream. A second round of antibiotics may be prescribed if the condition comes back after treatment. Because bacterial vaginosis increases your risk for sexually transmitted diseases, getting treated can help reduce your risk for chlamydia, gonorrhea, HIV, and herpes. HOME CARE INSTRUCTIONS   Only take over-the-counter or prescription medicines as directed by your health care provider.  If antibiotic medicine was prescribed, take it as  directed. Make sure you finish it even if you start to feel better.  Tell all sexual partners that you have a vaginal infection. They should see their health care provider and be treated if they have problems, such as a mild rash or itching.  During treatment, it is important that you follow these instructions:  Avoid sexual activity or use condoms correctly.  Do not douche.  Avoid alcohol as directed by your health care provider.  Avoid breastfeeding as directed by your health care provider. SEEK MEDICAL CARE IF:   Your symptoms are not improving after 3 days of treatment.  You have increased discharge or pain.  You have a fever. MAKE SURE YOU:   Understand these instructions.  Will watch your condition.  Will get help right away if you are not doing well or get worse. FOR MORE INFORMATION  Centers for Disease Control and Prevention, Division of STD Prevention: [employer]raud[employer] American Sexual Health Association (ASHA): www.ashastd.org    This information is not intended to replace advice given to you by your health care provider. Make sure you discuss any questions you have with your health care provider.   Document Released: 12/27/2004 Document Revised: 01/17/2014 Document Reviewed: 08/08/2012 Elsevier Interactive Patient Education [employer].

## 2015-03-26 NOTE — Progress Notes (Signed)
    GYNECOLOGY PROGRESS NOTE  Subjective:    Patient ID: Melissa Higgins, female    DOB: 1975/11/29, 40 y.o.   MRN: [id number]  HPI  Patient is a 40 y.o. P0 female who presents for labial lesion.  Lesion has been present for 3-4 days.  Associated symptoms include: urinary frequency, white discharge with mild odor, itching.  Patient denies drainage from lesion, pain.  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  The following portions of the patient's history were reviewed and updated as appropriate: allergies, current medications, past family history, past medical history, past social history, past surgical history and problem list.  Review of Systems Pertinent items noted in HPI and remainder of comprehensive ROS otherwise negative.   Objective:   Blood pressure 106/69, pulse 83, height 5' 10.5" (1.791 m), weight 211 lb 9.6 oz (95.981 kg). General appearance: alert and no distress Abdomen: soft, non-tender; bowel sounds normal; no masses,  no organomegaly Pelvic: cervix normal in appearance, no adnexal masses or tenderness, no cervical motion tenderness, rectovaginal septum normal, uterus normal size, shape, and consistency and vagina with moderate amount of white thin discharge.  Small ulcerated lesion at clitoral hood (slightly to left).  Positive whiff test.  Extremities: extremities normal, atraumatic, no cyanosis or edema Neurologic: Grossly normal  Microscopic wet-mount exam shows numerous clue cells and normal epithelial cells. Negative for hyphae, yeast.    Assessment:   HSV lesion (outbreak) H/o recurrent vaginitis Bacterial vaginosis   Plan:   Discussion had with patient regarding HSV lesion.  Since this is 3rd outbreak, patient needs daily prophylaxis.  Started on Valtrex 1000 mg daily (for treatment of current lesion and for prophylaxis).   H/o recurrent vaginitis with new bacterial vaginosis infection - will treat with Flagyl  500 mg BID for current infection, then begin on 6 month long term prophylactic therapy with course of Flagyl monthly.  Also reiterated hygiene measures, cutting down on sweet intake, starting a vaginal probiotic. RTC in 6 months, or as needed.    Melissa Maid, MD [facility]

## 2015-03-27 ENCOUNTER — Ambulatory Visit: Payer: BC Managed Care – PPO | Admitting: Internal Medicine

## 2015-04-02 ENCOUNTER — Ambulatory Visit (INDEPENDENT_AMBULATORY_CARE_PROVIDER_SITE_OTHER): Payer: BC Managed Care – PPO | Admitting: Internal Medicine

## 2015-04-02 ENCOUNTER — Encounter: Payer: Self-pay | Admitting: Internal Medicine

## 2015-04-02 VITALS — BP 138/80 | HR 74 | Temp 97.9°F | Resp 18 | Ht 70.5 in | Wt 212.4 lb

## 2015-04-02 DIAGNOSIS — N760 Acute vaginitis: Secondary | ICD-10-CM | POA: Diagnosis not present

## 2015-04-02 DIAGNOSIS — B009 Herpesviral infection, unspecified: Secondary | ICD-10-CM

## 2015-04-02 DIAGNOSIS — G4700 Insomnia, unspecified: Secondary | ICD-10-CM

## 2015-04-02 DIAGNOSIS — Z658 Other specified problems related to psychosocial circumstances: Secondary | ICD-10-CM | POA: Diagnosis not present

## 2015-04-02 DIAGNOSIS — F439 Reaction to severe stress, unspecified: Secondary | ICD-10-CM

## 2015-04-02 NOTE — Progress Notes (Signed)
Pre-visit discussion using our clinic review tool. No additional management support is needed unless otherwise documented below in the visit note.  

## 2015-04-02 NOTE — Patient Instructions (Signed)
nasacort nasal spray - 2 sprays each nostril one time per day.  Do this in the evening.   

## 2015-04-02 NOTE — Progress Notes (Signed)
Patient ID: Melissa Higgins, female   DOB: 19-Mar-1975, 40 y.o.   MRN: [id number]   Subjective:    Patient ID: Melissa Higgins, female    DOB: 1975-04-10, 40 y.o.   MRN: [id number]  HPI  Patient here for a scheduled follow up.  Was recently evaluated by gyn.  Being treated for herpes flare and Gvag.  Going to start suppressive medication for herpes.  Tries to stay active.  Has not been exercising regularly.  No chest pain or tightness.  No sob.  No abdominal pain or cramping. Bowels stable.  Increased stress with her work.  Discussed with her today.  She desires no further intervention at this time.  Just got engaged.     Past Medical History  Diagnosis Date  . Hypercholesterolemia   . Environmental allergies     allergy shots  . Dysmenorrhea   . C. difficile colitis     augment use  . History of frequent urinary tract infections   . Depression    Past Surgical History  Procedure Laterality Date  . Nasal sinus surgery  2003   Family History  Problem Relation Age of Onset  . Hyperlipidemia Mother   . Asthma Father   . Hyperlipidemia Father   . Allergies Father    Social History   Social History  . Marital Status: Single    Spouse Name: N/A  . Number of Children: 0  . Years of Education: college   Social History Main Topics  . Smoking status: Never Smoker   . Smokeless tobacco: Never Used  . Alcohol Use: 0.0 oz/week    0 Standard drinks or equivalent per week     Comment: occasional  . Drug Use: No  . Sexual Activity: Yes    Birth Control/ Protection: Pill   Other Topics Concern  . 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    Outpatient Encounter Prescriptions as of 04/02/2015  Medication Sig  . metroNIDAZOLE (FLAGYL) 500 MG tablet Take 1 tablet (500 mg total) by mouth 2 (two) times daily. Take 2 times daily for 7 days each month for 6 months.  . Norethin  Ace-Eth Estrad-FE (MINASTRIN 24 FE) 1-20 MG-MCG(24) CHEW Chew 1 tablet by mouth daily.  . valACYclovir (VALTREX) 1000 MG tablet Take 1 tablet (1,000 mg total) by mouth daily.   No facility-administered encounter medications on file as of 04/02/2015.    Review of Systems  Constitutional: Negative for appetite change and unexpected weight change.  HENT: Negative for congestion and sinus pressure.   Respiratory: Negative for cough, chest tightness and shortness of breath.   Cardiovascular: Negative for chest pain, palpitations and leg swelling.  Gastrointestinal: Negative for nausea, vomiting, abdominal pain and diarrhea.  Genitourinary: Negative for dysuria and difficulty urinating.  Musculoskeletal: Negative for back pain and joint swelling.  Skin: Negative for color change and rash.  Neurological: Negative for dizziness, light-headedness and headaches.  Psychiatric/Behavioral: Negative for dysphoric mood and agitation.       Increased stress as outlined.         Objective:     Blood pressure rechecked by me:  112/78  Physical Exam  Constitutional: She appears well-developed and well-nourished. No distress.  HENT:  Nose: Nose normal.  Mouth/Throat: Oropharynx is clear and moist.  Neck: Neck supple. No thyromegaly present.  Cardiovascular: Normal rate and regular  rhythm.   Pulmonary/Chest: Breath sounds normal. No respiratory distress. She has no wheezes.  Abdominal: Soft. Bowel sounds are normal. There is no tenderness.  Musculoskeletal: She exhibits no edema or tenderness.  Lymphadenopathy:    She has no cervical adenopathy.  Skin: No rash noted. No erythema.  Psychiatric: She has a normal mood and affect. Her behavior is normal.    BP 138/80 mmHg  Pulse 74  Temp(Src) 97.9 F (36.6 C) (Oral)  Resp 18  Ht 5' 10.5" (1.791 m)  Wt 212 lb 6 oz (96.333 kg)  BMI 30.03 kg/m2  SpO2 98%  LMP 03/09/2015 Wt Readings from Last 3 Encounters:  04/02/15 212 lb 6 oz (96.333 kg)    03/26/15 211 lb 9.6 oz (95.981 kg)  12/22/14 209 lb (94.802 kg)     Lab Results  Component Value Date   WBC 6.6 09/26/2013   HGB 14.3 09/26/2013   HCT 42.6 09/26/2013   PLT 394.0 09/26/2013   GLUCOSE 90 04/09/2014   CHOL 185 04/09/2014   TRIG 93.0 04/09/2014   HDL 48.00 04/09/2014   LDLCALC 118* 04/09/2014   ALT 24 04/09/2014   AST 21 04/09/2014   NA 138 04/09/2014   K 3.8 04/09/2014   CL 106 04/09/2014   CREATININE 0.81 04/09/2014   BUN 17 04/09/2014   CO2 27 04/09/2014   TSH 1.07 09/26/2013    Mm Digital Screening Bilateral  01/14/2015  CLINICAL DATA:  Screening. EXAM: DIGITAL SCREENING BILATERAL MAMMOGRAM WITH CAD COMPARISON:  Previous exam(s). ACR Breast Density Category c: The breast tissue is heterogeneously dense, which may obscure small masses. FINDINGS: There are no findings suspicious for malignancy. Images were processed with CAD. IMPRESSION: No mammographic evidence of malignancy. A result letter of this screening mammogram will be mailed directly to the patient. RECOMMENDATION: Screening mammogram in one year. (Code:SM-B-01Y) BI-RADS CATEGORY  1: Negative. Electronically Signed   By: Lovey Newcomer M.D.   On: 01/14/2015 08:31       Assessment & Plan:   Problem List Items Addressed This Visit    Herpes simplex type II infection (Chronic)    Planning to start suppressive medication.  Follow.        Insomnia    Sleeping better.  Follow.       Stress    Increased stress as outlined.  Discussed with her today.  She desires no further intervention.  Follow.        Vaginitis - Primary    Just saw gyn.  Being treated for herpes flare and G. Vag.  Follow.            Einar Pheasant, MD

## 2015-04-04 ENCOUNTER — Encounter: Payer: Self-pay | Admitting: Internal Medicine

## 2015-04-04 NOTE — Assessment & Plan Note (Addendum)
Planning to start suppressive medication.  Follow.

## 2015-04-04 NOTE — Assessment & Plan Note (Signed)
Increased stress as outlined.  Discussed with her today. She desires no further intervention.  Follow.   

## 2015-04-04 NOTE — Assessment & Plan Note (Signed)
Just saw gyn.  Being treated for herpes flare and G. Vag.  Follow.

## 2015-04-04 NOTE — Assessment & Plan Note (Signed)
Sleeping better.  Follow.  

## 2015-04-12 ENCOUNTER — Encounter: Payer: Self-pay | Admitting: Internal Medicine

## 2015-05-04 ENCOUNTER — Encounter: Payer: Self-pay | Admitting: Internal Medicine

## 2015-05-07 ENCOUNTER — Ambulatory Visit (INDEPENDENT_AMBULATORY_CARE_PROVIDER_SITE_OTHER): Payer: BC Managed Care – PPO | Admitting: Internal Medicine

## 2015-05-07 ENCOUNTER — Encounter: Payer: Self-pay | Admitting: Internal Medicine

## 2015-05-07 VITALS — BP 102/72 | HR 78 | Temp 98.2°F | Resp 12 | Ht 70.5 in | Wt 210.0 lb

## 2015-05-07 DIAGNOSIS — G4700 Insomnia, unspecified: Secondary | ICD-10-CM

## 2015-05-07 DIAGNOSIS — K219 Gastro-esophageal reflux disease without esophagitis: Secondary | ICD-10-CM

## 2015-05-07 DIAGNOSIS — N926 Irregular menstruation, unspecified: Secondary | ICD-10-CM

## 2015-05-07 DIAGNOSIS — R87619 Unspecified abnormal cytological findings in specimens from cervix uteri: Secondary | ICD-10-CM

## 2015-05-07 DIAGNOSIS — F439 Reaction to severe stress, unspecified: Secondary | ICD-10-CM

## 2015-05-07 DIAGNOSIS — Z658 Other specified problems related to psychosocial circumstances: Secondary | ICD-10-CM | POA: Diagnosis not present

## 2015-05-07 MED ORDER — FLUOXETINE HCL 10 MG PO CAPS: 10.0000 mg | ORAL_CAPSULE | Freq: Every day | ORAL | Status: DC

## 2015-05-07 MED ORDER — ESOMEPRAZOLE MAGNESIUM 40 MG PO CPDR: 40.0000 mg | DELAYED_RELEASE_CAPSULE | Freq: Two times a day (BID) | ORAL | Status: DC

## 2015-05-07 NOTE — Progress Notes (Signed)
Patient ID: Melissa Higgins, female   DOB: [birth year], 40 y.o.   MRN: [id number]   Subjective:    Patient ID: Melissa Higgins, female    DOB: May 11, 1975, 40 y.o.   MRN: [id number]  HPI  Patient here as a work in to discuss increased stress, anxiety and some reactive depression.  Increased stress with her job.  Discussed with her today.  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  Discussed with her today. She is still going out, but does not enjoy doing some things she previously enjoyed.  She is planning to get married in November.  Increased stress related to this as well.  Discussed at length with her today.  She does feel needs to be on something to help her through this.  Previously on citalopram.  When she restarted this recently, states it was too much for her.  Discussed other treatment options.  She is taking ocp's.  Has been on for two months now.  Overdue to start her period.  Taking her pills regularly now.  She is not sleeping.  Can fall asleep, but is having trouble staying asleep.  Using oils which has helped her.     Past Medical History  Diagnosis Date  . Hypercholesterolemia   . Environmental allergies     allergy shots  . Dysmenorrhea   . C. difficile colitis     augment use  . History of frequent urinary tract infections   . Depression    Past Surgical History  Procedure Laterality Date  . Nasal sinus surgery  2003   Family History  Problem Relation Age of Onset  . Hyperlipidemia Mother   . Asthma Father   . Hyperlipidemia Father   . Allergies Father    Social History   Social History  . Marital Status: Single    Spouse Name: N/A  . Number of Children: 0  . Years of Education: college   Social History Main Topics  . Smoking status: Never Smoker   . Smokeless tobacco: Never Used  . Alcohol Use: 0.0 oz/week    0 Standard drinks or equivalent per week     Comment: occasional  . Drug Use: No  . Sexual Activity: Yes    Birth Control/  Protection: Pill   Other Topics Concern  . 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    Outpatient Encounter Prescriptions as of 05/07/2015  Medication Sig  . metroNIDAZOLE (FLAGYL) 500 MG tablet Take 1 tablet (500 mg total) by mouth 2 (two) times daily. Take 2 times daily for 7 days each month for 6 months.  . Norethin Ace-Eth Estrad-FE (MINASTRIN 24 FE) 1-20 MG-MCG(24) CHEW Chew 1 tablet by mouth daily.  . valACYclovir (VALTREX) 1000 MG tablet Take 1 tablet (1,000 mg total) by mouth daily.  . [DISCONTINUED] ranitidine (ZANTAC) 150 MG tablet TK 1 T PO  QD BEFORE BREAKFAST  . esomeprazole (NEXIUM) 40 MG capsule Take 1 capsule (40 mg total) by mouth 2 (two) times daily before a meal.  . FLUoxetine (PROZAC) 10 MG capsule Take 1 capsule (10 mg total) by mouth daily.   No facility-administered encounter medications on file as of 05/07/2015.    Review of Systems  Constitutional: Negative for appetite change.  HENT: Negative for congestion and sinus pressure.   Respiratory: Negative for cough, chest tightness and shortness  of breath.   Cardiovascular: Negative for chest pain, palpitations and leg swelling.  Gastrointestinal: Negative for nausea, vomiting, abdominal pain and diarrhea.  Genitourinary: Negative for dysuria and difficulty urinating.  Musculoskeletal: Negative for back pain and joint swelling.  Skin: Negative for color change and rash.  Neurological: Negative for dizziness, light-headedness and headaches.  Psychiatric/Behavioral: Negative for dysphoric mood and agitation.       Objective:    Physical Exam  Constitutional: She appears well-developed and well-nourished. No distress.  HENT:  Nose: Nose normal.  Mouth/Throat: Oropharynx is clear and moist.  Neck: Neck supple. No thyromegaly present.  Cardiovascular: Normal rate and regular rhythm.   Pulmonary/Chest:  Breath sounds normal. No respiratory distress. She has no wheezes.  Abdominal: Soft. Bowel sounds are normal. There is no tenderness.  Musculoskeletal: She exhibits no edema or tenderness.  Lymphadenopathy:    She has no cervical adenopathy.  Skin: No rash noted. No erythema.  Psychiatric: She has a normal mood and affect. Her behavior is normal.    BP 102/72 mmHg  Pulse 78  Temp(Src) 98.2 F (36.8 C) (Oral)  Resp 12  Ht 5' 10.5" (1.791 m)  Wt 210 lb (95.255 kg)  BMI 29.70 kg/m2  SpO2 98% Wt Readings from Last 3 Encounters:  05/07/15 210 lb (95.255 kg)  04/02/15 212 lb 6 oz (96.333 kg)  03/26/15 211 lb 9.6 oz (95.981 kg)     Lab Results  Component Value Date   WBC 6.6 09/26/2013   HGB 14.3 09/26/2013   HCT 42.6 09/26/2013   PLT 394.0 09/26/2013   GLUCOSE 90 04/09/2014   CHOL 185 04/09/2014   TRIG 93.0 04/09/2014   HDL 48.00 04/09/2014   LDLCALC 118* 04/09/2014   ALT 24 04/09/2014   AST 21 04/09/2014   NA 138 04/09/2014   K 3.8 04/09/2014   CL 106 04/09/2014   CREATININE 0.81 04/09/2014   BUN 17 04/09/2014   CO2 27 04/09/2014   TSH 1.07 09/26/2013    Mm Digital Screening Bilateral  01/14/2015  CLINICAL DATA:  Screening. EXAM: DIGITAL SCREENING BILATERAL MAMMOGRAM WITH CAD COMPARISON:  Previous exam(s). ACR Breast Density Category c: The breast tissue is heterogeneously dense, which may obscure small masses. FINDINGS: There are no findings suspicious for malignancy. Images were processed with CAD. IMPRESSION: No mammographic evidence of malignancy. A result letter of this screening mammogram will be mailed directly to the patient. RECOMMENDATION: Screening mammogram in one year. (Code:SM-B-01Y) BI-RADS CATEGORY  1: Negative. Electronically Signed   By: Lovey Newcomer M.D.   On: 01/14/2015 08:31       Assessment & Plan:   Problem List Items Addressed This Visit    Abnormal Pap smear of cervix    Seeing gyn.        GERD (gastroesophageal reflux disease) - Primary     Increased acid reflux.  Taking zantac.  Persistent symptoms.  Start nexium.        Relevant Medications   esomeprazole (NEXIUM) 40 MG capsule   Insomnia    Start prozac.  See if this helps sleep.  Follow.        Menstrual irregularity    A little late starting.  On ocp's.  Was questioning if stress could be contributing.  Has taken pregnancy test.  Negative.  Will recheck this pm.        Stress    Discussed with her today.  Seeing Ellin Saba.  Saw him today.  Feels needs to be on  something to help level things out.  Start prozac as directed.  Follow closely.  Get her back in soon to reassess.            Einar Pheasant, MD

## 2015-05-07 NOTE — Progress Notes (Signed)
Pre visit review using our clinic review tool, if applicable. No additional management support is needed unless otherwise documented below in the visit note. 

## 2015-05-07 NOTE — Patient Instructions (Signed)
Take nexium 30 minutes before breakfast and 30 minutes before your evening meal.    Take prozac 10mg  in the am.

## 2015-05-10 ENCOUNTER — Encounter: Payer: Self-pay | Admitting: Internal Medicine

## 2015-05-10 DIAGNOSIS — N926 Irregular menstruation, unspecified: Secondary | ICD-10-CM | POA: Insufficient documentation

## 2015-05-10 NOTE — Assessment & Plan Note (Signed)
A little late starting.  On ocp's.  Was questioning if stress could be contributing.  Has taken pregnancy test.  Negative.  Will recheck this pm.

## 2015-05-10 NOTE — Assessment & Plan Note (Signed)
Seeing gyn.  

## 2015-05-10 NOTE — Assessment & Plan Note (Signed)
Start prozac.  See if this helps sleep.  Follow.

## 2015-05-10 NOTE — Assessment & Plan Note (Signed)
Increased acid reflux.  Taking zantac.  Persistent symptoms.  Start nexium.

## 2015-05-10 NOTE — Assessment & Plan Note (Signed)
Discussed with her today.  Seeing Ellin Saba.  Saw him today.  Feels needs to be on something to help level things out.  Start prozac as directed.  Follow closely.  Get her back in soon to reassess.

## 2015-05-14 ENCOUNTER — Encounter: Payer: Self-pay | Admitting: Obstetrics and Gynecology

## 2015-05-18 ENCOUNTER — Encounter: Payer: Self-pay | Admitting: Internal Medicine

## 2015-05-18 ENCOUNTER — Other Ambulatory Visit: Payer: Self-pay | Admitting: Internal Medicine

## 2015-05-18 MED ORDER — FLUOXETINE HCL 20 MG PO TABS: 20.0000 mg | ORAL_TABLET | Freq: Every day | ORAL | Status: DC

## 2015-05-18 NOTE — Progress Notes (Signed)
rx sent in for prozac 20mg  tablets #30 with 1 refill.

## 2015-05-22 ENCOUNTER — Telehealth: Payer: Self-pay | Admitting: Internal Medicine

## 2015-05-22 NOTE — Telephone Encounter (Signed)
Lm on vm to call back and schedule a follow up appt around the end of May beginning of June.Marland Kitchen

## 2015-06-01 ENCOUNTER — Encounter: Payer: Self-pay | Admitting: Internal Medicine

## 2015-06-04 ENCOUNTER — Telehealth: Payer: Self-pay | Admitting: Obstetrics and Gynecology

## 2015-06-04 DIAGNOSIS — Z3041 Encounter for surveillance of contraceptive pills: Secondary | ICD-10-CM

## 2015-06-04 NOTE — Telephone Encounter (Signed)
Melissa Higgins called saying she went to pick up a refill of her current birth control medication and was told there will now be a $90 copay. Per her pharmacy, there is now a generic version of her birth control and she's wondering if that can be prescribed for her instead so she won't have a copay. She left the medication at the pharmacy. She'd like a phone call regarding this if needed.  Pt's ph# [phone] Thank you.

## 2015-06-05 MED ORDER — NORETHIN ACE-ETH ESTRAD-FE 1-20 MG-MCG(24) PO CAPS: 20.0000 mg | ORAL_CAPSULE | Freq: Every day | ORAL | Status: DC

## 2015-06-05 NOTE — Telephone Encounter (Signed)
New RX sent in for [facility] which as now taken the place of Minastrin which pt was on previously.

## 2015-06-18 ENCOUNTER — Telehealth: Payer: Self-pay | Admitting: Internal Medicine

## 2015-06-18 ENCOUNTER — Ambulatory Visit (INDEPENDENT_AMBULATORY_CARE_PROVIDER_SITE_OTHER): Payer: BC Managed Care – PPO | Admitting: Internal Medicine

## 2015-06-18 ENCOUNTER — Encounter: Payer: Self-pay | Admitting: Internal Medicine

## 2015-06-18 VITALS — BP 102/68 | HR 69 | Temp 97.9°F | Wt 208.2 lb

## 2015-06-18 DIAGNOSIS — K219 Gastro-esophageal reflux disease without esophagitis: Secondary | ICD-10-CM | POA: Diagnosis not present

## 2015-06-18 DIAGNOSIS — F439 Reaction to severe stress, unspecified: Secondary | ICD-10-CM

## 2015-06-18 DIAGNOSIS — N3000 Acute cystitis without hematuria: Secondary | ICD-10-CM | POA: Diagnosis not present

## 2015-06-18 DIAGNOSIS — N926 Irregular menstruation, unspecified: Secondary | ICD-10-CM

## 2015-06-18 DIAGNOSIS — Z658 Other specified problems related to psychosocial circumstances: Secondary | ICD-10-CM | POA: Diagnosis not present

## 2015-06-18 DIAGNOSIS — M7989 Other specified soft tissue disorders: Secondary | ICD-10-CM

## 2015-06-18 DIAGNOSIS — Z13220 Encounter for screening for lipoid disorders: Secondary | ICD-10-CM

## 2015-06-18 DIAGNOSIS — L539 Erythematous condition, unspecified: Secondary | ICD-10-CM

## 2015-06-18 MED ORDER — CEPHALEXIN 500 MG PO CAPS: 500.0000 mg | ORAL_CAPSULE | Freq: Three times a day (TID) | ORAL | Status: DC

## 2015-06-18 NOTE — Telephone Encounter (Signed)
Sent message for update.   

## 2015-06-18 NOTE — Progress Notes (Signed)
Patient ID: Melissa Higgins, female   DOB: Mar 02, 1975, 40 y.o.   MRN: [id number]   Subjective:    Patient ID: Melissa Higgins, female    DOB: 1975-07-06, 40 y.o.   MRN: [id number]  HPI  Patient here for a scheduled follow up.  She is off prozac.  School is ending for the year.  She feels the stress will be better once out of school.  Discussed at length with her today.  She tries to stay active.  No cardiac symptoms with increased activity or exertion.  No sob.  On nexium.  No increased acid reflux reported.  No abdominal pain or cramping.  Bowels stable.  Reports noticing over the last 24-48 hours increased soreness and tenderness right lower extremity - and redness.  Localized to anterior shin.  No injury.  No trauma.  Back on ocp's 03/27/15.  Still with period change.  No period since starting.  Denies possibility of being pregnant.  Urinary symptoms resolved.  Treated recently.     Past Medical History  Diagnosis Date  . Hypercholesterolemia   . Environmental allergies     allergy shots  . Dysmenorrhea   . C. difficile colitis     augment use  . History of frequent urinary tract infections   . Depression    Past Surgical History  Procedure Laterality Date  . Nasal sinus surgery  2003   Family History  Problem Relation Age of Onset  . Hyperlipidemia Mother   . Asthma Father   . Hyperlipidemia Father   . Allergies Father    Social History   Social History  . Marital Status: Single    Spouse Name: N/A  . Number of Children: 0  . Years of Education: college   Social History Main Topics  . Smoking status: Never Smoker   . Smokeless tobacco: Never Used  . Alcohol Use: 0.0 oz/week    0 Standard drinks or equivalent per week     Comment: occasional  . Drug Use: No  . Sexual Activity: Yes    Birth Control/ Protection: Pill   Other Topics Concern  . 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    Outpatient Encounter Prescriptions as of 06/18/2015  Medication Sig  . esomeprazole (NEXIUM) 40 MG capsule Take 1 capsule (40 mg total) by mouth 2 (two) times daily before a meal.  . metroNIDAZOLE (FLAGYL) 500 MG tablet Take 1 tablet (500 mg total) by mouth 2 (two) times daily. Take 2 times daily for 7 days each month for 6 months.  . Norethin Ace-Eth Estrad-FE (TAYTULLA) 1-20 MG-MCG(24) CAPS Take 20 mg by mouth daily.  . valACYclovir (VALTREX) 1000 MG tablet Take 1 tablet (1,000 mg total) by mouth daily.  . cephALEXin (KEFLEX) 500 MG capsule Take 1 capsule (500 mg total) by mouth 3 (three) times daily.  . [DISCONTINUED] FLUoxetine (PROZAC) 20 MG tablet Take 1 tablet (20 mg total) by mouth daily. (Patient not taking: Reported on 06/18/2015)   No facility-administered encounter medications on file as of 06/18/2015.    Review of Systems  Constitutional: Negative for appetite change and unexpected weight change.  HENT: Negative for congestion and sinus pressure.   Respiratory: Negative for cough, chest tightness and shortness of breath.   Cardiovascular: Positive for leg swelling. Negative for chest pain and palpitations.  Gastrointestinal: Negative for nausea,  vomiting, abdominal pain and diarrhea.  Genitourinary: Negative for dysuria and difficulty urinating.  Musculoskeletal: Negative for back pain and joint swelling.  Skin:       Erythema and tenderness - right lower anterior shin.   Neurological: Negative for dizziness, light-headedness and headaches.  Psychiatric/Behavioral: Negative for dysphoric mood and agitation.       Increased stress as outlined.         Objective:    Physical Exam  Constitutional: She appears well-developed and well-nourished. No distress.  HENT:  Nose: Nose normal.  Mouth/Throat: Oropharynx is clear and moist.  Neck: Neck supple. No thyromegaly present.  Cardiovascular: Normal rate and regular rhythm.   Pulmonary/Chest:  Breath sounds normal. No respiratory distress. She has no wheezes.  Abdominal: Soft. Bowel sounds are normal. There is no tenderness.  Musculoskeletal: She exhibits no tenderness.  Minimal swelling - left lower leg.    Lymphadenopathy:    She has no cervical adenopathy.  Skin: There is erythema.  Tenderness over right lower anterior shin.    Psychiatric: She has a normal mood and affect. Her behavior is normal.    BP 102/68 mmHg  Pulse 69  Temp(Src) 97.9 F (36.6 C)  Wt 208 lb 3.2 oz (94.439 kg)  SpO2 97%  LMP 03/30/2015 Wt Readings from Last 3 Encounters:  06/18/15 208 lb 3.2 oz (94.439 kg)  05/07/15 210 lb (95.255 kg)  04/02/15 212 lb 6 oz (96.333 kg)     Lab Results  Component Value Date   WBC 6.6 09/26/2013   HGB 14.3 09/26/2013   HCT 42.6 09/26/2013   PLT 394.0 09/26/2013   GLUCOSE 90 04/09/2014   CHOL 185 04/09/2014   TRIG 93.0 04/09/2014   HDL 48.00 04/09/2014   LDLCALC 118* 04/09/2014   ALT 24 04/09/2014   AST 21 04/09/2014   NA 138 04/09/2014   K 3.8 04/09/2014   CL 106 04/09/2014   CREATININE 0.81 04/09/2014   BUN 17 04/09/2014   CO2 27 04/09/2014   TSH 1.07 09/26/2013    Mm Digital Screening Bilateral  01/14/2015  CLINICAL DATA:  Screening. EXAM: DIGITAL SCREENING BILATERAL MAMMOGRAM WITH CAD COMPARISON:  Previous exam(s). ACR Breast Density Category c: The breast tissue is heterogeneously dense, which may obscure small masses. FINDINGS: There are no findings suspicious for malignancy. Images were processed with CAD. IMPRESSION: No mammographic evidence of malignancy. A result letter of this screening mammogram will be mailed directly to the patient. RECOMMENDATION: Screening mammogram in one year. (Code:SM-B-01Y) BI-RADS CATEGORY  1: Negative. Electronically Signed   By: Lovey Newcomer M.D.   On: 01/14/2015 08:31       Assessment & Plan:   Problem List Items Addressed This Visit    Erythema of lower extremity    Increased erythema and minimal swelling  localized to left anterior shin.  Treat with keflex.  Elevate legs.  Follow closely.  Call with update tomorrow.        GERD (gastroesophageal reflux disease) - Primary    On nexium.  Controlled.       Menstrual irregularity    On ocp's.  No period since restarting.  Keep menstrual diary.  She has checked urine pregnancy tests.  Negative x 2.  Will check with next labs.        Stress    Off prozac.  School year ending.  Feels will do better once out of school.  Discussed with her today.  Hold on further medication.  Follow.  Relevant Orders   CBC with Differential/Platelet   Comprehensive metabolic panel   TSH   UTI (urinary tract infection)    Recent uti.  Treated.  Symptoms resolved.       Relevant Medications   cephALEXin (KEFLEX) 500 MG capsule    Other Visit Diagnoses    Screening cholesterol level        Relevant Orders    Lipid panel        Einar Pheasant, MD

## 2015-06-19 ENCOUNTER — Ambulatory Visit
Admission: RE | Admit: 2015-06-19 | Discharge: 2015-06-19 | Disposition: A | Discharge status: Home / Self Care | Payer: BC Managed Care – PPO | Source: Ambulatory Visit | Attending: Internal Medicine | Admitting: Internal Medicine

## 2015-06-19 DIAGNOSIS — M7989 Other specified soft tissue disorders: Secondary | ICD-10-CM | POA: Diagnosis not present

## 2015-06-19 MED ORDER — LEVOFLOXACIN 0.5 % OP SOLN: OPHTHALMIC | Status: DC

## 2015-06-19 NOTE — Telephone Encounter (Signed)
Order placed for lower extremity ultrasound.   

## 2015-06-19 NOTE — Addendum Note (Signed)
Addended by: Alisa Graff on: 06/19/2015 03:36 PM   Modules accepted: Orders

## 2015-06-19 NOTE — Telephone Encounter (Signed)
rx sent in for abx eye drops.

## 2015-06-19 NOTE — Addendum Note (Signed)
Addended by: Alisa Graff on: 06/19/2015 01:29 PM   Modules accepted: Orders

## 2015-06-20 ENCOUNTER — Encounter: Payer: Self-pay | Admitting: Internal Medicine

## 2015-06-20 DIAGNOSIS — L539 Erythematous condition, unspecified: Secondary | ICD-10-CM | POA: Insufficient documentation

## 2015-06-20 NOTE — Assessment & Plan Note (Signed)
Recent uti.  Treated.  Symptoms resolved.

## 2015-06-20 NOTE — Assessment & Plan Note (Signed)
On nexium.  Controlled.   

## 2015-06-20 NOTE — Assessment & Plan Note (Signed)
Off prozac.  School year ending.  Feels will do better once out of school.  Discussed with her today.  Hold on further medication.  Follow.

## 2015-06-20 NOTE — Assessment & Plan Note (Signed)
On ocp's.  No period since restarting.  Keep menstrual diary.  She has checked urine pregnancy tests.  Negative x 2.  Will check with next labs.

## 2015-06-20 NOTE — Telephone Encounter (Signed)
Spoke to pt on the phone.  Scheduled for lower extremity ultrasound.  Ultrasound negative for DVT.  abx sent in.  Will keep me posted.

## 2015-06-20 NOTE — Assessment & Plan Note (Signed)
Increased erythema and minimal swelling localized to left anterior shin.  Treat with keflex.  Elevate legs.  Follow closely.  Call with update tomorrow.

## 2015-06-21 MED ORDER — FLUCONAZOLE 150 MG PO TABS: ORAL_TABLET | ORAL | Status: DC

## 2015-06-21 NOTE — Addendum Note (Signed)
Addended by: Alisa Graff on: 06/21/2015 06:04 PM   Modules accepted: Orders

## 2015-06-21 NOTE — Telephone Encounter (Signed)
rx sent in for diflucan #2 with no refills to be taken as directed.

## 2015-06-23 ENCOUNTER — Other Ambulatory Visit (INDEPENDENT_AMBULATORY_CARE_PROVIDER_SITE_OTHER): Payer: BC Managed Care – PPO

## 2015-06-23 DIAGNOSIS — F439 Reaction to severe stress, unspecified: Secondary | ICD-10-CM

## 2015-06-23 DIAGNOSIS — Z13220 Encounter for screening for lipoid disorders: Secondary | ICD-10-CM

## 2015-06-23 DIAGNOSIS — Z658 Other specified problems related to psychosocial circumstances: Secondary | ICD-10-CM

## 2015-06-23 LAB — COMPREHENSIVE METABOLIC PANEL
ALK PHOS: 64 U/L (ref 39–117)
ALT: 13 U/L (ref 0–35)
AST: 15 U/L (ref 0–37)
Albumin: 3.8 g/dL (ref 3.5–5.2)
BILIRUBIN TOTAL: 0.4 mg/dL (ref 0.2–1.2)
BUN: 22 mg/dL (ref 6–23)
CO2: 25 mEq/L (ref 19–32)
CREATININE: 0.77 mg/dL (ref 0.40–1.20)
Calcium: 9.2 mg/dL (ref 8.4–10.5)
Chloride: 103 mEq/L (ref 96–112)
GFR: 87.76 mL/min (ref >60.00)
GLUCOSE: 102 mg/dL — AB (ref 70–99)
Potassium: 3.9 mEq/L (ref 3.5–5.1)
SODIUM: 137 meq/L (ref 135–145)
TOTAL PROTEIN: 7.2 g/dL (ref 6.0–8.3)

## 2015-06-23 LAB — CBC WITH DIFFERENTIAL/PLATELET
BASOS ABS: 0 10*3/uL (ref 0.0–0.1)
BASOS PCT: 0.4 % (ref 0.0–3.0)
EOS ABS: 0.3 10*3/uL (ref 0.0–0.7)
Eosinophils Relative: 3.5 % (ref 0.0–5.0)
HCT: 43.8 % (ref 36.0–46.0)
Hemoglobin: 14.9 g/dL (ref 12.0–15.0)
LYMPHS ABS: 3.2 10*3/uL (ref 0.7–4.0)
Lymphocytes Relative: 38.3 % (ref 12.0–46.0)
MCHC: 33.9 g/dL (ref 30.0–36.0)
MCV: 93.7 fl (ref 78.0–100.0)
Monocytes Absolute: 0.5 10*3/uL (ref 0.1–1.0)
Monocytes Relative: 6.1 % (ref 3.0–12.0)
NEUTROS ABS: 4.4 10*3/uL (ref 1.4–7.7)
NEUTROS PCT: 51.7 % (ref 43.0–77.0)
Platelets: 305 10*3/uL (ref 150.0–400.0)
RBC: 4.68 Mil/uL (ref 3.87–5.11)
RDW: 13 % (ref 11.5–15.5)
WBC: 8.5 10*3/uL (ref 4.0–10.5)

## 2015-06-23 LAB — LIPID PANEL
CHOL/HDL RATIO: 4
Cholesterol: 215 mg/dL — ABNORMAL HIGH (ref 0–200)
HDL: 48.8 mg/dL (ref >39.00)
LDL Cholesterol: 147 mg/dL — ABNORMAL HIGH (ref 0–99)
NONHDL: 166.07
Triglycerides: 94 mg/dL (ref 0.0–149.0)
VLDL: 18.8 mg/dL (ref 0.0–40.0)

## 2015-06-23 LAB — TSH: TSH: 2.68 u[IU]/mL (ref 0.35–4.50)

## 2015-06-24 ENCOUNTER — Encounter: Payer: Self-pay | Admitting: Internal Medicine

## 2015-07-16 ENCOUNTER — Ambulatory Visit
Admission: RE | Admit: 2015-07-16 | Discharge: 2015-07-16 | Disposition: A | Discharge status: Home / Self Care | Payer: BC Managed Care – PPO | Source: Ambulatory Visit | Attending: Family Medicine | Admitting: Family Medicine

## 2015-07-16 ENCOUNTER — Other Ambulatory Visit: Payer: Self-pay | Admitting: Family Medicine

## 2015-07-16 DIAGNOSIS — M25532 Pain in left wrist: Secondary | ICD-10-CM

## 2015-07-27 ENCOUNTER — Encounter: Payer: Self-pay | Admitting: Obstetrics and Gynecology

## 2015-08-05 ENCOUNTER — Encounter: Payer: Self-pay | Admitting: Internal Medicine

## 2015-08-18 ENCOUNTER — Encounter: Payer: Self-pay | Admitting: Obstetrics and Gynecology

## 2015-08-18 ENCOUNTER — Ambulatory Visit (INDEPENDENT_AMBULATORY_CARE_PROVIDER_SITE_OTHER): Payer: BC Managed Care – PPO | Admitting: Obstetrics and Gynecology

## 2015-08-18 VITALS — BP 115/80 | HR 76 | Ht 70.0 in | Wt 215.8 lb

## 2015-08-18 DIAGNOSIS — N938 Other specified abnormal uterine and vaginal bleeding: Secondary | ICD-10-CM

## 2015-08-18 NOTE — Progress Notes (Signed)
    GYNECOLOGY PROGRESS NOTE  Subjective:    Patient ID: Melissa Higgins, female    DOB: 01-Nov-1975, 40 y.o.   MRN: [id number]  HPI  Patient is a 40 y.o. G0P0000 female who presents for complaints of irregular menses. Patient states that she was on OCPs up until January in which she discontinued.  Notes that she resumed the OCPs in March.  Notes that she had a period in March, then none in April or May, then had another period in July.  Cycle in July was very heavy, and lasted x 3 weeks.  Denies any skipped pills during that time, and also reports taking a pregnancy test in July which was negative. Also reports staring Prozac in June, but only took x 1 month as she did not like the side effects.   The following portions of the patient's history were reviewed and updated as appropriate: allergies, current medications, past family history, past medical history, past social history, past surgical history and problem list.  Review of Systems Pertinent items noted in HPI and remainder of comprehensive ROS otherwise negative.   Objective:   Blood pressure 115/80, pulse 76, height 5\' 10"  (1.778 m), weight 215 lb 12.8 oz (97.9 kg), last menstrual period 07/31/2015. General appearance: alert and no distress Abdomen: soft, non-tender; bowel sounds normal; no masses,  no organomegaly Pelvic: cervix normal in appearance, external genitalia normal, no adnexal masses or tenderness, no cervical motion tenderness, rectovaginal septum normal, uterus normal size, shape, and consistency and vagina normal without discharge Extremities: extremities normal, atraumatic, no cyanosis or edema Neurologic: Grossly normal   Assessment:   Dysfunctional uterine bleeding  Plan:   Discussion had that abnormal bleeding could be caused by recent medication,structural lesion such as endometrial polyp/fibroid uterus (last pelvic ultrasound was last year, normal).  If cycle does not return to normal in August, will f/u  with new pelvic ultrasound and proceed with workup.  Continued to encourage good compliance with OCPs.    Rubie Maid, MD [facility]

## 2015-08-26 ENCOUNTER — Encounter: Payer: Self-pay | Admitting: Internal Medicine

## 2015-09-03 NOTE — Telephone Encounter (Signed)
Please place her on my schedule tomorrow at 12:15.  Thanks.

## 2015-09-04 ENCOUNTER — Encounter: Payer: Self-pay | Admitting: Internal Medicine

## 2015-09-04 ENCOUNTER — Ambulatory Visit (INDEPENDENT_AMBULATORY_CARE_PROVIDER_SITE_OTHER): Payer: BC Managed Care – PPO | Admitting: Internal Medicine

## 2015-09-04 DIAGNOSIS — J0110 Acute frontal sinusitis, unspecified: Secondary | ICD-10-CM | POA: Diagnosis not present

## 2015-09-04 MED ORDER — CEFUROXIME AXETIL 250 MG PO TABS: 250.0000 mg | ORAL_TABLET | Freq: Two times a day (BID) | ORAL | 0 refills | Status: DC

## 2015-09-04 NOTE — Progress Notes (Signed)
Patient ID: Melissa Higgins, female   DOB: 08/02/1975, 40 y.o.   MRN: [id number]   Subjective:    Patient ID: Melissa Higgins, female    DOB: 01/30/1975, 40 y.o.   MRN: [id number]  HPI  Patient here as a work in with concerns regarding persistent sinus symptoms.  Symptoms started a few weeks ago.  Intermittent.  Over the last 10 days, has worsened and has been persistent.  Increased pressure. Ears feel full.  Unable to blow any congestion out.  Some irritation in her throat after wakes up.  No fever.  No chest congestion.  No sob.  No fever.   Has tried nasal sprays and mucinex.  Took an antihistamine.     Past Medical History:  Diagnosis Date  . C. difficile colitis    augment use  . Depression   . Dysmenorrhea   . Environmental allergies    allergy shots  . History of frequent urinary tract infections   . Hypercholesterolemia    Past Surgical History:  Procedure Laterality Date  . NASAL SINUS SURGERY  2003   Family History  Problem Relation Age of Onset  . Hyperlipidemia Mother   . Asthma Father   . Hyperlipidemia Father   . Allergies Father    Social History   Social History  . Marital status: Single    Spouse name: N/A  . Number of children: 0  . Years of education: college   Social History Main Topics  . Smoking status: Never Smoker  . Smokeless tobacco: Never Used  . Alcohol use 0.0 oz/week     Comment: occasional  . Drug use: No  . Sexual activity: Yes    Birth control/ protection: Pill   Other Topics Concern  . 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    Outpatient Encounter Prescriptions as of 09/04/2015  Medication Sig  . EPINEPHrine 0.3 mg/0.3 mL IJ SOAJ injection   . esomeprazole (NEXIUM) 40 MG capsule Take 1 capsule (40 mg total) by mouth 2 (two) times daily before a meal.  . Loratadine 10 MG CAPS Take by mouth.  . Norethin Ace-Eth  Estrad-FE (TAYTULLA) 1-20 MG-MCG(24) CAPS Take 20 mg by mouth daily.  . SUDAFED 12 HOUR 120 MG 12 hr tablet TK 1 T PO  BID FOR SINONASAL PRESSURE  . valACYclovir (VALTREX) 1000 MG tablet Take 1 tablet (1,000 mg total) by mouth daily.  . cefUROXime (CEFTIN) 250 MG tablet Take 1 tablet (250 mg total) by mouth 2 (two) times daily with a meal.   No facility-administered encounter medications on file as of 09/04/2015.     Review of Systems  Constitutional: Negative for appetite change and unexpected weight change.  HENT: Positive for postnasal drip and sinus pressure.        Ear fullness.   Respiratory: Negative for cough, chest tightness and shortness of breath.   Cardiovascular: Negative for chest pain, palpitations and leg swelling.  Gastrointestinal: Negative for diarrhea, nausea and vomiting.  Neurological: Negative for dizziness and light-headedness.       Increased sinus pressure.   Psychiatric/Behavioral:       Stress is better.        Objective:    Physical Exam  Constitutional: She appears well-developed and well-nourished. No distress.  HENT:  Mouth/Throat: Oropharynx is clear and moist.  Nares - erythematous turbinates.  TMs without erythema.    Neck: Neck supple.  Cardiovascular: Normal rate and regular rhythm.   Pulmonary/Chest: Breath sounds normal. No respiratory distress. She has no wheezes.  Lymphadenopathy:    She has no cervical adenopathy.    BP 118/76   Pulse 78   Temp 98.1 F (36.7 C) (Oral)   Resp 12   Ht 5\' 10"  (1.778 m)   Wt 218 lb 8 oz (99.1 kg)   LMP 07/31/2015 (Approximate)   SpO2 98%   BMI 31.35 kg/m  Wt Readings from Last 3 Encounters:  09/04/15 218 lb 8 oz (99.1 kg)  08/18/15 215 lb 12.8 oz (97.9 kg)  06/18/15 208 lb 3.2 oz (94.4 kg)     Lab Results  Component Value Date   WBC 8.5 06/23/2015   HGB 14.9 06/23/2015   HCT 43.8 06/23/2015   PLT 305.0 06/23/2015   GLUCOSE 102 (H) 06/23/2015   CHOL 215 (H) 06/23/2015   TRIG 94.0  06/23/2015   HDL 48.80 06/23/2015   LDLCALC 147 (H) 06/23/2015   ALT 13 06/23/2015   AST 15 06/23/2015   NA 137 06/23/2015   K 3.9 06/23/2015   CL 103 06/23/2015   CREATININE 0.77 06/23/2015   BUN 22 06/23/2015   CO2 25 06/23/2015   TSH 2.68 06/23/2015    Dg Wrist Complete Left  Result Date: 07/16/2015 CLINICAL DATA:  Pt fell at the end of May on her left wrist and her wrist has been hurting since then. It hurts mainly on the ulna side. EXAM: LEFT WRIST - COMPLETE 3+ VIEW COMPARISON:  None. FINDINGS: There is no evidence of fracture or dislocation. There is no evidence of arthropathy or other focal bone abnormality. Soft tissues are unremarkable. IMPRESSION: Negative. Electronically Signed   By: Lajean Manes M.D.   On: 07/16/2015 16:20       Assessment & Plan:   Problem List Items Addressed This Visit    Sinusitis, acute    Persistent symptoms.  Has tried otc.  Worsened and persistent over the last three weeks.  Continue saline nasal spray and flonase as directed.  mucinex as directed.  Discussed concern regarding pregnancy.  Periods sporadic.  Using no protection.  Will treat with ceftin.  Hold on doxycycline.  Follow.  Notify me if persistent symptoms.  May need referral back to ENT.        Relevant Medications   cefUROXime (CEFTIN) 250 MG tablet    Other Visit Diagnoses   None.      Einar Pheasant, MD

## 2015-09-04 NOTE — Progress Notes (Signed)
Pre-visit discussion using our clinic review tool. No additional management support is needed unless otherwise documented below in the visit note.  

## 2015-09-06 ENCOUNTER — Encounter: Payer: Self-pay | Admitting: Internal Medicine

## 2015-09-06 NOTE — Assessment & Plan Note (Signed)
Persistent symptoms.  Has tried otc.  Worsened and persistent over the last three weeks.  Continue saline nasal spray and flonase as directed.  mucinex as directed.  Discussed concern regarding pregnancy.  Periods sporadic.  Using no protection.  Will treat with ceftin.  Hold on doxycycline.  Follow.  Notify me if persistent symptoms.  May need referral back to ENT.

## 2015-09-12 ENCOUNTER — Encounter: Payer: Self-pay | Admitting: Internal Medicine

## 2015-09-15 NOTE — Telephone Encounter (Signed)
If she is having this much pain, would recommend going ahead and being evaluated and then I can f/u after.

## 2015-09-17 ENCOUNTER — Ambulatory Visit (INDEPENDENT_AMBULATORY_CARE_PROVIDER_SITE_OTHER): Payer: BC Managed Care – PPO | Admitting: Family

## 2015-09-17 ENCOUNTER — Ambulatory Visit (INDEPENDENT_AMBULATORY_CARE_PROVIDER_SITE_OTHER): Payer: BC Managed Care – PPO

## 2015-09-17 ENCOUNTER — Encounter: Payer: Self-pay | Admitting: Family

## 2015-09-17 VITALS — BP 110/66 | HR 86 | Temp 98.1°F | Ht 70.0 in | Wt 220.2 lb

## 2015-09-17 DIAGNOSIS — M533 Sacrococcygeal disorders, not elsewhere classified: Secondary | ICD-10-CM

## 2015-09-17 NOTE — Progress Notes (Signed)
Subjective:    Patient ID: Melissa Higgins, female    DOB: [birth year], 40 y.o.   MRN: [id number]  CC: Melissa Higgins is a 40 y.o. female who presents today for an acute visit.    HPI: Patient presents for acute visit for tailbone pain which started several months ago, notably after sitting for basketball games in 12/2014. Describes pain as 'right on tail bone'. Waxing and waning.Believes becoming more constant. No injury. Has tried pillow with some relief. Hasn't tried NSAIDs, only essential oils. Pain present with sitting and also when standing. Husband works as Careers adviser and she is sitting on bleachers a lot now. No low back pain.  No fevers, chills, night sweats, unexplained weight loss, numbness, tingling LE.     HISTORY:  Past Medical History:  Diagnosis Date  . C. difficile colitis    augment use  . Depression   . Dysmenorrhea   . Environmental allergies    allergy shots  . History of frequent urinary tract infections   . Hypercholesterolemia    Past Surgical History:  Procedure Laterality Date  . NASAL SINUS SURGERY  2003   Family History  Problem Relation Age of Onset  . Hyperlipidemia Mother   . Asthma Father   . Hyperlipidemia Father   . Allergies Father     Allergies: Augmentin [amoxicillin-pot clavulanate]; Macrobid [nitrofurantoin macrocrystal]; and Nitrofurantoin Current Outpatient Prescriptions on File Prior to Visit  Medication Sig Dispense Refill  . EPINEPHrine 0.3 mg/0.3 mL IJ SOAJ injection     . esomeprazole (NEXIUM) 40 MG capsule Take 1 capsule (40 mg total) by mouth 2 (two) times daily before a meal. 60 capsule 2  . Loratadine 10 MG CAPS Take by mouth.    . Norethin Ace-Eth Estrad-FE (TAYTULLA) 1-20 MG-MCG(24) CAPS Take 20 mg by mouth daily. 24 capsule 11  . SUDAFED 12 HOUR 120 MG 12 hr tablet TK 1 T PO  BID FOR SINONASAL PRESSURE  3  . valACYclovir (VALTREX) 1000 MG tablet Take 1 tablet (1,000 mg total) by mouth daily. 30 tablet 11    No current facility-administered medications on file prior to visit.     Social History  Substance Use Topics  . Smoking status: Never Smoker  . Smokeless tobacco: Never Used  . Alcohol use 0.0 oz/week     Comment: occasional    Review of Systems  Constitutional: Negative for chills, fever and unexpected weight change.  Respiratory: Negative for cough.   Cardiovascular: Negative for chest pain and palpitations.  Gastrointestinal: Negative for nausea and vomiting.  Musculoskeletal: Negative for back pain.      Objective:    BP 110/66   Pulse 86   Temp 98.1 F (36.7 C) (Oral)   Ht 5\' 10"  (1.778 m)   Wt 220 lb 3.2 oz (99.9 kg)   LMP 07/31/2015 (Approximate)   SpO2 98%   BMI 31.60 kg/m    Physical Exam  Constitutional: She appears well-developed and well-nourished.  Eyes: Conjunctivae are normal.  Cardiovascular: Normal rate, regular rhythm, normal heart sounds and normal pulses.   Pulmonary/Chest: Effort normal and breath sounds normal. She has no wheezes. She has no rhonchi. She has no rales.  Neurological: She is alert.  Skin: Skin is warm and dry.  Psychiatric: She has a normal mood and affect. Her speech is normal and behavior is normal. Thought content normal.  Vitals reviewed.      Assessment & Plan:   1. Coccyx pain Suspect recurrent  trauma from sitting on bleachers continues to aggravate and delay healing. No systemic features. Pending imaging.   - DG Sacrum/Coccyx - Ambulatory referral to Orthopedic Surgery    I have discontinued Melissa Higgins's cefUROXime. I am also having her maintain her valACYclovir, esomeprazole, Norethin Ace-Eth Estrad-FE, Loratadine, SUDAFED 12 HOUR, and EPINEPHrine.   No orders of the defined types were placed in this encounter.   Return precautions given.   Risks, benefits, and alternatives of the medications and treatment plan prescribed today were discussed, and patient expressed understanding.   Education regarding  symptom management and diagnosis given to patient on AVS.  Continue to follow with Melissa Pheasant, MD for routine health maintenance.   Melissa Higgins and I agreed with plan.   Melissa Paris, FNP

## 2015-09-17 NOTE — Progress Notes (Signed)
Pre visit review using our clinic review tool, if applicable. No additional management support is needed unless otherwise documented below in the visit note. 

## 2015-09-17 NOTE — Patient Instructions (Signed)
Xrays.   Continue ice, heat, ibuprofen.   If there is no improvement in your symptoms, or if there is any worsening of symptoms, or if you have any additional concerns, please return for re-evaluation; or, if we are closed, consider going to the Emergency Room for evaluation if symptoms urgent.

## 2015-09-25 DIAGNOSIS — S300XXA Contusion of lower back and pelvis, initial encounter: Secondary | ICD-10-CM | POA: Insufficient documentation

## 2015-09-25 DIAGNOSIS — M533 Sacrococcygeal disorders, not elsewhere classified: Secondary | ICD-10-CM | POA: Insufficient documentation

## 2015-10-11 ENCOUNTER — Encounter: Payer: Self-pay | Admitting: Internal Medicine

## 2015-10-11 ENCOUNTER — Encounter: Payer: Self-pay | Admitting: Obstetrics and Gynecology

## 2015-10-12 ENCOUNTER — Telehealth: Payer: Self-pay | Admitting: Obstetrics and Gynecology

## 2015-10-12 NOTE — Telephone Encounter (Signed)
PT CALLED AND SHE STATED THAT SHE AND DR CHERRY WERE TALKING ABOUT DOING AN [place] AND SEEING HER AFTER, AND THEN SHE SAID SHE TALKED TO YOU ABOUT IT, I DONT HAVE A MESSAGE STATING THIS, AND I DIDN'T KNOW IF YOU KNEW ANYTHING ABOUT THIS, ACCORDING TO HER LAST APPT NOTE IN [facility] SHE WAS SUPPOSE TO RETURN IS SYMPTOMS WORSENED, LET ME KNOW, I AM LEAVING TODAY AT 12:00, SO IF THIS NEEDS TO BE SCHEDULE YOU CAN SEND TO [facility] OR [facility], I DID TELL THE PT WE WOULD CALL HER BACK TO SCHEDULE IF THAT IS WHAT DR CHERRY NEEDED.

## 2015-10-12 NOTE — Telephone Encounter (Signed)
Please call this pt and have her scheduled for a u/s for DUB and see Comfort after. Thanks

## 2015-10-13 NOTE — Telephone Encounter (Signed)
See my chart message.  She was asking what she could take for pain.  I am not sure what she has been taking.  Need to clarify.   With fall and increased pain, it sounds like she needs to be evaluated.  I have a meeting this pm and I am unable to work her in.  Not sure if anyone else has opening.  If so, please schedule appt today for evaluation.  If not, would recommend acute care and then I can f/u with her.  Thanks

## 2015-10-20 ENCOUNTER — Other Ambulatory Visit: Payer: Self-pay | Admitting: Obstetrics and Gynecology

## 2015-10-20 DIAGNOSIS — N938 Other specified abnormal uterine and vaginal bleeding: Secondary | ICD-10-CM

## 2015-10-21 ENCOUNTER — Ambulatory Visit (INDEPENDENT_AMBULATORY_CARE_PROVIDER_SITE_OTHER): Payer: BC Managed Care – PPO | Admitting: Obstetrics and Gynecology

## 2015-10-21 ENCOUNTER — Ambulatory Visit (INDEPENDENT_AMBULATORY_CARE_PROVIDER_SITE_OTHER): Payer: BC Managed Care – PPO

## 2015-10-21 ENCOUNTER — Encounter: Payer: Self-pay | Admitting: Obstetrics and Gynecology

## 2015-10-21 VITALS — BP 110/74 | HR 81 | Ht 70.0 in | Wt 220.9 lb

## 2015-10-21 DIAGNOSIS — N912 Amenorrhea, unspecified: Secondary | ICD-10-CM

## 2015-10-21 DIAGNOSIS — R42 Dizziness and giddiness: Secondary | ICD-10-CM | POA: Diagnosis not present

## 2015-10-21 DIAGNOSIS — N938 Other specified abnormal uterine and vaginal bleeding: Secondary | ICD-10-CM | POA: Diagnosis not present

## 2015-10-21 DIAGNOSIS — N898 Other specified noninflammatory disorders of vagina: Secondary | ICD-10-CM | POA: Diagnosis not present

## 2015-10-21 DIAGNOSIS — Z79899 Other long term (current) drug therapy: Secondary | ICD-10-CM

## 2015-10-21 DIAGNOSIS — Z793 Long term (current) use of hormonal contraceptives: Principal | ICD-10-CM

## 2015-10-21 NOTE — Progress Notes (Signed)
    GYNECOLOGY PROGRESS NOTE  Subjective:    Patient ID: Melissa Higgins, female    DOB: 10-26-1975, 40 y.o.   MRN: [id number]  HPI  Patient is a 40 y.o. G0P0000 female with h/o fibroid uterus who presents for complaints of amenorrhea since July.  Notes that she Has had some occasional spotting. Patient is currently taking OCPs.  Patient had a recent ultrasound which she also presents to discuss the results (originally performed for abnormal uterine bleeding).  Patient also states that she is still noticing a vaginal odor (however this odor is not observed by anyone else). Notes intermittent episodes of increased vaginal discharge however, denies any itching or burning.  In addition patient notes that she has been dizzy for several days, is worse when lying down. Denies persistent headache, ringing in the ears, or issues with blood pressure.  The following portions of the patient's history were reviewed and updated as appropriate: allergies, current medications, past family history, past medical history, past social history, past surgical history and problem list.  Review of Systems Pertinent items noted in HPI and remainder of comprehensive ROS otherwise negative.   Objective:   Blood pressure 110/74, pulse 81, height 5\' 10"  (1.778 m), weight 220 lb 14.4 oz (100.2 kg). General appearance: alert and no distress  Ears: Ears: normal TM's and external ear canals both ears Abdomen: soft, non-tender; bowel sounds normal; no masses,  no organomegaly Pelvic: cervix normal in appearance, external genitalia normal, no adnexal masses or tenderness, no cervical motion tenderness, rectovaginal septum normal, uterus normal size, shape, and consistency and vagina normal without discharge Extremities: extremities normal, atraumatic, no cyanosis or edema Neurologic: Grossly normal   Assessment:   Amenorrhea on OCPs Vaginal odor Dizziness (possibly vertigo)  Plan:   - Discussed with patient that  amenorrhea may occur while taking oral contraceptive pills. UPT performed in the office is negative. Patient notes that she is planning to get married next month, and would still like to begin attempts at conception. Advised patient to complete her next pill pack, and then can discontinue at that time to attempt conception.  Advised that based on patient's age, she may require assistance with fertility. Patient notes understanding.  - Vaginal odor, patient has had complaints for several months. Patient does have a history of recurrent vaginitis in the past. No odor noted on the scleral exam today, scant vaginal discharge in the vault, appears physiologic.  Discussed with patient again that there may be other causes of vaginal odor not related to infection. Discussed changing dietary habits, as patient reports that she does ingest a lot of garlic. Also continue to encourage use of probiotics and yogurt. -  Dizziness, possibly vertigo based on patient's complaints.  Ear exam normal today. Advised patient to follow up with PCP for further evaluation.  To follow-up as needed.  Rubie Maid, MD [facility]

## 2015-11-04 ENCOUNTER — Encounter: Payer: Self-pay | Admitting: Internal Medicine

## 2015-11-05 NOTE — Telephone Encounter (Signed)
I can work her in tomorrow at [employer].   Please call and see if she can come in then.  Thanks.

## 2015-11-06 ENCOUNTER — Ambulatory Visit (INDEPENDENT_AMBULATORY_CARE_PROVIDER_SITE_OTHER): Payer: BC Managed Care – PPO | Admitting: Internal Medicine

## 2015-11-06 ENCOUNTER — Encounter: Payer: Self-pay | Admitting: Internal Medicine

## 2015-11-06 DIAGNOSIS — J329 Chronic sinusitis, unspecified: Secondary | ICD-10-CM | POA: Diagnosis not present

## 2015-11-06 MED ORDER — CEFUROXIME AXETIL 250 MG PO TABS: 250.0000 mg | ORAL_TABLET | Freq: Two times a day (BID) | ORAL | 0 refills | Status: DC

## 2015-11-06 NOTE — Patient Instructions (Signed)
Saline nasal spray - flush nose at least 2-3x/day  nasacort nasal spray - 2 sprays each nostril one time per day.  Do this in the evening.    mucinex in the am and robitussin in the evening.    Take the probiotic daily while you are on the antibiotic and for two weeks after completing the antibiotic.

## 2015-11-06 NOTE — Progress Notes (Signed)
Patient ID: Melissa Higgins, female   DOB: [birth year], 40 y.o.   MRN: [id number]   Subjective:    Patient ID: Melissa Higgins, female    DOB: 1975/12/25, 40 y.o.   MRN: [id number]  HPI  Patient here as a work in with concerns regarding possible sinus infection.  She reports that symptoms started one week ago.  Reports increased sinus congestion and drainage.  Increased sinus pressure.  Increased nasal congestion.  Some drainage.  No fever.  Has been using a humidifier.  Using saline rinse.  No nausea or vomiting.  No diarrhea.     Past Medical History:  Diagnosis Date  . C. difficile colitis    augment use  . Depression   . Dysmenorrhea   . Environmental allergies    allergy shots  . History of frequent urinary tract infections   . Hypercholesterolemia    Past Surgical History:  Procedure Laterality Date  . NASAL SINUS SURGERY  2003   Family History  Problem Relation Age of Onset  . Hyperlipidemia Mother   . Asthma Father   . Hyperlipidemia Father   . Allergies Father    Social History   Social History  . Marital status: Single    Spouse name: N/A  . Number of children: 0  . Years of education: college   Social History Main Topics  . Smoking status: Never Smoker  . Smokeless tobacco: Never Used  . Alcohol use 0.0 oz/week     Comment: occasional  . Drug use: No  . Sexual activity: Yes    Birth control/ protection: None   Other Topics Concern  . 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    Outpatient Encounter Prescriptions as of 11/06/2015  Medication Sig  . EPINEPHrine 0.3 mg/0.3 mL IJ SOAJ injection   . esomeprazole (NEXIUM) 40 MG capsule Take 1 capsule (40 mg total) by mouth 2 (two) times daily before a meal.  . Loratadine 10 MG CAPS Take by mouth.  . Norethin Ace-Eth Estrad-FE (TAYTULLA) 1-20 MG-MCG(24) CAPS Take 20 mg by mouth daily.  . SUDAFED 12  HOUR 120 MG 12 hr tablet TK 1 T PO  BID FOR SINONASAL PRESSURE  . valACYclovir (VALTREX) 1000 MG tablet Take 1 tablet (1,000 mg total) by mouth daily.  . cefUROXime (CEFTIN) 250 MG tablet Take 1 tablet (250 mg total) by mouth 2 (two) times daily with a meal.   No facility-administered encounter medications on file as of 11/06/2015.     Review of Systems  Constitutional: Negative for appetite change and fever.  HENT: Positive for congestion, postnasal drip and sinus pressure.   Respiratory: Negative for cough, chest tightness and shortness of breath.   Cardiovascular: Negative for chest pain.  Gastrointestinal: Negative for diarrhea, nausea and vomiting.  Skin: Negative for color change and rash.  Neurological: Negative for dizziness and light-headedness.       Objective:    Physical Exam  Constitutional: She appears well-developed and well-nourished. No distress.  HENT:  Mouth/Throat: Oropharynx is clear and moist.  Nares - slightly erythematous turbinates.  TMs without erythema.  Minimal tenderness to palpation over the sinuses.    Neck: Neck supple.  Cardiovascular: Normal rate and regular rhythm.   Pulmonary/Chest: Breath sounds normal. No respiratory distress. She has no wheezes.  Lymphadenopathy:    She has no cervical adenopathy.  BP 130/90   Pulse 75   Temp 97.9 F (36.6 C) (Oral)   Wt 230 lb (104.3 kg)   LMP 07/11/2015 (Approximate)   SpO2 96%   BMI 33.00 kg/m  Wt Readings from Last 3 Encounters:  11/06/15 230 lb (104.3 kg)  10/21/15 220 lb 14.4 oz (100.2 kg)  09/17/15 220 lb 3.2 oz (99.9 kg)     Lab Results  Component Value Date   WBC 8.5 06/23/2015   HGB 14.9 06/23/2015   HCT 43.8 06/23/2015   PLT 305.0 06/23/2015   GLUCOSE 102 (H) 06/23/2015   CHOL 215 (H) 06/23/2015   TRIG 94.0 06/23/2015   HDL 48.80 06/23/2015   LDLCALC 147 (H) 06/23/2015   ALT 13 06/23/2015   AST 15 06/23/2015   NA 137 06/23/2015   K 3.9 06/23/2015   CL 103 06/23/2015    CREATININE 0.77 06/23/2015   BUN 22 06/23/2015   CO2 25 06/23/2015   TSH 2.68 06/23/2015    Dg Wrist Complete Left  Result Date: 07/16/2015 CLINICAL DATA:  Pt fell at the end of May on her left wrist and her wrist has been hurting since then. It hurts mainly on the ulna side. EXAM: LEFT WRIST - COMPLETE 3+ VIEW COMPARISON:  None. FINDINGS: There is no evidence of fracture or dislocation. There is no evidence of arthropathy or other focal bone abnormality. Soft tissues are unremarkable. IMPRESSION: Negative. Electronically Signed   By: Lajean Manes M.D.   On: 07/16/2015 16:20       Assessment & Plan:   Problem List Items Addressed This Visit    Sinusitis    Persistent increased sinus congestion and pressure.  Saline nasal spray and nasacort as outlined.  mucinex in the am and robitussin in the pm.  ceftin as directed.  Follow.  Notify me if symptoms persist.        Relevant Medications   cefUROXime (CEFTIN) 250 MG tablet    Other Visit Diagnoses   None.      Einar Pheasant, MD

## 2015-11-06 NOTE — Progress Notes (Signed)
Pre visit review using our clinic review tool, if applicable. No additional management support is needed unless otherwise documented below in the visit note. 

## 2015-11-07 ENCOUNTER — Encounter: Payer: Self-pay | Admitting: Internal Medicine

## 2015-11-07 NOTE — Assessment & Plan Note (Signed)
Persistent increased sinus congestion and pressure.  Saline nasal spray and nasacort as outlined.  mucinex in the am and robitussin in the pm.  ceftin as directed.  Follow.  Notify me if symptoms persist.

## 2015-11-09 ENCOUNTER — Encounter: Payer: Self-pay | Admitting: Internal Medicine

## 2015-11-09 MED ORDER — FLUCONAZOLE 150 MG PO TABS: 150.0000 mg | ORAL_TABLET | Freq: Once | ORAL | 0 refills | Status: AC

## 2015-11-09 NOTE — Telephone Encounter (Signed)
rx sent in for diflucan (two tablets).

## 2015-11-10 ENCOUNTER — Other Ambulatory Visit: Payer: Self-pay | Admitting: Internal Medicine

## 2015-12-23 ENCOUNTER — Encounter: Payer: Self-pay | Admitting: Internal Medicine

## 2015-12-25 ENCOUNTER — Ambulatory Visit (INDEPENDENT_AMBULATORY_CARE_PROVIDER_SITE_OTHER): Payer: BC Managed Care – PPO | Admitting: Family Medicine

## 2015-12-25 ENCOUNTER — Encounter: Payer: Self-pay | Admitting: Family Medicine

## 2015-12-25 DIAGNOSIS — J069 Acute upper respiratory infection, unspecified: Secondary | ICD-10-CM | POA: Diagnosis not present

## 2015-12-25 MED ORDER — DOXYCYCLINE HYCLATE 100 MG PO TABS: 100.0000 mg | ORAL_TABLET | Freq: Two times a day (BID) | ORAL | 0 refills | Status: DC

## 2015-12-25 MED ORDER — FLUCONAZOLE 150 MG PO TABS: 150.0000 mg | ORAL_TABLET | ORAL | 0 refills | Status: DC

## 2015-12-25 NOTE — Assessment & Plan Note (Signed)
Patient with 6 days of symptoms. Borderline for bacterial duration. Discussed using Flonase and Claritin to see if that would be beneficial for the next 1-2 days and if not improving she will fill the doxycycline that was provided. Discussed not getting pregnant while on the doxycycline. She recently had her menstrual cycle. She was provided with Diflucan to take in case she gets a yeast infection with the antibiotic. Advised to take a probiotic or eat some yogurt with this. Given return precautions.

## 2015-12-25 NOTE — Progress Notes (Signed)
Pre visit review using our clinic review tool, if applicable. No additional management support is needed unless otherwise documented below in the visit note. 

## 2015-12-25 NOTE — Progress Notes (Signed)
  Tommi Rumps, MD Phone: [phone]  Melissa Higgins is a 40 y.o. female who presents today for same-day visit.  Patient notes symptoms started 6 days ago. Sore throat and cough. Nasal and sinus congestion. Blowing green/yellow mucus out of her nose. No fevers. No shortness of breath. No known sick contacts. Some postnasal drip. Some ear fullness. Mucinex was helpful initially though not over the last day or so.  ROS see history of present illness  Objective  Physical Exam Vitals:   12/25/15 0822  BP: 134/82  Pulse: 83  Temp: 98 F (36.7 C)    BP Readings from Last 3 Encounters:  12/25/15 134/82  11/06/15 130/90  10/21/15 110/74   Wt Readings from Last 3 Encounters:  12/25/15 236 lb 12.8 oz (107.4 kg)  11/06/15 230 lb (104.3 kg)  10/21/15 220 lb 14.4 oz (100.2 kg)    Physical Exam  Constitutional: No distress.  HENT:  Head: Normocephalic and atraumatic.  Mouth/Throat: Oropharynx is clear and moist. No oropharyngeal exudate.  Eyes: Conjunctivae are normal. Pupils are equal, round, and reactive to light.  Neck: Neck supple.  Cardiovascular: Normal rate, regular rhythm and normal heart sounds.   Pulmonary/Chest: Effort normal and breath sounds normal.  Lymphadenopathy:    She has no cervical adenopathy.  Skin: She is not diaphoretic.     Assessment/Plan: Please see individual problem list.  Acute upper respiratory infection Patient with 6 days of symptoms. Borderline for bacterial duration. Discussed using Flonase and Claritin to see if that would be beneficial for the next 1-2 days and if not improving she will fill the doxycycline that was provided. Discussed not getting pregnant while on the doxycycline. She recently had her menstrual cycle. She was provided with Diflucan to take in case she gets a yeast infection with the antibiotic. Advised to take a probiotic or eat some yogurt with this. Given return precautions.   No orders of the defined types were  placed in this encounter.   Meds ordered this encounter  Medications  . doxycycline (VIBRA-TABS) 100 MG tablet    Sig: Take 1 tablet (100 mg total) by mouth 2 (two) times daily.    Dispense:  14 tablet    Refill:  0  . fluconazole (DIFLUCAN) 150 MG tablet    Sig: Take 1 tablet (150 mg total) by mouth every 3 (three) days.    Dispense:  2 tablet    Refill:  0    Tommi Rumps, MD [place]

## 2015-12-25 NOTE — Patient Instructions (Signed)
Nice to see you. You likely have an upper respiratory infection which could be viral or bacterial. I would use claritin and flonase for the next 1-2 days and if not improving, start on the doxycycline. If you take the doxycycline please take a probiotic or yogurt. If you develop shortness of breath, cough productive of blood, fevers, or any new change in symptoms please seek medical attention prior to filling the antibiotic.

## 2016-01-18 ENCOUNTER — Ambulatory Visit (INDEPENDENT_AMBULATORY_CARE_PROVIDER_SITE_OTHER): Payer: BC Managed Care – PPO | Admitting: Obstetrics and Gynecology

## 2016-01-18 VITALS — BP 122/70 | HR 89 | Ht 70.0 in | Wt 239.3 lb

## 2016-01-18 DIAGNOSIS — N390 Urinary tract infection, site not specified: Secondary | ICD-10-CM

## 2016-01-18 DIAGNOSIS — R319 Hematuria, unspecified: Secondary | ICD-10-CM

## 2016-01-18 LAB — POCT URINALYSIS DIPSTICK
Bilirubin, UA: NEGATIVE
Glucose, UA: NEGATIVE
KETONES UA: NEGATIVE
Nitrite, UA: NEGATIVE
PH UA: 5
UROBILINOGEN UA: NEGATIVE

## 2016-01-18 MED ORDER — CIPROFLOXACIN HCL 500 MG PO TABS: 500.0000 mg | ORAL_TABLET | Freq: Two times a day (BID) | ORAL | 0 refills | Status: DC

## 2016-01-18 NOTE — Progress Notes (Signed)
SUBJECTIVE: Melissa Higgins is a 41 y.o. female who complains of urinary frequency, urgency and dysuria x 4 days, without flank pain, fever, chills, or abnormal vaginal discharge or bleeding.   OBJECTIVE: Appears well, in no apparent distress.  Vital signs are normal. Urine dipstick shows Large Blood and moderate leukocytes.  ASSESSMENT: UTI uncomplicated.  PLAN: Treatment per orders - also push fluids, may use Urogesic Blue prn. Prescribed Cipro 500 mg, BID x 3 days. Urine Culture sent. Call or return to clinic prn if these symptoms worsen or fail to improve as anticipated.

## 2016-01-18 NOTE — Patient Instructions (Signed)
Urinary Tract Infection, Adult Introduction A urinary tract infection (UTI) is an infection of any part of the urinary tract. The urinary tract includes the:  Kidneys.  Ureters.  Bladder.  Urethra. These organs make, store, and get rid of pee (urine) in the body. Follow these instructions at home:  Take over-the-counter and prescription medicines only as told by your doctor.  If you were prescribed an antibiotic medicine, take it as told by your doctor. Do not stop taking the antibiotic even if you start to feel better.  Avoid the following drinks:  Alcohol.  Caffeine.  Tea.  Carbonated drinks.  Drink enough fluid to keep your pee clear or pale yellow.  Keep all follow-up visits as told by your doctor. This is important.  Make sure to:  Empty your bladder often and completely. Do not to hold pee for long periods of time.  Empty your bladder before and after sex.  Wipe from front to back after a bowel movement if you are female. Use each tissue one time when you wipe. Contact a doctor if:  You have back pain.  You have a fever.  You feel sick to your stomach (nauseous).  You throw up (vomit).  Your symptoms do not get better after 3 days.  Your symptoms go away and then come back. Get help right away if:  You have very bad back pain.  You have very bad lower belly (abdominal) pain.  You are throwing up and cannot keep down any medicines or water. This information is not intended to replace advice given to you by your health care provider. Make sure you discuss any questions you have with your health care provider. Document Released: 06/15/2007 Document Revised: 06/04/2015 Document Reviewed: 11/17/2014  2017 Elsevier  

## 2016-01-21 LAB — URINE CULTURE

## 2016-01-22 ENCOUNTER — Telehealth: Payer: Self-pay | Admitting: Obstetrics and Gynecology

## 2016-01-22 ENCOUNTER — Telehealth: Payer: Self-pay

## 2016-01-22 DIAGNOSIS — B373 Candidiasis of vulva and vagina: Secondary | ICD-10-CM

## 2016-01-22 DIAGNOSIS — B3731 Acute candidiasis of vulva and vagina: Secondary | ICD-10-CM

## 2016-01-22 NOTE — Telephone Encounter (Signed)
-----   Message from Rubie Maid, MD sent at 01/22/2016 11:05 AM EST ----- Patient with bacteriuria (Proteus).  Colonies not quite high enough to be UTI. Was treated empirically with Cipro.  Would recommend in the future that patient take prophylactic antibiotic with intercourse, and ensuring to void before and after intercourse.  Can prescribed Macrobid 100 mg tablets, #30, refills 2.  She should just take one tablet prior to intercourse.

## 2016-01-22 NOTE — Telephone Encounter (Signed)
Patient called requesting a refill on diflucan. Thanks

## 2016-01-22 NOTE — Telephone Encounter (Signed)
Called pt informed her of information below. RX NOT sent in as it is listed as an allergy in the pt's chart, will ask Dr.cherry for a drug substitution.

## 2016-01-25 ENCOUNTER — Other Ambulatory Visit: Payer: Self-pay

## 2016-01-25 DIAGNOSIS — Z87440 Personal history of urinary (tract) infections: Secondary | ICD-10-CM

## 2016-01-25 MED ORDER — CIPROFLOXACIN HCL 250 MG PO TABS: 250.0000 mg | ORAL_TABLET | ORAL | 3 refills | Status: DC | PRN

## 2016-01-25 MED ORDER — FLUCONAZOLE 150 MG PO TABS: 150.0000 mg | ORAL_TABLET | Freq: Once | ORAL | 5 refills | Status: AC

## 2016-01-25 NOTE — Telephone Encounter (Signed)
Spoke with Dr.cherry she states that pt should take cipro instead of Macrobid. RX sent in.

## 2016-01-29 ENCOUNTER — Other Ambulatory Visit: Payer: Self-pay | Admitting: Internal Medicine

## 2016-01-29 DIAGNOSIS — Z1231 Encounter for screening mammogram for malignant neoplasm of breast: Secondary | ICD-10-CM

## 2016-02-23 ENCOUNTER — Ambulatory Visit
Admission: RE | Admit: 2016-02-23 | Discharge: 2016-02-23 | Disposition: A | Discharge status: Home / Self Care | Payer: BC Managed Care – PPO | Source: Ambulatory Visit | Attending: Internal Medicine | Admitting: Internal Medicine

## 2016-02-23 DIAGNOSIS — Z1231 Encounter for screening mammogram for malignant neoplasm of breast: Secondary | ICD-10-CM

## 2016-03-07 ENCOUNTER — Other Ambulatory Visit: Payer: Self-pay | Admitting: Internal Medicine

## 2016-03-07 DIAGNOSIS — Z1231 Encounter for screening mammogram for malignant neoplasm of breast: Secondary | ICD-10-CM

## 2016-03-09 ENCOUNTER — Ambulatory Visit
Admission: RE | Admit: 2016-03-09 | Discharge: 2016-03-09 | Disposition: A | Discharge status: Home / Self Care | Payer: BC Managed Care – PPO | Source: Ambulatory Visit | Attending: Internal Medicine | Admitting: Internal Medicine

## 2016-03-09 DIAGNOSIS — Z1231 Encounter for screening mammogram for malignant neoplasm of breast: Secondary | ICD-10-CM | POA: Diagnosis present

## 2016-03-24 ENCOUNTER — Encounter: Payer: Self-pay | Admitting: Internal Medicine

## 2016-03-25 NOTE — Telephone Encounter (Signed)
Yes, needs an appt - 9:30 Monday 03/28/16.

## 2016-03-25 NOTE — Telephone Encounter (Signed)
Attempted to reach patient, left a detailed message of appointment on Monday, sent a mychart also. thanks

## 2016-03-28 ENCOUNTER — Ambulatory Visit (INDEPENDENT_AMBULATORY_CARE_PROVIDER_SITE_OTHER): Payer: BC Managed Care – PPO | Admitting: Internal Medicine

## 2016-03-28 ENCOUNTER — Encounter: Payer: Self-pay | Admitting: Internal Medicine

## 2016-03-28 VITALS — BP 110/74 | HR 74 | Temp 98.8°F | Resp 16 | Wt 239.2 lb

## 2016-03-28 DIAGNOSIS — F439 Reaction to severe stress, unspecified: Secondary | ICD-10-CM

## 2016-03-28 DIAGNOSIS — R5383 Other fatigue: Secondary | ICD-10-CM | POA: Diagnosis not present

## 2016-03-28 DIAGNOSIS — M2550 Pain in unspecified joint: Secondary | ICD-10-CM

## 2016-03-28 DIAGNOSIS — M79672 Pain in left foot: Secondary | ICD-10-CM | POA: Diagnosis not present

## 2016-03-28 DIAGNOSIS — G4700 Insomnia, unspecified: Secondary | ICD-10-CM | POA: Diagnosis not present

## 2016-03-28 DIAGNOSIS — K219 Gastro-esophageal reflux disease without esophagitis: Secondary | ICD-10-CM

## 2016-03-28 DIAGNOSIS — E7800 Pure hypercholesterolemia, unspecified: Secondary | ICD-10-CM | POA: Diagnosis not present

## 2016-03-28 LAB — LIPID PANEL
CHOLESTEROL: 201 mg/dL — AB (ref 0–200)
HDL: 49.8 mg/dL (ref >39.00)
LDL Cholesterol: 137 mg/dL — ABNORMAL HIGH (ref 0–99)
NONHDL: 151.69
Total CHOL/HDL Ratio: 4
Triglycerides: 74 mg/dL (ref 0.0–149.0)
VLDL: 14.8 mg/dL (ref 0.0–40.0)

## 2016-03-28 LAB — CBC WITH DIFFERENTIAL/PLATELET
BASOS PCT: 1.2 % (ref 0.0–3.0)
Basophils Absolute: 0.1 10*3/uL (ref 0.0–0.1)
EOS ABS: 0.2 10*3/uL (ref 0.0–0.7)
EOS PCT: 1.8 % (ref 0.0–5.0)
HCT: 42.7 % (ref 36.0–46.0)
Hemoglobin: 13.9 g/dL (ref 12.0–15.0)
LYMPHS ABS: 3.2 10*3/uL (ref 0.7–4.0)
Lymphocytes Relative: 38.3 % (ref 12.0–46.0)
MCHC: 32.6 g/dL (ref 30.0–36.0)
MCV: 93.1 fl (ref 78.0–100.0)
MONO ABS: 0.5 10*3/uL (ref 0.1–1.0)
Monocytes Relative: 5.8 % (ref 3.0–12.0)
NEUTROS PCT: 52.9 % (ref 43.0–77.0)
Neutro Abs: 4.4 10*3/uL (ref 1.4–7.7)
Platelets: 285 10*3/uL (ref 150.0–400.0)
RBC: 4.59 Mil/uL (ref 3.87–5.11)
RDW: 13.6 % (ref 11.5–15.5)
WBC: 8.3 10*3/uL (ref 4.0–10.5)

## 2016-03-28 LAB — COMPREHENSIVE METABOLIC PANEL
ALT: 14 U/L (ref 0–35)
AST: 18 U/L (ref 0–37)
Albumin: 3.9 g/dL (ref 3.5–5.2)
Alkaline Phosphatase: 93 U/L (ref 39–117)
BUN: 20 mg/dL (ref 6–23)
CO2: 24 mEq/L (ref 19–32)
Calcium: 9.2 mg/dL (ref 8.4–10.5)
Chloride: 105 mEq/L (ref 96–112)
Creatinine, Ser: 0.72 mg/dL (ref 0.40–1.20)
GFR: 94.48 mL/min (ref >60.00)
GLUCOSE: 90 mg/dL (ref 70–99)
POTASSIUM: 3.7 meq/L (ref 3.5–5.1)
SODIUM: 138 meq/L (ref 135–145)
TOTAL PROTEIN: 7.1 g/dL (ref 6.0–8.3)
Total Bilirubin: 0.6 mg/dL (ref 0.2–1.2)

## 2016-03-28 LAB — SEDIMENTATION RATE: Sed Rate: 22 mm/hr — ABNORMAL HIGH (ref 0–20)

## 2016-03-28 LAB — TSH: TSH: 2.69 u[IU]/mL (ref 0.35–4.50)

## 2016-03-28 NOTE — Progress Notes (Signed)
Pre-visit discussion using our clinic review tool. No additional management support is needed unless otherwise documented below in the visit note.  

## 2016-03-28 NOTE — Progress Notes (Signed)
Patient ID: Melissa Higgins, female   DOB: [birth year], 41 y.o.   MRN: [id number]   Subjective:    Patient ID: Melissa Higgins, female    DOB: Jun 19, 1975, 41 y.o.   MRN: [id number]  HPI  Patient here as a work in with concerns regarding increased fatigue.  Also having increased left foot pain.  Pain noticed left heel and bottom of foot.  Also pain left lateral foot.  Melissa Higgins has tried changing shoes and wearing support shoes.  Has helped some, but still with pain.  Discussed stretches and supports.  Given persistent pain, discussed referral to podiatry.  Also states Melissa Higgins wakes up tired.  Does not feel rested.  Sleeps the "right amount of hours", but still wakes up fatigued.  Also reports increased stiffness.  Multiple joints.  Movement does help some.  Concern regarding weight gain.  No chest pain.  Breathing stable.  No nausea or vomiting.  Bowels moving.     Past Medical History:  Diagnosis Date  . C. difficile colitis    augment use  . Depression   . Dysmenorrhea   . Environmental allergies    allergy shots  . History of frequent urinary tract infections   . Hypercholesterolemia    Past Surgical History:  Procedure Laterality Date  . NASAL SINUS SURGERY  2003   Family History  Problem Relation Age of Onset  . Hyperlipidemia Mother   . Asthma Father   . Hyperlipidemia Father   . Allergies Father   . Breast cancer Neg Hx    Social History   Social History  . Marital status: Single    Spouse name: N/A  . Number of children: 0  . Years of education: college   Social History Main Topics  . Smoking status: Never Smoker  . Smokeless tobacco: Never Used  . Alcohol use 0.0 oz/week     Comment: occasional  . Drug use: No  . Sexual activity: Yes    Birth control/ protection: None   Other Topics Concern  . 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    Outpatient Encounter  Prescriptions as of 03/28/2016  Medication Sig  . ciprofloxacin (CIPRO) 250 MG tablet Take 1 tablet (250 mg total) by mouth as needed.  Marland Kitchen EPINEPHrine 0.3 mg/0.3 mL IJ SOAJ injection   . esomeprazole (NEXIUM) 40 MG capsule TAKE 1 CAPSULE(40 MG) BY MOUTH TWICE DAILY BEFORE A MEAL  . Loratadine 10 MG CAPS Take by mouth.  . valACYclovir (VALTREX) 500 MG tablet TK 2 TS PO QD   No facility-administered encounter medications on file as of 03/28/2016.     Review of Systems  Constitutional: Positive for fatigue. Negative for appetite change.  HENT: Negative for congestion and sinus pressure.   Respiratory: Negative for cough, chest tightness and shortness of breath.   Cardiovascular: Negative for chest pain, palpitations and leg swelling.  Gastrointestinal: Negative for abdominal pain, diarrhea, nausea and vomiting.  Genitourinary: Negative for difficulty urinating and dysuria.  Musculoskeletal:       Joint stiffness as outlined.  Heel pain as outlined.    Skin: Negative for color change and rash.  Neurological: Negative for dizziness, light-headedness and headaches.  Psychiatric/Behavioral: Negative for agitation and dysphoric mood.       Objective:    Physical Exam  Constitutional: Melissa Higgins appears well-developed and well-nourished. No distress.  HENT:  Nose: Nose normal.  Mouth/Throat: Oropharynx is clear and moist.  Neck: Neck supple. No thyromegaly present.  Cardiovascular: Normal rate and regular rhythm.   Pulmonary/Chest: Breath sounds normal. No respiratory distress. Melissa Higgins has no wheezes.  Abdominal: Soft. Bowel sounds are normal. There is no tenderness.  Musculoskeletal: Melissa Higgins exhibits no edema or tenderness.  Lymphadenopathy:    Melissa Higgins has no cervical adenopathy.  Skin: No rash noted. No erythema.  Psychiatric: Melissa Higgins has a normal mood and affect. Her behavior is normal.    BP 110/74 (BP Location: Left Arm, Patient Position: Sitting, Cuff Size: Large)   Pulse 74   Temp 98.8 F (37.1 C)  (Oral)   Resp 16   Wt 239 lb 3.2 oz (108.5 kg)   LMP 03/04/2016   SpO2 97%   BMI 34.32 kg/m  Wt Readings from Last 3 Encounters:  03/28/16 239 lb 3.2 oz (108.5 kg)  01/18/16 239 lb 4.8 oz (108.5 kg)  12/25/15 236 lb 12.8 oz (107.4 kg)     Lab Results  Component Value Date   WBC 8.3 03/28/2016   HGB 13.9 03/28/2016   HCT 42.7 03/28/2016   PLT 285.0 03/28/2016   GLUCOSE 90 03/28/2016   CHOL 201 (H) 03/28/2016   TRIG 74.0 03/28/2016   HDL 49.80 03/28/2016   LDLCALC 137 (H) 03/28/2016   ALT 14 03/28/2016   AST 18 03/28/2016   NA 138 03/28/2016   K 3.7 03/28/2016   CL 105 03/28/2016   CREATININE 0.72 03/28/2016   BUN 20 03/28/2016   CO2 24 03/28/2016   TSH 2.69 03/28/2016    Mm Digital Screening Bilateral  Result Date: 03/14/2016 CLINICAL DATA:  Screening. EXAM: DIGITAL SCREENING BILATERAL MAMMOGRAM WITH CAD COMPARISON:  Previous exam(s). ACR Breast Density Category c: The breast tissue is heterogeneously dense, which may obscure small masses. FINDINGS: There are no findings suspicious for malignancy. Images were processed with CAD. IMPRESSION: No mammographic evidence of malignancy. A result letter of this screening mammogram will be mailed directly to the patient. RECOMMENDATION: Screening mammogram in one year. (Code:SM-B-01Y) BI-RADS CATEGORY  1: Negative. Electronically Signed   By: Lillia Mountain M.D.   On: 03/14/2016 12:14       Assessment & Plan:   Problem List Items Addressed This Visit    GERD (gastroesophageal reflux disease)    Controlled on nexium.  Follow.       Hypercholesterolemia    Low cholesterol diet and exercise.  Follow lipid panel.        Relevant Orders   CBC with Differential/Platelet (Completed)   TSH (Completed)   Lipid panel (Completed)   Comprehensive metabolic panel (Completed)   Insomnia - Primary    Melissa Higgins actually is sleeping, just does not feel rested.  Increased daytime somnolence.  Schedule split night sleep study.        Joint  ache    Multiple joint aches as outlined.  Stiffness.  Moving does help. Discussed exercise and stretches.  Check rheum panel.        Relevant Orders   Sedimentation rate (Completed)   ANA (Completed)   Rheumatoid factor (Completed)   Stress    Increased stress.  Discussed with her today.  Pursue sleep study.  Hold on further medication.  Follow.         Other Visit Diagnoses    Other fatigue       Relevant Orders   Ambulatory referral to Sleep Studies   Left foot pain  Persistent.  has tried supports.  discussed stretches.  refer to podiatry.     Relevant Orders   Ambulatory referral to Podiatry       Einar Pheasant, MD

## 2016-03-29 ENCOUNTER — Encounter: Payer: Self-pay | Admitting: Internal Medicine

## 2016-03-29 ENCOUNTER — Encounter: Payer: Self-pay | Admitting: Obstetrics and Gynecology

## 2016-03-29 DIAGNOSIS — R768 Other specified abnormal immunological findings in serum: Secondary | ICD-10-CM

## 2016-03-29 DIAGNOSIS — M2550 Pain in unspecified joint: Secondary | ICD-10-CM

## 2016-03-29 LAB — RHEUMATOID FACTOR: Rhuematoid fact SerPl-aCnc: <14 IU/mL (ref <14)

## 2016-03-30 LAB — ANA: Anti Nuclear Antibody(ANA): POSITIVE — AB

## 2016-03-30 LAB — ANTI-NUCLEAR AB-TITER (ANA TITER): ANA Titer 1: 1:320 {titer} — ABNORMAL HIGH

## 2016-04-03 ENCOUNTER — Encounter: Payer: Self-pay | Admitting: Internal Medicine

## 2016-04-03 NOTE — Assessment & Plan Note (Signed)
Increased stress.  Discussed with her today.  Pursue sleep study.  Hold on further medication.  Follow.

## 2016-04-03 NOTE — Assessment & Plan Note (Signed)
Multiple joint aches as outlined.  Stiffness.  Moving does help. Discussed exercise and stretches.  Check rheum panel.

## 2016-04-03 NOTE — Assessment & Plan Note (Signed)
Low cholesterol diet and exercise.  Follow lipid panel.   

## 2016-04-03 NOTE — Assessment & Plan Note (Signed)
She actually is sleeping, just does not feel rested.  Increased daytime somnolence.  Schedule split night sleep study.

## 2016-04-03 NOTE — Assessment & Plan Note (Signed)
Controlled on nexium.  Follow.  

## 2016-04-05 ENCOUNTER — Other Ambulatory Visit: Payer: Self-pay

## 2016-04-05 DIAGNOSIS — B009 Herpesviral infection, unspecified: Secondary | ICD-10-CM

## 2016-04-05 MED ORDER — VALACYCLOVIR HCL 500 MG PO TABS: 500.0000 mg | ORAL_TABLET | Freq: Two times a day (BID) | ORAL | 3 refills | Status: DC

## 2016-04-06 NOTE — Telephone Encounter (Signed)
Order placed for rheumatology referral.  

## 2016-04-08 ENCOUNTER — Ambulatory Visit: Payer: BC Managed Care – PPO | Admitting: Podiatry

## 2016-04-19 ENCOUNTER — Ambulatory Visit (INDEPENDENT_AMBULATORY_CARE_PROVIDER_SITE_OTHER): Payer: BC Managed Care – PPO

## 2016-04-19 ENCOUNTER — Ambulatory Visit (INDEPENDENT_AMBULATORY_CARE_PROVIDER_SITE_OTHER): Payer: BC Managed Care – PPO | Admitting: Podiatry

## 2016-04-19 DIAGNOSIS — M79672 Pain in left foot: Secondary | ICD-10-CM | POA: Diagnosis not present

## 2016-04-19 DIAGNOSIS — M722 Plantar fascial fibromatosis: Secondary | ICD-10-CM

## 2016-04-19 MED ORDER — MELOXICAM 15 MG PO TABS: 15.0000 mg | ORAL_TABLET | Freq: Every day | ORAL | 1 refills | Status: AC

## 2016-04-20 NOTE — Progress Notes (Signed)
   Subjective: Patient presents today for throbbing pain and tenderness in the left foot. Patient states the foot pain has been hurting for about 4 months now. Patient states that it hurts in the mornings with the first steps out of bed. She has not done anything to treat pain. Patient presents today for further treatment and evaluation  Objective: Physical Exam General: The patient is alert and oriented x3 in no acute distress.  Dermatology: Skin is warm, dry and supple bilateral lower extremities. Negative for open lesions or macerations bilateral.   Vascular: Dorsalis Pedis and Posterior Tibial pulses palpable bilateral.  Capillary fill time is immediate to all digits.  Neurological: Epicritic and protective threshold intact bilateral.   Musculoskeletal: Tenderness to palpation at the medial calcaneal tubercale and through the insertion of the plantar fascia of the left foot. All other joints range of motion within normal limits bilateral. Strength 5/5 in all groups bilateral.   Radiographic exam:   Normal osseous mineralization. Joint spaces preserved. No fracture/dislocation/boney destruction. Calcaneal spur present with mild thickening of plantar fascia left. No other soft tissue abnormalities or radiopaque foreign bodies.   Assessment: 1. Plantar fasciitis left foot 2. Pain in left foot  Plan of Care:   1. Patient evaluated. Xrays reviewed.   2. Injection of 0.5cc Celestone soluspan injected into the left plantar fascia.  3. Instructed patient regarding therapies and modalities at home to alleviate symptoms.  4. Rx for meloxicam 15mg  PO given to patient.  5. Plantar fascial band(s) dispensed. 6. Return to clinic in 4 weeks.    Melissa Higgins, DPM [facility]  Dr. Edrick Higgins, [address]                                        [place], [place] [address]                Office [phone]  Fax [phone]

## 2016-04-22 MED ORDER — BETAMETHASONE SOD PHOS & ACET 6 (3-3) MG/ML IJ SUSP: 3.0000 mg | Freq: Once | INTRAMUSCULAR | Status: DC

## 2016-04-23 ENCOUNTER — Encounter: Payer: Self-pay | Admitting: Internal Medicine

## 2016-05-10 DIAGNOSIS — R768 Other specified abnormal immunological findings in serum: Secondary | ICD-10-CM | POA: Insufficient documentation

## 2016-05-17 ENCOUNTER — Ambulatory Visit (INDEPENDENT_AMBULATORY_CARE_PROVIDER_SITE_OTHER): Payer: BC Managed Care – PPO | Admitting: Podiatry

## 2016-05-17 DIAGNOSIS — M722 Plantar fascial fibromatosis: Secondary | ICD-10-CM

## 2016-05-19 NOTE — Progress Notes (Signed)
   Subjective: Patient presents today for follow up evaluation of plantar fasciitis of the left foot. Patient states the foot pain is resolving. She states the injection and Meloxicam have helped alleviate the symptoms. Patient presents today for further treatment and evaluation  Objective: Physical Exam General: The patient is alert and oriented x3 in no acute distress.  Dermatology: Skin is warm, dry and supple bilateral lower extremities. Negative for open lesions or macerations bilateral.   Vascular: Dorsalis Pedis and Posterior Tibial pulses palpable bilateral.  Capillary fill time is immediate to all digits.  Neurological: Epicritic and protective threshold intact bilateral.   Musculoskeletal: Tenderness to palpation at the medial calcaneal tubercale and through the insertion of the plantar fascia of the left foot. All other joints range of motion within normal limits bilateral. Strength 5/5 in all groups bilateral.     Assessment: 1. Plantar fasciitis left foot 2. Pain in left foot  Plan of Care:   1. Patient evaluated. Xrays reviewed.   2. Injection of 0.5cc Celestone soluspan injected into the left plantar fascia.  3. Instructed patient regarding therapies and modalities at home to alleviate symptoms.  4. Continue Meloxicam and Dansko sandals. 5. Recommended vionic sandals.  6. Return to clinic as needed.    Edrick Kins, DPM [facility]  Dr. Edrick Kins, [address]                                        [place], [place] [address]                Office [phone]  Fax [phone]

## 2016-05-23 ENCOUNTER — Encounter: Payer: Self-pay | Admitting: Internal Medicine

## 2016-05-23 NOTE — Telephone Encounter (Signed)
Ok to place in this spot for work in appt.

## 2016-05-24 ENCOUNTER — Encounter: Payer: Self-pay | Admitting: Internal Medicine

## 2016-05-24 NOTE — Telephone Encounter (Signed)
Patient has been rescheduled.

## 2016-05-24 NOTE — Telephone Encounter (Signed)
Please call and let her know that I am going to be out of the office for a few days within the next week, so I am not sure I can work her in within the next week (other than tomorrow during lunch).  We can send Caryl Pina a note to hold for work in/cancellation and see if an open spot becomes available within the next week.

## 2016-05-25 ENCOUNTER — Ambulatory Visit: Payer: BC Managed Care – PPO | Admitting: Internal Medicine

## 2016-05-26 ENCOUNTER — Encounter: Payer: Self-pay | Admitting: Internal Medicine

## 2016-05-26 ENCOUNTER — Ambulatory Visit (INDEPENDENT_AMBULATORY_CARE_PROVIDER_SITE_OTHER): Payer: BC Managed Care – PPO | Admitting: Internal Medicine

## 2016-05-26 VITALS — BP 110/62 | HR 75 | Temp 98.6°F | Resp 12 | Wt 238.8 lb

## 2016-05-26 DIAGNOSIS — F439 Reaction to severe stress, unspecified: Secondary | ICD-10-CM | POA: Diagnosis not present

## 2016-05-26 DIAGNOSIS — F329 Major depressive disorder, single episode, unspecified: Secondary | ICD-10-CM | POA: Diagnosis not present

## 2016-05-26 DIAGNOSIS — G479 Sleep disorder, unspecified: Secondary | ICD-10-CM

## 2016-05-26 DIAGNOSIS — G4700 Insomnia, unspecified: Secondary | ICD-10-CM

## 2016-05-26 DIAGNOSIS — F32A Depression, unspecified: Secondary | ICD-10-CM

## 2016-05-26 MED ORDER — BETAMETHASONE SOD PHOS & ACET 6 (3-3) MG/ML IJ SUSP: 3.0000 mg | Freq: Once | INTRAMUSCULAR | Status: DC

## 2016-05-26 NOTE — Progress Notes (Signed)
Patient ID: Melissa Higgins, female   DOB: 26-Nov-1975, 41 y.o.   MRN: [id number]   Subjective:    Patient ID: Melissa Higgins, female    DOB: 18-Jan-1975, 41 y.o.   MRN: [id number]  HPI  Patient here as a work in with concerns regarding increased fatigue and decreased energy.  Increased stress with her work.  Also trying to get pregnant.  Her grandmother has been having some medical issues as well.  Has had some decreased appetite.  Feeling some better this week.  Feels her symptoms may be related to some underlying depression.  She has tried SSRI in the past.  Discussed treatment.  Discussed the fact she is trying to get pregnant.  Will have psychiatry evaluate and help determine best treatment regimen.  She is in agreement.     Past Medical History:  Diagnosis Date  . C. difficile colitis    augment use  . Depression   . Dysmenorrhea   . Environmental allergies    allergy shots  . History of frequent urinary tract infections   . Hypercholesterolemia    Past Surgical History:  Procedure Laterality Date  . NASAL SINUS SURGERY  2003   Family History  Problem Relation Age of Onset  . Hyperlipidemia Mother   . Asthma Father   . Hyperlipidemia Father   . Allergies Father   . Breast cancer Neg Hx    Social History   Social History  . Marital status: Single    Spouse name: N/A  . Number of children: 0  . Years of education: college   Social History Main Topics  . Smoking status: Never Smoker  . Smokeless tobacco: Never Used  . Alcohol use 0.0 oz/week     Comment: occasional  . Drug use: No  . Sexual activity: Yes    Birth control/ protection: None   Other Topics Concern  . 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    Outpatient Encounter Prescriptions as of 05/26/2016  Medication Sig  . ciprofloxacin (CIPRO) 250 MG tablet Take 1 tablet (250 mg total) by mouth as  needed.  Marland Kitchen EPINEPHrine 0.3 mg/0.3 mL IJ SOAJ injection   . esomeprazole (NEXIUM) 40 MG capsule TAKE 1 CAPSULE(40 MG) BY MOUTH TWICE DAILY BEFORE A MEAL  . Loratadine 10 MG CAPS Take by mouth.  . meloxicam (MOBIC) 15 MG tablet Take 1 tablet by mouth daily as needed.  . valACYclovir (VALTREX) 500 MG tablet Take 1 tablet (500 mg total) by mouth 2 (two) times daily.   Facility-Administered Encounter Medications as of 05/26/2016  Medication  . betamethasone acetate-betamethasone sodium phosphate (CELESTONE) injection 3 mg    Review of Systems  Constitutional: Positive for fatigue. Negative for unexpected weight change.       Some decreased appetite.    HENT: Negative for congestion and sinus pressure.   Respiratory: Negative for cough, chest tightness and shortness of breath.   Cardiovascular: Negative for chest pain, palpitations and leg swelling.  Gastrointestinal: Negative for abdominal pain, diarrhea, nausea and vomiting.  Genitourinary: Negative for difficulty urinating and dysuria.  Musculoskeletal: Negative for back pain and joint swelling.  Neurological: Negative for dizziness, light-headedness and headaches.  Psychiatric/Behavioral: Positive for sleep disturbance. Negative for suicidal ideas.       Increased stress and depression as outlined.  Objective:    Physical Exam  Constitutional: She appears well-developed and well-nourished. No distress.  HENT:  Nose: Nose normal.  Mouth/Throat: Oropharynx is clear and moist.  Neck: Neck supple. No thyromegaly present.  Cardiovascular: Normal rate and regular rhythm.   Pulmonary/Chest: Breath sounds normal. No respiratory distress. She has no wheezes.  Abdominal: Soft. Bowel sounds are normal. There is no tenderness.  Musculoskeletal: She exhibits no edema or tenderness.  Lymphadenopathy:    She has no cervical adenopathy.  Skin: No rash noted. No erythema.  Psychiatric: She has a normal mood and affect. Her behavior is  normal.    BP 110/62 (BP Location: Left Arm, Patient Position: Sitting, Cuff Size: Normal)   Pulse 75   Temp 98.6 F (37 C) (Oral)   Resp 12   Wt 238 lb 12.8 oz (108.3 kg)   SpO2 96%   BMI 34.26 kg/m  Wt Readings from Last 3 Encounters:  05/26/16 238 lb 12.8 oz (108.3 kg)  03/28/16 239 lb 3.2 oz (108.5 kg)  01/18/16 239 lb 4.8 oz (108.5 kg)     Lab Results  Component Value Date   WBC 8.3 03/28/2016   HGB 13.9 03/28/2016   HCT 42.7 03/28/2016   PLT 285.0 03/28/2016   GLUCOSE 90 03/28/2016   CHOL 201 (H) 03/28/2016   TRIG 74.0 03/28/2016   HDL 49.80 03/28/2016   LDLCALC 137 (H) 03/28/2016   ALT 14 03/28/2016   AST 18 03/28/2016   NA 138 03/28/2016   K 3.7 03/28/2016   CL 105 03/28/2016   CREATININE 0.72 03/28/2016   BUN 20 03/28/2016   CO2 24 03/28/2016   TSH 2.69 03/28/2016    Mm Digital Screening Bilateral  Result Date: 03/14/2016 CLINICAL DATA:  Screening. EXAM: DIGITAL SCREENING BILATERAL MAMMOGRAM WITH CAD COMPARISON:  Previous exam(s). ACR Breast Density Category c: The breast tissue is heterogeneously dense, which may obscure small masses. FINDINGS: There are no findings suspicious for malignancy. Images were processed with CAD. IMPRESSION: No mammographic evidence of malignancy. A result letter of this screening mammogram will be mailed directly to the patient. RECOMMENDATION: Screening mammogram in one year. (Code:SM-B-01Y) BI-RADS CATEGORY  1: Negative. Electronically Signed   By: Lillia Mountain M.D.   On: 03/14/2016 12:14       Assessment & Plan:   Problem List Items Addressed This Visit    Difficulty sleeping    Sleep is still an issue.  Discussed with her today.  Plan for psychiatry referral as outlined.        Insomnia   Relevant Orders   Ambulatory referral to Psychiatry   Stress    Increased stress as outlined.  Feels some depression.  Discussed at length with her today.  Trying to get pregnant.  Has tried SSRI previously.  Discussed psychiatry  evaluation to determine best treatment plan.  Pt in agreement.        Relevant Orders   Ambulatory referral to Psychiatry    Other Visit Diagnoses    Depression, unspecified depression type    -  Primary   Relevant Orders   Ambulatory referral to Psychiatry      I spent 25 minutes with the patient and more than 50% of the time was spent in consultation regarding the above.  Time spent discussing her current concerns and issues and treatment plan.     Einar Pheasant, MD

## 2016-05-26 NOTE — Progress Notes (Signed)
Pre-visit discussion using our clinic review tool. No additional management support is needed unless otherwise documented below in the visit note.  

## 2016-06-02 ENCOUNTER — Encounter: Payer: Self-pay | Admitting: Internal Medicine

## 2016-06-06 ENCOUNTER — Encounter: Payer: Self-pay | Admitting: Internal Medicine

## 2016-06-06 NOTE — Assessment & Plan Note (Signed)
Sleep is still an issue.  Discussed with her today.  Plan for psychiatry referral as outlined.

## 2016-06-06 NOTE — Assessment & Plan Note (Signed)
Increased stress as outlined.  Feels some depression.  Discussed at length with her today.  Trying to get pregnant.  Has tried SSRI previously.  Discussed psychiatry evaluation to determine best treatment plan.  Pt in agreement.

## 2016-06-07 ENCOUNTER — Encounter: Payer: Self-pay | Admitting: Internal Medicine

## 2016-07-22 ENCOUNTER — Telehealth: Payer: Self-pay | Admitting: Obstetrics and Gynecology

## 2016-07-22 NOTE — Telephone Encounter (Signed)
Called pt no answer, LM for pt informing her that it is ok to begin Zoloft while trying to conceive, per Dr.cherry. To call back for questions or concerns.

## 2016-07-22 NOTE — Telephone Encounter (Signed)
Patient seen her PCP and she wants to start her on zoloft - she wants to know if that would be okay since she is trying to conceive  Please call

## 2016-08-22 ENCOUNTER — Encounter: Payer: Self-pay | Admitting: Internal Medicine

## 2016-08-22 ENCOUNTER — Ambulatory Visit (INDEPENDENT_AMBULATORY_CARE_PROVIDER_SITE_OTHER): Payer: BC Managed Care – PPO | Admitting: Internal Medicine

## 2016-08-22 DIAGNOSIS — K219 Gastro-esophageal reflux disease without esophagitis: Secondary | ICD-10-CM | POA: Diagnosis not present

## 2016-08-22 DIAGNOSIS — E7800 Pure hypercholesterolemia, unspecified: Secondary | ICD-10-CM

## 2016-08-22 DIAGNOSIS — F439 Reaction to severe stress, unspecified: Secondary | ICD-10-CM

## 2016-08-22 DIAGNOSIS — G4700 Insomnia, unspecified: Secondary | ICD-10-CM

## 2016-08-22 NOTE — Progress Notes (Signed)
Patient ID: Melissa Higgins, female   DOB: Jan 31, 1975, 41 y.o.   MRN: [id number]   Subjective:    Patient ID: Melissa Higgins, female    DOB: 1975/08/26, 41 y.o.   MRN: [id number]  HPI  Patient here for a scheduled follow up.  She is seeing Dr Nicolasa Ducking.  On zoloft.  Feels better.  Summer has been good.  Stress is better.  Still with increased stress.  Trying to get pregnant.  Discussed with her today.  She plans to f/u with gyn.  Starting back to school soon.  Some anxiety related to this.  Seeing a counselor as well.  Sharyn Lull - [facility]).  Has met with her once.  Plans to see on a more regular basis.  No chest pain.  No cough or sob.  No sinus issues.  No acid reflux.  No abdominal pain.  Bowels stable.     Past Medical History:  Diagnosis Date  . C. difficile colitis    augment use  . Depression   . Dysmenorrhea   . Environmental allergies    allergy shots  . History of frequent urinary tract infections   . Hypercholesterolemia    Past Surgical History:  Procedure Laterality Date  . NASAL SINUS SURGERY  2003   Family History  Problem Relation Age of Onset  . Hyperlipidemia Mother   . Asthma Father   . Hyperlipidemia Father   . Allergies Father   . Breast cancer Neg Hx    Social History   Social History  . Marital status: Single    Spouse name: N/A  . Number of children: 0  . Years of education: college   Social History Main Topics  . Smoking status: Never Smoker  . Smokeless tobacco: Never Used  . Alcohol use 0.0 oz/week     Comment: occasional  . Drug use: No  . Sexual activity: Yes    Birth control/ protection: None   Other Topics Concern  . 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    Outpatient Encounter Prescriptions as of 08/22/2016  Medication Sig  . ciprofloxacin (CIPRO) 250 MG tablet Take 1 tablet (250 mg total) by mouth as needed.  Marland Kitchen EPINEPHrine 0.3  mg/0.3 mL IJ SOAJ injection   . esomeprazole (NEXIUM) 40 MG capsule TAKE 1 CAPSULE(40 MG) BY MOUTH TWICE DAILY BEFORE A MEAL  . Loratadine 10 MG CAPS Take by mouth.  . meloxicam (MOBIC) 15 MG tablet Take 1 tablet by mouth daily as needed.  . valACYclovir (VALTREX) 500 MG tablet Take 1 tablet (500 mg total) by mouth 2 (two) times daily.  . sertraline (ZOLOFT) 50 MG tablet Take 50 mg by mouth daily.   Facility-Administered Encounter Medications as of 08/22/2016  Medication  . betamethasone acetate-betamethasone sodium phosphate (CELESTONE) injection 3 mg  . betamethasone acetate-betamethasone sodium phosphate (CELESTONE) injection 3 mg    Review of Systems  Constitutional: Negative for appetite change and unexpected weight change.  HENT: Negative for congestion and sinus pressure.   Respiratory: Negative for cough, chest tightness and shortness of breath.   Cardiovascular: Negative for chest pain, palpitations and leg swelling.  Gastrointestinal: Negative for abdominal pain, diarrhea, nausea and vomiting.  Genitourinary: Negative for difficulty urinating and dysuria.  Musculoskeletal: Negative for back pain and joint swelling.  Skin: Negative for color change and rash.  Neurological: Negative for  dizziness, light-headedness and headaches.  Psychiatric/Behavioral: Negative for agitation and dysphoric mood.       Objective:    Physical Exam  Constitutional: She appears well-developed and well-nourished. No distress.  HENT:  Nose: Nose normal.  Mouth/Throat: Oropharynx is clear and moist.  Neck: Neck supple. No thyromegaly present.  Cardiovascular: Normal rate and regular rhythm.   Pulmonary/Chest: Breath sounds normal. No respiratory distress. She has no wheezes.  Abdominal: Soft. Bowel sounds are normal. There is no tenderness.  Musculoskeletal: She exhibits no edema or tenderness.  Lymphadenopathy:    She has no cervical adenopathy.  Skin: No rash noted. No erythema.    Psychiatric: She has a normal mood and affect. Her behavior is normal.    BP 110/60 (BP Location: Left Arm, Patient Position: Sitting, Cuff Size: Normal)   Pulse 71   Temp 98.1 F (36.7 C) (Oral)   Resp 12   Ht 5' 10"  (1.778 m)   Wt 246 lb (111.6 kg)   LMP 08/05/2016   SpO2 96%   BMI 35.30 kg/m  Wt Readings from Last 3 Encounters:  08/22/16 246 lb (111.6 kg)  05/26/16 238 lb 12.8 oz (108.3 kg)  03/28/16 239 lb 3.2 oz (108.5 kg)     Lab Results  Component Value Date   WBC 8.3 03/28/2016   HGB 13.9 03/28/2016   HCT 42.7 03/28/2016   PLT 285.0 03/28/2016   GLUCOSE 90 03/28/2016   CHOL 201 (H) 03/28/2016   TRIG 74.0 03/28/2016   HDL 49.80 03/28/2016   LDLCALC 137 (H) 03/28/2016   ALT 14 03/28/2016   AST 18 03/28/2016   NA 138 03/28/2016   K 3.7 03/28/2016   CL 105 03/28/2016   CREATININE 0.72 03/28/2016   BUN 20 03/28/2016   CO2 24 03/28/2016   TSH 2.69 03/28/2016    Mm Digital Screening Bilateral  Result Date: 03/14/2016 CLINICAL DATA:  Screening. EXAM: DIGITAL SCREENING BILATERAL MAMMOGRAM WITH CAD COMPARISON:  Previous exam(s). ACR Breast Density Category c: The breast tissue is heterogeneously dense, which may obscure small masses. FINDINGS: There are no findings suspicious for malignancy. Images were processed with CAD. IMPRESSION: No mammographic evidence of malignancy. A result letter of this screening mammogram will be mailed directly to the patient. RECOMMENDATION: Screening mammogram in one year. (Code:SM-B-01Y) BI-RADS CATEGORY  1: Negative. Electronically Signed   By: Lillia Mountain M.D.   On: 03/14/2016 12:14       Assessment & Plan:   Problem List Items Addressed This Visit    GERD (gastroesophageal reflux disease)    Controlled on nexium.       Hypercholesterolemia    Low cholesterol diet and exercise.  Follow lipid panel.        Relevant Orders   Comprehensive metabolic panel   Lipid panel   Insomnia    Is sleeping some better.  Follow.        Stress    Increased stress as outlined.  Is better.  Seeing Dr Nicolasa Ducking and counselor.  Doing better.  On zoloft.  Follow.            Einar Pheasant, MD

## 2016-08-22 NOTE — Progress Notes (Signed)
Pre-visit discussion using our clinic review tool. No additional management support is needed unless otherwise documented below in the visit note.  

## 2016-08-23 ENCOUNTER — Encounter: Payer: Self-pay | Admitting: Internal Medicine

## 2016-08-23 NOTE — Assessment & Plan Note (Signed)
Controlled on nexium.  

## 2016-08-23 NOTE — Assessment & Plan Note (Signed)
Low cholesterol diet and exercise.  Follow lipid panel.   

## 2016-08-23 NOTE — Assessment & Plan Note (Signed)
Increased stress as outlined.  Is better.  Seeing Dr Nicolasa Ducking and counselor.  Doing better.  On zoloft.  Follow.

## 2016-08-23 NOTE — Assessment & Plan Note (Signed)
Is sleeping some better.  Follow.

## 2016-10-31 ENCOUNTER — Other Ambulatory Visit: Payer: BC Managed Care – PPO

## 2016-11-25 ENCOUNTER — Other Ambulatory Visit (INDEPENDENT_AMBULATORY_CARE_PROVIDER_SITE_OTHER): Payer: BC Managed Care – PPO

## 2016-11-25 DIAGNOSIS — E7800 Pure hypercholesterolemia, unspecified: Secondary | ICD-10-CM

## 2016-11-25 NOTE — Addendum Note (Signed)
Addended by: Arby Barrette on: 11/25/2016 08:52 AM   Modules accepted: Orders

## 2016-11-25 NOTE — Addendum Note (Signed)
Addended by: Arby Barrette on: 11/25/2016 08:51 AM   Modules accepted: Orders

## 2016-11-26 LAB — COMPREHENSIVE METABOLIC PANEL
AG RATIO: 1.3 (calc) (ref 1.0–2.5)
ALKALINE PHOSPHATASE (APISO): 105 U/L (ref 33–115)
ALT: 11 U/L (ref 6–29)
AST: 16 U/L (ref 10–30)
Albumin: 3.9 g/dL (ref 3.6–5.1)
BILIRUBIN TOTAL: 0.4 mg/dL (ref 0.2–1.2)
BUN: 21 mg/dL (ref 7–25)
CALCIUM: 8.9 mg/dL (ref 8.6–10.2)
CO2: 25 mmol/L (ref 20–32)
Chloride: 104 mmol/L (ref 98–110)
Creat: 0.72 mg/dL (ref 0.50–1.10)
Globulin: 3 g/dL (calc) (ref 1.9–3.7)
Glucose, Bld: 91 mg/dL (ref 65–99)
POTASSIUM: 4 mmol/L (ref 3.5–5.3)
Sodium: 138 mmol/L (ref 135–146)
Total Protein: 6.9 g/dL (ref 6.1–8.1)

## 2016-11-26 LAB — LIPID PANEL
CHOLESTEROL: 221 mg/dL — AB (ref <200)
HDL: 42 mg/dL — ABNORMAL LOW (ref >50)
LDL CHOLESTEROL (CALC): 155 mg/dL — AB
Non-HDL Cholesterol (Calc): 179 mg/dL (calc) — ABNORMAL HIGH (ref <130)
TRIGLYCERIDES: 120 mg/dL (ref <150)
Total CHOL/HDL Ratio: 5.3 (calc) — ABNORMAL HIGH (ref <5.0)

## 2016-11-28 ENCOUNTER — Encounter: Payer: Self-pay | Admitting: Internal Medicine

## 2016-12-06 ENCOUNTER — Other Ambulatory Visit: Payer: BC Managed Care – PPO

## 2016-12-08 ENCOUNTER — Other Ambulatory Visit (HOSPITAL_COMMUNITY)
Admission: RE | Admit: 2016-12-08 | Discharge: 2016-12-08 | Disposition: A | Discharge status: Home / Self Care | Payer: BC Managed Care – PPO | Source: Ambulatory Visit | Attending: Internal Medicine | Admitting: Internal Medicine

## 2016-12-08 ENCOUNTER — Encounter: Payer: Self-pay | Admitting: Internal Medicine

## 2016-12-08 ENCOUNTER — Ambulatory Visit (INDEPENDENT_AMBULATORY_CARE_PROVIDER_SITE_OTHER): Payer: BC Managed Care – PPO | Admitting: Internal Medicine

## 2016-12-08 VITALS — BP 118/78 | HR 85 | Temp 98.0°F | Resp 18 | Ht 70.08 in | Wt 250.2 lb

## 2016-12-08 DIAGNOSIS — Z0000 Encounter for general adult medical examination without abnormal findings: Secondary | ICD-10-CM

## 2016-12-08 DIAGNOSIS — Z3169 Encounter for other general counseling and advice on procreation: Secondary | ICD-10-CM

## 2016-12-08 DIAGNOSIS — B9689 Other specified bacterial agents as the cause of diseases classified elsewhere: Secondary | ICD-10-CM | POA: Diagnosis not present

## 2016-12-08 DIAGNOSIS — F439 Reaction to severe stress, unspecified: Secondary | ICD-10-CM

## 2016-12-08 DIAGNOSIS — Z0001 Encounter for general adult medical examination with abnormal findings: Secondary | ICD-10-CM

## 2016-12-08 DIAGNOSIS — Z9109 Other allergy status, other than to drugs and biological substances: Secondary | ICD-10-CM | POA: Diagnosis not present

## 2016-12-08 DIAGNOSIS — R87619 Unspecified abnormal cytological findings in specimens from cervix uteri: Secondary | ICD-10-CM | POA: Diagnosis not present

## 2016-12-08 DIAGNOSIS — L989 Disorder of the skin and subcutaneous tissue, unspecified: Secondary | ICD-10-CM | POA: Diagnosis not present

## 2016-12-08 DIAGNOSIS — E7800 Pure hypercholesterolemia, unspecified: Secondary | ICD-10-CM | POA: Diagnosis not present

## 2016-12-08 DIAGNOSIS — K219 Gastro-esophageal reflux disease without esophagitis: Secondary | ICD-10-CM

## 2016-12-08 DIAGNOSIS — Z124 Encounter for screening for malignant neoplasm of cervix: Secondary | ICD-10-CM | POA: Insufficient documentation

## 2016-12-08 DIAGNOSIS — N760 Acute vaginitis: Secondary | ICD-10-CM | POA: Insufficient documentation

## 2016-12-08 DIAGNOSIS — G479 Sleep disorder, unspecified: Secondary | ICD-10-CM | POA: Diagnosis not present

## 2016-12-08 NOTE — Progress Notes (Signed)
Patient ID: Melissa Higgins, female   DOB: 11-25-1975, 41 y.o.   MRN: [id number]   Subjective:    Patient ID: Melissa Higgins, female    DOB: January 05, 1976, 41 y.o.   MRN: [id number]  HPI  Patient here for her physical exam.  She is concerned regarding not being able to lose weight.  She has just started back exercising.  Discussed diet and exercise.  No chest pain.  No sob.  Still with increased stress.  Trying to get pregnant.  Has been one year - with trying and monitoring her ovulation cycle, etc.  Concerned because she has not been able to get pregnant.  No acid reflux.  No abdominal pain.  Bowels moving.  Persistent right shoulder lesion.  Wants referral to dermatology.  Still with stress with her job, but is better.  Discussed with her today.    Past Medical History:  Diagnosis Date  . C. difficile colitis    augment use  . Depression   . Dysmenorrhea   . Environmental allergies    allergy shots  . History of frequent urinary tract infections   . Hypercholesterolemia    Past Surgical History:  Procedure Laterality Date  . NASAL SINUS SURGERY  2003   Family History  Problem Relation Age of Onset  . Hyperlipidemia Mother   . Asthma Father   . Hyperlipidemia Father   . Allergies Father   . Breast cancer Neg Hx    Social History   Socioeconomic History  . Marital status: Married    Spouse name: None  . Number of children: 0  . Years of education: college  . Highest education level: None  Social Needs  . Financial resource strain: None  . Food insecurity - worry: None  . Food insecurity - inability: None  . Transportation needs - medical: None  . Transportation needs - non-medical: None  Occupational History  . None  Tobacco Use  . Smoking status: Never Smoker  . Smokeless tobacco: Never Used  Substance and Sexual Activity  . Alcohol use: Yes    Alcohol/week: 0.0 oz    Comment: occasional  . Drug use: No  . Sexual activity: Yes    Birth control/protection: None    Other Topics Concern  . 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    Outpatient Encounter Medications as of 12/08/2016  Medication Sig  . ciprofloxacin (CIPRO) 250 MG tablet Take 1 tablet (250 mg total) by mouth as needed.  Marland Kitchen EPINEPHrine 0.3 mg/0.3 mL IJ SOAJ injection   . esomeprazole (NEXIUM) 40 MG capsule TAKE 1 CAPSULE(40 MG) BY MOUTH TWICE DAILY BEFORE A MEAL  . Loratadine 10 MG CAPS Take by mouth.  . meloxicam (MOBIC) 15 MG tablet Take 1 tablet by mouth daily as needed.  . sertraline (ZOLOFT) 50 MG tablet Take 50 mg by mouth daily.  . valACYclovir (VALTREX) 500 MG tablet Take 1 tablet (500 mg total) by mouth 2 (two) times daily.   Facility-Administered Encounter Medications as of 12/08/2016  Medication  . betamethasone acetate-betamethasone sodium phosphate (CELESTONE) injection 3 mg  . betamethasone acetate-betamethasone sodium phosphate (CELESTONE) injection 3 mg    Review of Systems  Constitutional: Negative for appetite change.       Concerned regarding weight gain.   HENT: Negative for congestion and sinus pressure.   Eyes: Negative for pain and  visual disturbance.  Respiratory: Negative for cough, chest tightness and shortness of breath.   Cardiovascular: Negative for chest pain, palpitations and leg swelling.  Gastrointestinal: Negative for abdominal pain, diarrhea, nausea and vomiting.  Genitourinary: Negative for difficulty urinating and dysuria.  Musculoskeletal: Negative for back pain and joint swelling.  Skin: Negative for color change and rash.  Neurological: Negative for dizziness, light-headedness and headaches.  Hematological: Negative for adenopathy. Does not bruise/bleed easily.  Psychiatric/Behavioral: Negative for agitation and dysphoric mood.       Increased stress as outlined.         Objective:    Physical Exam  Constitutional: She is  oriented to person, place, and time. She appears well-developed and well-nourished. No distress.  HENT:  Nose: Nose normal.  Mouth/Throat: Oropharynx is clear and moist.  Eyes: Right eye exhibits no discharge. Left eye exhibits no discharge. No scleral icterus.  Neck: Neck supple. No thyromegaly present.  Cardiovascular: Normal rate and regular rhythm.  Pulmonary/Chest: Breath sounds normal. No accessory muscle usage. No tachypnea. No respiratory distress. She has no decreased breath sounds. She has no wheezes. She has no rhonchi. Right breast exhibits no inverted nipple, no mass, no nipple discharge and no tenderness (no axillary adenopathy). Left breast exhibits no inverted nipple, no mass, no nipple discharge and no tenderness (no axilarry adenopathy).  Abdominal: Soft. Bowel sounds are normal. There is no tenderness.  Genitourinary:  Genitourinary Comments: Normal external genitalia.  Vaginal vault without lesions.  Cervix identified.  Pap smear performed.  Could not appreciate any adnexal masses or tenderness.    Musculoskeletal: She exhibits no edema or tenderness.  Lymphadenopathy:    She has no cervical adenopathy.  Neurological: She is alert and oriented to person, place, and time.  Skin: Skin is warm. No rash noted. No erythema.  Skin lesion (appears to be skin tag) - right shoulder.   Psychiatric: She has a normal mood and affect. Her behavior is normal.    BP 118/78 (BP Location: Left Arm, Patient Position: Sitting, Cuff Size: Normal)   Pulse 85   Temp 98 F (36.7 C) (Oral)   Resp 18   Ht 5' 10.08" (1.78 m)   Wt 250 lb 3.2 oz (113.5 kg)   LMP 11/18/2016 (Exact Date)   SpO2 97%   BMI 35.82 kg/m  Wt Readings from Last 3 Encounters:  12/08/16 250 lb 3.2 oz (113.5 kg)  08/22/16 246 lb (111.6 kg)  05/26/16 238 lb 12.8 oz (108.3 kg)     Lab Results  Component Value Date   WBC 8.3 03/28/2016   HGB 13.9 03/28/2016   HCT 42.7 03/28/2016   PLT 285.0 03/28/2016   GLUCOSE  91 11/25/2016   CHOL 221 (H) 11/25/2016   TRIG 120 11/25/2016   HDL 42 (L) 11/25/2016   LDLCALC 137 (H) 03/28/2016   ALT 11 11/25/2016   AST 16 11/25/2016   NA 138 11/25/2016   K 4.0 11/25/2016   CL 104 11/25/2016   CREATININE 0.72 11/25/2016   BUN 21 11/25/2016   CO2 25 11/25/2016   TSH 2.69 03/28/2016    Mm Digital Screening Bilateral  Result Date: 03/14/2016 CLINICAL DATA:  Screening. EXAM: DIGITAL SCREENING BILATERAL MAMMOGRAM WITH CAD COMPARISON:  Previous exam(s). ACR Breast Density Category c: The breast tissue is heterogeneously dense, which may obscure small masses. FINDINGS: There are no findings suspicious for malignancy. Images were processed with CAD. IMPRESSION: No mammographic evidence of malignancy. A result letter of this screening mammogram  will be mailed directly to the patient. RECOMMENDATION: Screening mammogram in one year. (Code:SM-B-01Y) BI-RADS CATEGORY  1: Negative. Electronically Signed   By: Lillia Mountain M.D.   On: 03/14/2016 12:14       Assessment & Plan:   Problem List Items Addressed This Visit    Abnormal Pap smear of cervix    Seeing gyn.  Request pap today.       Difficulty sleeping    Intermittent flares.  Discussed with her today.  Seeing psychiatry.        Environmental allergies    Has seen Dr Tami Ribas.  Receiving allergy injections.  Received two today.  Currently stable.  Follow.        GERD (gastroesophageal reflux disease)    Controlled on current regimen.        Hypercholesterolemia    Low cholesterol diet and exercise.  Discussed recent labs.  Discussed diet and exercise.  Hold on statin medication.  Follow lipid panel.       Stress    Increased stress as outlined.  Discussed with her today.  Seeing psychiatry.  Follow.         Other Visit Diagnoses    Routine general medical examination at a health care facility    -  Primary   Screening for cervical cancer       Relevant Orders   Cytology - PAP   Cervicovaginal  ancillary only   Infertility counseling       Relevant Orders   Ambulatory referral to Gynecology   Skin lesion       Relevant Orders   Ambulatory referral to Dermatology       Einar Pheasant, MD

## 2016-12-11 ENCOUNTER — Encounter: Payer: Self-pay | Admitting: Internal Medicine

## 2016-12-11 NOTE — Assessment & Plan Note (Signed)
Controlled on current regimen.   

## 2016-12-11 NOTE — Assessment & Plan Note (Signed)
Low cholesterol diet and exercise.  Discussed recent labs.  Discussed diet and exercise.  Hold on statin medication.  Follow lipid panel.

## 2016-12-11 NOTE — Assessment & Plan Note (Signed)
Seeing gyn.  Request pap today.

## 2016-12-11 NOTE — Assessment & Plan Note (Signed)
Has seen Dr Tami Ribas.  Receiving allergy injections.  Received two today.  Currently stable.  Follow.

## 2016-12-11 NOTE — Assessment & Plan Note (Signed)
Increased stress as outlined.  Discussed with her today.  Seeing psychiatry.  Follow.

## 2016-12-11 NOTE — Assessment & Plan Note (Signed)
Intermittent flares.  Discussed with her today.  Seeing psychiatry.

## 2016-12-12 ENCOUNTER — Telehealth: Payer: Self-pay | Admitting: *Deleted

## 2016-12-12 ENCOUNTER — Encounter: Payer: Self-pay | Admitting: Internal Medicine

## 2016-12-12 ENCOUNTER — Ambulatory Visit: Payer: Self-pay | Admitting: *Deleted

## 2016-12-12 LAB — CERVICOVAGINAL ANCILLARY ONLY: Wet Prep (BD Affirm): POSITIVE — AB

## 2016-12-12 NOTE — Telephone Encounter (Signed)
Pt    Reports  Mild    Pain  With  Slight  Swelling  To r lower  Side of  Abdomen   Started  yest  No  Bleeding  No  Vomiting  No diarrhea     Pt  Denies  Any  Injury .  No  Gi    Symptoms     Pt  Advised  To  moinitor  Symptoms  . Does  Not  Wish  To make  An  Appointment at this time .She   Was  Advised  To   Call back if  Worse   And  To  Go  To  [place]  If  Symptoms  Become  Severe     Reason for Disposition . [1] MILD-MODERATE pain AND [2] constant and [3] present < 2 hours  Answer Assessment - Initial Assessment Questions 1. LOCATION: "Where does it hurt?"       Rlq   Along  [place]ikini  line 2. RADIATION: "Does the pain shoot anywhere else?" (e.g., chest, back)       No 3. ONSET: "When did the pain begin?" (e.g., minutes, hours or days ago)       Yest  4. SUDDEN: "Gradual or sudden onset?"       Sudden 5. PATTERN "Does the pain come and go, or is it constant?"    - If constant: "Is it getting better, staying the same, or worsening?"      (Note: Constant means the pain never goes away completely; most serious pain is constant and it progresses)     - If intermittent: "How long does it last?" "Do you have pain now?"     (Note: Intermittent means the pain goes away completely between bouts)      Constant 6. SEVERITY: "How bad is the pain?"  (e.g., Scale 1-10; mild, moderate, or severe)   - MILD (1-3): doesn't interfere with normal activities, abdomen soft and not tender to touch    - MODERATE (4-7): interferes with normal activities or awakens from sleep, tender to touch    - SEVERE (8-10): excruciating pain, doubled over, unable to do any normal activities      Mild 7. RECURRENT SYMPTOM: "Have you ever had this type of abdominal pain before?" If so, ask: "When was the last time?" and "What happened that time?"       2-3   MONTH  AGO    WNT  AWAY  ON ITS  OWN 8. CAUSE: "What do you think is causing the abdominal pain?"     nOT  SURE 9. RELIEVING/AGGRAVATING FACTORS: "What makes it better or  worse?" (e.g., movement, antacids, bowel movement)     NONE 10. OTHER SYMPTOMS: "Has there been any vomiting, diarrhea, constipation, or urine problems?"      nONE 11. PREGNANCY: "Is there any chance you are pregnant?" "When was your last menstrual period?"     Nov9  -16     May  Be  Pregnant  Protocols used: ABDOMINAL PAIN - [facility]

## 2016-12-12 NOTE — Telephone Encounter (Signed)
Seen in office.

## 2016-12-12 NOTE — Telephone Encounter (Signed)
Tried to reach patient by phone to triage no answer. PEC please triage patient for acute right lower abdominal pain.

## 2016-12-13 ENCOUNTER — Encounter: Payer: Self-pay | Admitting: Internal Medicine

## 2016-12-13 ENCOUNTER — Other Ambulatory Visit: Payer: Self-pay | Admitting: Internal Medicine

## 2016-12-13 LAB — CYTOLOGY - PAP
Diagnosis: NEGATIVE
HPV: NOT DETECTED

## 2016-12-13 MED ORDER — CLINDAMYCIN PHOSPHATE 2 % VA CREA: 1.0000 | TOPICAL_CREAM | Freq: Every day | VAGINAL | 0 refills | Status: DC

## 2016-12-13 NOTE — Progress Notes (Signed)
rx sent in for vaginal cream.  See result note.

## 2016-12-14 ENCOUNTER — Other Ambulatory Visit: Payer: Self-pay | Admitting: Obstetrics and Gynecology

## 2016-12-14 DIAGNOSIS — B009 Herpesviral infection, unspecified: Secondary | ICD-10-CM

## 2017-01-09 ENCOUNTER — Telehealth: Payer: Self-pay

## 2017-01-09 NOTE — Telephone Encounter (Signed)
Copied from [place] [id number]. Topic: Referral - Status >> Jan 09, 2017 11:50 AM Scherrie Gerlach wrote: Reason for CRM: pt states [facility], Dr Garwin Brothers has no record of the referral.   Pt got a message from their office , she called them back, and then they told her they did not have anything.  Pt is hoping you can resend please!

## 2017-01-10 HISTORY — PX: UTERINE FIBROID SURGERY: SHX826

## 2017-01-11 ENCOUNTER — Other Ambulatory Visit: Payer: Self-pay | Admitting: Internal Medicine

## 2017-01-11 NOTE — Telephone Encounter (Signed)
Referral refaxed to dr. Garwin Brothers office

## 2017-03-12 ENCOUNTER — Other Ambulatory Visit: Payer: Self-pay | Admitting: Internal Medicine

## 2017-04-10 ENCOUNTER — Other Ambulatory Visit: Payer: Self-pay | Admitting: Internal Medicine

## 2017-04-10 DIAGNOSIS — Z1231 Encounter for screening mammogram for malignant neoplasm of breast: Secondary | ICD-10-CM

## 2017-04-13 ENCOUNTER — Ambulatory Visit: Payer: BC Managed Care – PPO | Admitting: Internal Medicine

## 2017-04-13 DIAGNOSIS — R87619 Unspecified abnormal cytological findings in specimens from cervix uteri: Secondary | ICD-10-CM

## 2017-04-13 DIAGNOSIS — E7800 Pure hypercholesterolemia, unspecified: Secondary | ICD-10-CM

## 2017-04-13 DIAGNOSIS — F439 Reaction to severe stress, unspecified: Secondary | ICD-10-CM | POA: Diagnosis not present

## 2017-04-13 DIAGNOSIS — K219 Gastro-esophageal reflux disease without esophagitis: Secondary | ICD-10-CM

## 2017-04-13 DIAGNOSIS — G479 Sleep disorder, unspecified: Secondary | ICD-10-CM

## 2017-04-13 NOTE — Progress Notes (Signed)
Patient ID: Melissa Higgins, female   DOB: April 14, 1975, 42 y.o.   MRN: [id number]   Subjective:    Patient ID: Melissa Higgins, female    DOB: 1975/09/12, 42 y.o.   MRN: [id number]  HPI  Patient here for a scheduled follow up.  She is seeing Dr Nicolasa Ducking for recurrent depression and anxiety.  On zoloft.  Taking melatonin to help her sleep.  She is sleeping better.  Overall feels she is doing ok on this regimen.  Still with increased stress. Discussed with her.  She is trying to get pregnant.  Undergoing w/up and evaluation.  Planning for f/u with [facility] for more work up.  Also increased stress at work.  Discussed with her today.  Does not feel needs any further intervention at this time.  No chest pain.  No increased cough or congestion.  No acid reflux.  No abdominal pain.  Bowels moving.     Past Medical History:  Diagnosis Date  . C. difficile colitis    augment use  . Depression   . Dysmenorrhea   . Environmental allergies    allergy shots  . History of frequent urinary tract infections   . Hypercholesterolemia    Past Surgical History:  Procedure Laterality Date  . NASAL SINUS SURGERY  2003   Family History  Problem Relation Age of Onset  . Hyperlipidemia Mother   . Asthma Father   . Hyperlipidemia Father   . Allergies Father   . Breast cancer Neg Hx    Social History   Socioeconomic History  . Marital status: Married    Spouse name: Not on file  . Number of children: 0  . Years of education: college  . Highest education level: Not on file  Occupational History  . Not on file  Social Needs  . Financial resource strain: Not on file  . Food insecurity:    Worry: Not on file    Inability: Not on file  . Transportation needs:    Medical: Not on file    Non-medical: Not on file  Tobacco Use  . Smoking status: Never Smoker  . Smokeless tobacco: Never Used  Substance and Sexual Activity  . Alcohol use: Yes    Alcohol/week: 0.0 oz    Comment: occasional    . Drug use: No  . Sexual activity: Yes    Birth control/protection: None  Lifestyle  . Physical activity:    Days per week: Not on file    Minutes per session: Not on file  . Stress: Not on file  Relationships  . Social connections:    Talks on phone: Not on file    Gets together: Not on file    Attends religious service: Not on file    Active member of club or organization: Not on file    Attends meetings of clubs or organizations: Not on file    Relationship status: Not on file  Other Topics Concern  . 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    Outpatient Encounter Medications as of 04/13/2017  Medication Sig  . sertraline (ZOLOFT) 100 MG tablet   . ciprofloxacin (CIPRO) 250 MG tablet Take 1 tablet (250 mg total) by mouth as needed.  Marland Kitchen EPINEPHrine 0.3 mg/0.3 mL IJ SOAJ injection   . esomeprazole (NEXIUM) 40 MG capsule TAKE 1 CAPSULE(40 MG) BY MOUTH TWICE DAILY BEFORE A MEAL  . Loratadine  10 MG CAPS Take by mouth.  . meloxicam (MOBIC) 15 MG tablet Take 1 tablet by mouth daily as needed.  . valACYclovir (VALTREX) 500 MG tablet TAKE 1 TABLET(500 MG) BY MOUTH TWICE DAILY  . [DISCONTINUED] clindamycin (CLEOCIN) 2 % vaginal cream Place 1 Applicatorful vaginally at bedtime.  . [DISCONTINUED] sertraline (ZOLOFT) 50 MG tablet Take 50 mg by mouth daily.   Facility-Administered Encounter Medications as of 04/13/2017  Medication  . betamethasone acetate-betamethasone sodium phosphate (CELESTONE) injection 3 mg  . betamethasone acetate-betamethasone sodium phosphate (CELESTONE) injection 3 mg    Review of Systems  Constitutional: Negative for appetite change and unexpected weight change.  HENT: Negative for congestion and sinus pressure.   Respiratory: Negative for cough, chest tightness and shortness of breath.   Cardiovascular: Negative for chest pain, palpitations and leg swelling.  Gastrointestinal: Negative for abdominal  pain, diarrhea, nausea and vomiting.  Genitourinary: Negative for difficulty urinating and dysuria.  Musculoskeletal: Negative for joint swelling and myalgias.  Skin: Negative for color change and rash.  Neurological: Negative for dizziness, light-headedness and headaches.  Psychiatric/Behavioral: Negative for agitation and dysphoric mood.       Objective:    Physical Exam  Constitutional: She appears well-developed and well-nourished. No distress.  HENT:  Nose: Nose normal.  Mouth/Throat: Oropharynx is clear and moist.  Neck: Neck supple. No thyromegaly present.  Cardiovascular: Normal rate and regular rhythm.  Pulmonary/Chest: Breath sounds normal. No respiratory distress. She has no wheezes.  Abdominal: Soft. Bowel sounds are normal. There is no tenderness.  Musculoskeletal: She exhibits no edema or tenderness.  Lymphadenopathy:    She has no cervical adenopathy.  Skin: No rash noted. No erythema.  Psychiatric: She has a normal mood and affect. Her behavior is normal.    BP 122/78 (BP Location: Left Arm, Patient Position: Sitting, Cuff Size: Normal)   Pulse 82   Temp 98 F (36.7 C) (Oral)   Resp 18   Wt 248 lb (112.5 kg)   SpO2 96%   BMI 35.50 kg/m  Wt Readings from Last 3 Encounters:  04/13/17 248 lb (112.5 kg)  12/08/16 250 lb 3.2 oz (113.5 kg)  08/22/16 246 lb (111.6 kg)     Lab Results  Component Value Date   WBC 8.3 03/28/2016   HGB 13.9 03/28/2016   HCT 42.7 03/28/2016   PLT 285.0 03/28/2016   GLUCOSE 91 11/25/2016   CHOL 221 (H) 11/25/2016   TRIG 120 11/25/2016   HDL 42 (L) 11/25/2016   LDLCALC 155 (H) 11/25/2016   ALT 11 11/25/2016   AST 16 11/25/2016   NA 138 11/25/2016   K 4.0 11/25/2016   CL 104 11/25/2016   CREATININE 0.72 11/25/2016   BUN 21 11/25/2016   CO2 25 11/25/2016   TSH 2.69 03/28/2016       Assessment & Plan:   Problem List Items Addressed This Visit    Abnormal Pap smear of cervix    Seeing gyn.  Had pap here 12/08/17 -   Negative with negative HPV.       Difficulty sleeping    Taking melatonin now.  Seeing Dr Nicolasa Ducking.  Sleeping better.        GERD (gastroesophageal reflux disease)    Controlled.        Hypercholesterolemia    Low cholesterol diet and exercise.  Follow lipid panel.        Stress    Increased stress.  Discussed with her today.  She is seeing Dr  Nicolasa Ducking.  Does not feel she needs any further intervention.  Follow.           Einar Pheasant, MD

## 2017-04-16 ENCOUNTER — Other Ambulatory Visit: Payer: Self-pay | Admitting: Internal Medicine

## 2017-04-16 ENCOUNTER — Encounter: Payer: Self-pay | Admitting: Internal Medicine

## 2017-04-16 NOTE — Assessment & Plan Note (Signed)
Taking melatonin now.  Seeing Dr Nicolasa Ducking.  Sleeping better.

## 2017-04-16 NOTE — Assessment & Plan Note (Signed)
Increased stress.  Discussed with her today.  She is seeing Dr Nicolasa Ducking.  Does not feel she needs any further intervention.  Follow.

## 2017-04-16 NOTE — Assessment & Plan Note (Signed)
Low cholesterol diet and exercise.  Follow lipid panel.   

## 2017-04-16 NOTE — Assessment & Plan Note (Signed)
Controlled.  

## 2017-04-16 NOTE — Assessment & Plan Note (Signed)
Seeing gyn.  Had pap here 12/08/17 -  Negative with negative HPV.

## 2017-04-17 ENCOUNTER — Ambulatory Visit
Admission: RE | Admit: 2017-04-17 | Discharge: 2017-04-17 | Disposition: A | Discharge status: Home / Self Care | Payer: BC Managed Care – PPO | Source: Ambulatory Visit | Attending: Internal Medicine | Admitting: Internal Medicine

## 2017-04-17 DIAGNOSIS — Z1231 Encounter for screening mammogram for malignant neoplasm of breast: Secondary | ICD-10-CM | POA: Insufficient documentation

## 2017-05-09 ENCOUNTER — Other Ambulatory Visit: Payer: Self-pay | Admitting: Internal Medicine

## 2017-05-09 ENCOUNTER — Other Ambulatory Visit: Payer: Self-pay | Admitting: Obstetrics and Gynecology

## 2017-05-09 DIAGNOSIS — B009 Herpesviral infection, unspecified: Secondary | ICD-10-CM

## 2017-05-18 ENCOUNTER — Encounter: Payer: Self-pay | Admitting: Internal Medicine

## 2017-09-03 ENCOUNTER — Other Ambulatory Visit: Payer: Self-pay

## 2017-09-03 DIAGNOSIS — B009 Herpesviral infection, unspecified: Secondary | ICD-10-CM

## 2017-09-06 ENCOUNTER — Other Ambulatory Visit: Payer: Self-pay

## 2017-09-06 DIAGNOSIS — B009 Herpesviral infection, unspecified: Secondary | ICD-10-CM

## 2017-09-07 ENCOUNTER — Other Ambulatory Visit: Payer: Self-pay

## 2017-09-07 DIAGNOSIS — B009 Herpesviral infection, unspecified: Secondary | ICD-10-CM

## 2017-09-12 DIAGNOSIS — D259 Leiomyoma of uterus, unspecified: Secondary | ICD-10-CM | POA: Insufficient documentation

## 2017-10-06 ENCOUNTER — Other Ambulatory Visit: Payer: Self-pay

## 2017-10-06 DIAGNOSIS — B009 Herpesviral infection, unspecified: Secondary | ICD-10-CM

## 2017-10-09 ENCOUNTER — Other Ambulatory Visit: Payer: Self-pay

## 2017-10-09 DIAGNOSIS — B009 Herpesviral infection, unspecified: Secondary | ICD-10-CM

## 2017-10-12 MED ORDER — VALACYCLOVIR HCL 500 MG PO TABS: ORAL_TABLET | ORAL | 0 refills | Status: DC

## 2017-10-17 ENCOUNTER — Encounter: Payer: Self-pay | Admitting: Internal Medicine

## 2017-10-17 ENCOUNTER — Ambulatory Visit: Payer: BC Managed Care – PPO | Admitting: Internal Medicine

## 2017-10-17 DIAGNOSIS — K219 Gastro-esophageal reflux disease without esophagitis: Secondary | ICD-10-CM | POA: Diagnosis not present

## 2017-10-17 DIAGNOSIS — R87619 Unspecified abnormal cytological findings in specimens from cervix uteri: Secondary | ICD-10-CM | POA: Diagnosis not present

## 2017-10-17 DIAGNOSIS — E7800 Pure hypercholesterolemia, unspecified: Secondary | ICD-10-CM | POA: Diagnosis not present

## 2017-10-17 DIAGNOSIS — Z23 Encounter for immunization: Secondary | ICD-10-CM

## 2017-10-17 DIAGNOSIS — Z9109 Other allergy status, other than to drugs and biological substances: Secondary | ICD-10-CM | POA: Diagnosis not present

## 2017-10-17 DIAGNOSIS — N926 Irregular menstruation, unspecified: Secondary | ICD-10-CM

## 2017-10-17 DIAGNOSIS — F439 Reaction to severe stress, unspecified: Secondary | ICD-10-CM

## 2017-10-17 DIAGNOSIS — R109 Unspecified abdominal pain: Secondary | ICD-10-CM

## 2017-10-17 NOTE — Progress Notes (Signed)
Patient ID: Melissa Higgins, female   DOB: 10/18/1975, 42 y.o.   MRN: [id number]   Subjective:    Patient ID: Melissa Higgins, female    DOB: May 30, 1975, 42 y.o.   MRN: [id number]  HPI  Patient here for a scheduled follow up.  She reports she is doing relatively well. She is s/p lap myomectomy/excision endometriosis.  Doing well.  Last period 09/25/17-09/29/17.  Discussed diet and exercise.  No chest pain.  No sob.  No acid reflux.  Has noticed some soreness above her belly button.  Tender with palpation.  Just noticed recently.  No injury.  Bowels moving.  No urine change.  No nausea or vomiting.     Past Medical History:  Diagnosis Date  . C. difficile colitis    augment use  . Depression   . Dysmenorrhea   . Environmental allergies    allergy shots  . History of frequent urinary tract infections   . Hypercholesterolemia    Past Surgical History:  Procedure Laterality Date  . NASAL SINUS SURGERY  2003   Family History  Problem Relation Age of Onset  . Hyperlipidemia Mother   . Asthma Father   . Hyperlipidemia Father   . Allergies Father   . Breast cancer Neg Hx    Social History   Socioeconomic History  . Marital status: Married    Spouse name: Not on file  . Number of children: 0  . Years of education: college  . Highest education level: Not on file  Occupational History  . Not on file  Social Needs  . Financial resource strain: Not on file  . Food insecurity:    Worry: Not on file    Inability: Not on file  . Transportation needs:    Medical: Not on file    Non-medical: Not on file  Tobacco Use  . Smoking status: Never Smoker  . Smokeless tobacco: Never Used  Substance and Sexual Activity  . Alcohol use: Yes    Alcohol/week: 0.0 standard drinks    Comment: occasional  . Drug use: No  . Sexual activity: Yes    Birth control/protection: None  Lifestyle  . Physical activity:    Days per week: Not on file    Minutes per session: Not on file  . Stress: Not  on file  Relationships  . Social connections:    Talks on phone: Not on file    Gets together: Not on file    Attends religious service: Not on file    Active member of club or organization: Not on file    Attends meetings of clubs or organizations: Not on file    Relationship status: Not on file  Other Topics Concern  . 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    Outpatient Encounter Medications as of 10/17/2017  Medication Sig  . EPINEPHrine 0.3 mg/0.3 mL IJ SOAJ injection   . esomeprazole (NEXIUM) 40 MG capsule TAKE 1 CAPSULE(40 MG) BY MOUTH TWICE DAILY BEFORE MEALS  . Loratadine 10 MG CAPS Take by mouth.  . sertraline (ZOLOFT) 100 MG tablet   . valACYclovir (VALTREX) 500 MG tablet TAKE 1 TABLET(500 MG) BY MOUTH TWICE DAILY  . [DISCONTINUED] meloxicam (MOBIC) 15 MG tablet Take 1 tablet by mouth daily as needed.  . [DISCONTINUED] ciprofloxacin (CIPRO) 250 MG tablet Take 1 tablet (250 mg total) by mouth as needed. (Patient not taking: Reported  on 10/17/2017)   Facility-Administered Encounter Medications as of 10/17/2017  Medication  . betamethasone acetate-betamethasone sodium phosphate (CELESTONE) injection 3 mg  . betamethasone acetate-betamethasone sodium phosphate (CELESTONE) injection 3 mg    Review of Systems  Constitutional: Negative for fever and unexpected weight change.  HENT: Negative for congestion and sinus pressure.   Respiratory: Negative for cough, chest tightness and shortness of breath.   Cardiovascular: Negative for chest pain, palpitations and leg swelling.  Gastrointestinal: Negative for diarrhea, nausea and vomiting.       Soreness - above umbilicus.    Genitourinary: Negative for difficulty urinating and dysuria.  Musculoskeletal: Negative for joint swelling and myalgias.  Skin: Negative for color change and rash.  Neurological: Negative for dizziness, light-headedness and headaches.    Psychiatric/Behavioral: Negative for agitation and dysphoric mood.       Handling stress.         Objective:    Physical Exam  Constitutional: She appears well-developed and well-nourished. No distress.  HENT:  Nose: Nose normal.  Mouth/Throat: Oropharynx is clear and moist.  Neck: Neck supple. No thyromegaly present.  Cardiovascular: Normal rate and regular rhythm.  Pulmonary/Chest: Breath sounds normal. No respiratory distress. She has no wheezes.  Abdominal: Soft. Bowel sounds are normal.  One localized spot above umbilicus - minimal tenderness to palpation.  No other abdominal pain to palpation.     Musculoskeletal: She exhibits no edema or tenderness.  Lymphadenopathy:    She has no cervical adenopathy.  Skin: No rash noted. No erythema.  Psychiatric: She has a normal mood and affect. Her behavior is normal.    BP 122/84 (BP Location: Left Arm, Patient Position: Sitting, Cuff Size: Large)   Pulse 81   Temp 98.4 F (36.9 C) (Oral)   Resp 16   Ht 5\' 10"  (1.778 m)   Wt 247 lb (112 kg)   SpO2 97%   BMI 35.44 kg/m  Wt Readings from Last 3 Encounters:  10/17/17 247 lb (112 kg)  04/13/17 248 lb (112.5 kg)  12/08/16 250 lb 3.2 oz (113.5 kg)     Lab Results  Component Value Date   WBC 8.3 03/28/2016   HGB 13.9 03/28/2016   HCT 42.7 03/28/2016   PLT 285.0 03/28/2016   GLUCOSE 91 11/25/2016   CHOL 221 (H) 11/25/2016   TRIG 120 11/25/2016   HDL 42 (L) 11/25/2016   LDLCALC 155 (H) 11/25/2016   ALT 11 11/25/2016   AST 16 11/25/2016   NA 138 11/25/2016   K 4.0 11/25/2016   CL 104 11/25/2016   CREATININE 0.72 11/25/2016   BUN 21 11/25/2016   CO2 25 11/25/2016   TSH 2.69 03/28/2016    Mm Digital Screening Bilateral  Result Date: 04/18/2017 CLINICAL DATA:  Screening. EXAM: DIGITAL SCREENING BILATERAL MAMMOGRAM WITH CAD COMPARISON:  Previous exam(s). ACR Breast Density Category c: The breast tissue is heterogeneously dense, which may obscure small masses. FINDINGS:  There are no findings suspicious for malignancy. Images were processed with CAD. IMPRESSION: No mammographic evidence of malignancy. A result letter of this screening mammogram will be mailed directly to the patient. RECOMMENDATION: Screening mammogram in one year. (Code:SM-B-01Y) BI-RADS CATEGORY  1: Negative. Electronically Signed   By: Fidela Salisbury M.D.   On: 04/18/2017 09:14       Assessment & Plan:   Problem List Items Addressed This Visit    Abdominal pain    Tender to palpation - one spot above the umbilicus.  No mass.  No other tenderness.  No associated symptoms.  Question small hernia.  Discussed further evaluation.  She wants to monitor.  Notify me if persistent.        Abnormal Pap smear of cervix    Seeing gyn.  PAP here 02/07/17 - negative with negative HPV.        Environmental allergies    Controlled.        GERD (gastroesophageal reflux disease)    Controlled on current regimen.  Follow.        Hypercholesterolemia    Low cholesterol diet and exercise.  Follow lipid panel.        Menstrual irregularity    S/p lap myomectomy.  Doing well.  Follow.        Stress    Increased stress.  Overall doing better. Has been seeing Dr Nicolasa Ducking.  Feels things are stable.  Would like to get her medications refilled through our office since stable.  Dr Nicolasa Ducking notified.         Other Visit Diagnoses    Need for immunization against influenza       Relevant Orders   Flu Vaccine QUAD 36+ mos IM (Completed)       Einar Pheasant, MD

## 2017-10-22 ENCOUNTER — Encounter: Payer: Self-pay | Admitting: Internal Medicine

## 2017-10-22 DIAGNOSIS — R109 Unspecified abdominal pain: Secondary | ICD-10-CM | POA: Insufficient documentation

## 2017-10-22 NOTE — Assessment & Plan Note (Signed)
Controlled on current regimen.  Follow.  

## 2017-10-22 NOTE — Assessment & Plan Note (Signed)
Seeing gyn.  PAP here 02/07/17 - negative with negative HPV.

## 2017-10-22 NOTE — Assessment & Plan Note (Signed)
S/p lap myomectomy.  Doing well.  Follow.

## 2017-10-22 NOTE — Assessment & Plan Note (Signed)
Controlled.  

## 2017-10-22 NOTE — Assessment & Plan Note (Signed)
Increased stress.  Overall doing better. Has been seeing Dr Nicolasa Ducking.  Feels things are stable.  Would like to get her medications refilled through our office since stable.  Dr Nicolasa Ducking notified.

## 2017-10-22 NOTE — Assessment & Plan Note (Signed)
Low cholesterol diet and exercise.  Follow lipid panel.   

## 2017-10-22 NOTE — Assessment & Plan Note (Signed)
Tender to palpation - one spot above the umbilicus.  No mass.  No other tenderness.  No associated symptoms.  Question small hernia.  Discussed further evaluation.  She wants to monitor.  Notify me if persistent.

## 2017-10-25 ENCOUNTER — Other Ambulatory Visit: Payer: Self-pay | Admitting: Internal Medicine

## 2017-10-26 ENCOUNTER — Encounter: Payer: Self-pay | Admitting: Internal Medicine

## 2017-10-27 NOTE — Telephone Encounter (Signed)
LMTCB

## 2017-10-27 NOTE — Telephone Encounter (Signed)
See if she can come in and see me on 11/02/17 at 11:00 - to be evaluated.

## 2017-10-27 NOTE — Telephone Encounter (Signed)
Pt scheduled  

## 2017-11-02 ENCOUNTER — Ambulatory Visit: Payer: BC Managed Care – PPO | Admitting: Internal Medicine

## 2017-11-02 ENCOUNTER — Encounter: Payer: Self-pay | Admitting: Internal Medicine

## 2017-11-02 ENCOUNTER — Other Ambulatory Visit (HOSPITAL_COMMUNITY)
Admission: RE | Admit: 2017-11-02 | Discharge: 2017-11-02 | Disposition: A | Discharge status: Home / Self Care | Payer: BC Managed Care – PPO | Source: Ambulatory Visit | Attending: Internal Medicine | Admitting: Internal Medicine

## 2017-11-02 VITALS — BP 124/80 | HR 86 | Temp 97.4°F | Resp 18 | Wt 247.0 lb

## 2017-11-02 DIAGNOSIS — F439 Reaction to severe stress, unspecified: Secondary | ICD-10-CM | POA: Diagnosis not present

## 2017-11-02 DIAGNOSIS — Z7689 Persons encountering health services in other specified circumstances: Secondary | ICD-10-CM | POA: Diagnosis present

## 2017-11-02 DIAGNOSIS — B009 Herpesviral infection, unspecified: Secondary | ICD-10-CM

## 2017-11-02 DIAGNOSIS — R350 Frequency of micturition: Secondary | ICD-10-CM | POA: Diagnosis not present

## 2017-11-02 LAB — URINALYSIS, ROUTINE W REFLEX MICROSCOPIC
BILIRUBIN URINE: NEGATIVE
HGB URINE DIPSTICK: NEGATIVE
KETONES UR: NEGATIVE
LEUKOCYTES UA: NEGATIVE
Nitrite: NEGATIVE
PH: 5.5 (ref 5.0–8.0)
RBC / HPF: NONE SEEN (ref 0–?)
SPECIFIC GRAVITY, URINE: 1.025 (ref 1.000–1.030)
TOTAL PROTEIN, URINE-UPE24: NEGATIVE
URINE GLUCOSE: NEGATIVE
UROBILINOGEN UA: 0.2 (ref 0.0–1.0)

## 2017-11-02 MED ORDER — SERTRALINE HCL 100 MG PO TABS: 100.0000 mg | ORAL_TABLET | Freq: Every day | ORAL | 1 refills | Status: DC

## 2017-11-02 MED ORDER — MUPIROCIN 2 % EX OINT: 1.0000 "application " | TOPICAL_OINTMENT | Freq: Two times a day (BID) | CUTANEOUS | 0 refills | Status: DC

## 2017-11-02 NOTE — Progress Notes (Signed)
Patient ID: Melissa Higgins, female   DOB: June 14, 1975, 42 y.o.   MRN: [id number]   Subjective:    Patient ID: Melissa Higgins, female    DOB: [birth year], 42 y.o.   MRN: [id number]  HPI  Patient here as a work in to be checked for STDs.  She had called in concerned regarding some issues with her husband and wants to be checked.  They have talked.  Things are better.  Planning counseling.  She is overall doing better.  Reports some increased urinary frequency, but otherwise reports no symptoms.  No vaginal discharge or itching.  No lesions.  No abdominal pain.  Bowels moving.  Handling stress.     Past Medical History:  Diagnosis Date  . C. difficile colitis    augment use  . Depression   . Dysmenorrhea   . Environmental allergies    allergy shots  . History of frequent urinary tract infections   . Hypercholesterolemia    Past Surgical History:  Procedure Laterality Date  . NASAL SINUS SURGERY  2003   Family History  Problem Relation Age of Onset  . Hyperlipidemia Mother   . Asthma Father   . Hyperlipidemia Father   . Allergies Father   . Breast cancer Neg Hx    Social History   Socioeconomic History  . Marital status: Married    Spouse name: Not on file  . Number of children: 0  . Years of education: college  . Highest education level: Not on file  Occupational History  . Not on file  Social Needs  . Financial resource strain: Not on file  . Food insecurity:    Worry: Not on file    Inability: Not on file  . Transportation needs:    Medical: Not on file    Non-medical: Not on file  Tobacco Use  . Smoking status: Never Smoker  . Smokeless tobacco: Never Used  Substance and Sexual Activity  . Alcohol use: Yes    Alcohol/week: 0.0 standard drinks    Comment: occasional  . Drug use: No  . Sexual activity: Yes    Birth control/protection: None  Lifestyle  . Physical activity:    Days per week: Not on file    Minutes per session: Not on file  . Stress: Not on  file  Relationships  . Social connections:    Talks on phone: Not on file    Gets together: Not on file    Attends religious service: Not on file    Active member of club or organization: Not on file    Attends meetings of clubs or organizations: Not on file    Relationship status: Not on file  Other Topics Concern  . 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    Outpatient Encounter Medications as of 11/02/2017  Medication Sig  . EPINEPHrine 0.3 mg/0.3 mL IJ SOAJ injection   . esomeprazole (NEXIUM) 40 MG capsule TAKE ONE CAPSULE BY MOUTH TWICE DAILY BEFORE MEALS  . Loratadine 10 MG CAPS Take by mouth.  . mupirocin ointment (BACTROBAN) 2 % Place 1 application into the nose 2 (two) times daily.  . sertraline (ZOLOFT) 100 MG tablet Take 1 tablet (100 mg total) by mouth daily.  . valACYclovir (VALTREX) 500 MG tablet TAKE 1 TABLET(500 MG) BY MOUTH TWICE DAILY  . [DISCONTINUED] sertraline (ZOLOFT) 100 MG tablet    Facility-Administered Encounter Medications as  of 11/02/2017  Medication  . betamethasone acetate-betamethasone sodium phosphate (CELESTONE) injection 3 mg  . betamethasone acetate-betamethasone sodium phosphate (CELESTONE) injection 3 mg    Review of Systems  Constitutional: Negative for appetite change and unexpected weight change.  HENT: Negative for congestion and sinus pressure.   Respiratory: Negative for cough.   Cardiovascular: Negative for leg swelling.  Gastrointestinal: Negative for abdominal pain, diarrhea, nausea and vomiting.  Genitourinary: Positive for frequency. Negative for dysuria, vaginal discharge and vaginal pain.  Musculoskeletal: Negative for joint swelling and myalgias.  Skin: Negative for color change and rash.  Neurological: Negative for light-headedness and headaches.  Psychiatric/Behavioral: Negative for agitation and dysphoric mood.       Objective:    Physical Exam    Constitutional: She appears well-developed and well-nourished. No distress.  Cardiovascular: Normal rate and regular rhythm.  Pulmonary/Chest: Breath sounds normal. No respiratory distress. She has no wheezes.  Abdominal: Soft. Bowel sounds are normal. There is no tenderness.  Genitourinary:  Genitourinary Comments: Normal external genitalia.  Vaginal vault without lesions.  Wet prep obtained.   Could not appreciate any adnexal masses or tenderness.   KOH/Wet prep sent.    Musculoskeletal: She exhibits no edema or tenderness.  Back:  No CVA tenderness.    Skin: No rash noted. No erythema.    BP 124/80 (BP Location: Left Arm, Patient Position: Sitting, Cuff Size: Large)   Pulse 86   Temp (!) 97.4 F (36.3 C) (Oral)   Resp 18   Wt 247 lb (112 kg)   SpO2 97%   BMI 35.44 kg/m  Wt Readings from Last 3 Encounters:  11/02/17 247 lb (112 kg)  10/17/17 247 lb (112 kg)  04/13/17 248 lb (112.5 kg)     Lab Results  Component Value Date   WBC 8.3 03/28/2016   HGB 13.9 03/28/2016   HCT 42.7 03/28/2016   PLT 285.0 03/28/2016   GLUCOSE 91 11/25/2016   CHOL 221 (H) 11/25/2016   TRIG 120 11/25/2016   HDL 42 (L) 11/25/2016   LDLCALC 155 (H) 11/25/2016   ALT 11 11/25/2016   AST 16 11/25/2016   NA 138 11/25/2016   K 4.0 11/25/2016   CL 104 11/25/2016   CREATININE 0.72 11/25/2016   BUN 21 11/25/2016   CO2 25 11/25/2016   TSH 2.69 03/28/2016    Mm Digital Screening Bilateral  Result Date: 04/18/2017 CLINICAL DATA:  Screening. EXAM: DIGITAL SCREENING BILATERAL MAMMOGRAM WITH CAD COMPARISON:  Previous exam(s). ACR Breast Density Category c: The breast tissue is heterogeneously dense, which may obscure small masses. FINDINGS: There are no findings suspicious for malignancy. Images were processed with CAD. IMPRESSION: No mammographic evidence of malignancy. A result letter of this screening mammogram will be mailed directly to the patient. RECOMMENDATION: Screening mammogram in one year.  (Code:SM-B-01Y) BI-RADS CATEGORY  1: Negative. Electronically Signed   By: Fidela Salisbury M.D.   On: 04/18/2017 09:14       Assessment & Plan:   Problem List Items Addressed This Visit    Herpes simplex type II infection (Chronic)    On suppressive antiviral medication.        Relevant Medications   mupirocin ointment (BACTROBAN) 2 %   Stress    Increased stress recently as outlined.  Discussed with her today.  Her and her husband have talked.  She is feeling better.  Planning counseling.  Follow.  Continue zoloft.         Other Visit Diagnoses  Encounter for assessment of STD exposure    -  Primary   Concern over possible exposure.  Discussed testing.  Check HIV, syphilis, GC chlamydia, hepatitis and trichomonas.     Relevant Orders   HIV antibody (with reflex) (Completed)   Hepatitis B surface antigen (Completed)   Hepatitis B surface antibody,qualitative (Completed)   Hepatitis C antibody (Completed)   Hepatitis B e antigen (Completed)   RPR (Completed)   VDRL, Serum (Completed)   Cervicovaginal ancillary only( [place]) (Completed)   Cervicovaginal ancillary only( [place]) (Completed)   Urinary frequency       Relevant Orders   Urinalysis, Routine w reflex microscopic (Completed)   Urine Culture (Completed)       Einar Pheasant, MD

## 2017-11-03 LAB — CERVICOVAGINAL ANCILLARY ONLY
CHLAMYDIA, DNA PROBE: NEGATIVE
Neisseria Gonorrhea: NEGATIVE
WET PREP (BD AFFIRM): POSITIVE — AB

## 2017-11-03 LAB — HEPATITIS B E ANTIGEN: Hep B E Ag: NONREACTIVE

## 2017-11-03 LAB — HEPATITIS C ANTIBODY
HEP C AB: NONREACTIVE
SIGNAL TO CUT-OFF: 0.02 (ref <1.00)

## 2017-11-03 LAB — HEPATITIS B SURFACE ANTIBODY,QUALITATIVE: HEP B S AB: NONREACTIVE

## 2017-11-03 LAB — HEPATITIS B SURFACE ANTIGEN: HEP B S AG: NONREACTIVE

## 2017-11-04 LAB — URINE CULTURE
MICRO NUMBER:: 91280673
SPECIMEN QUALITY: ADEQUATE

## 2017-11-04 LAB — RPR: RPR Ser Ql: NONREACTIVE

## 2017-11-04 LAB — HIV ANTIBODY (ROUTINE TESTING W REFLEX): HIV: NONREACTIVE

## 2017-11-04 LAB — VDRL: VDRL, SERUM: NONREACTIVE

## 2017-11-05 ENCOUNTER — Encounter: Payer: Self-pay | Admitting: Internal Medicine

## 2017-11-05 NOTE — Assessment & Plan Note (Signed)
On suppressive antiviral medication.

## 2017-11-05 NOTE — Assessment & Plan Note (Signed)
Increased stress recently as outlined.  Discussed with her today.  Her and her husband have talked.  She is feeling better.  Planning counseling.  Follow.  Continue zoloft.

## 2017-11-06 ENCOUNTER — Encounter: Payer: Self-pay | Admitting: Internal Medicine

## 2017-11-06 MED ORDER — METRONIDAZOLE 0.75 % VA GEL: 1.0000 | Freq: Two times a day (BID) | VAGINAL | 0 refills | Status: DC

## 2017-11-06 NOTE — Telephone Encounter (Signed)
rx sent in for metrogel vaginal.

## 2017-12-21 ENCOUNTER — Other Ambulatory Visit: Payer: Self-pay

## 2017-12-21 DIAGNOSIS — B009 Herpesviral infection, unspecified: Secondary | ICD-10-CM

## 2017-12-22 ENCOUNTER — Other Ambulatory Visit: Payer: Self-pay

## 2017-12-22 DIAGNOSIS — B009 Herpesviral infection, unspecified: Secondary | ICD-10-CM

## 2017-12-22 MED ORDER — VALACYCLOVIR HCL 500 MG PO TABS: ORAL_TABLET | ORAL | 0 refills | Status: DC

## 2018-01-23 ENCOUNTER — Other Ambulatory Visit: Payer: Self-pay | Admitting: Internal Medicine

## 2018-02-19 ENCOUNTER — Other Ambulatory Visit (HOSPITAL_COMMUNITY)
Admission: RE | Admit: 2018-02-19 | Discharge: 2018-02-19 | Disposition: A | Discharge status: Home / Self Care | Payer: BC Managed Care – PPO | Source: Ambulatory Visit | Attending: Internal Medicine | Admitting: Internal Medicine

## 2018-02-19 ENCOUNTER — Ambulatory Visit (INDEPENDENT_AMBULATORY_CARE_PROVIDER_SITE_OTHER): Payer: BC Managed Care – PPO | Admitting: Internal Medicine

## 2018-02-19 ENCOUNTER — Other Ambulatory Visit: Payer: Self-pay | Admitting: Obstetrics and Gynecology

## 2018-02-19 ENCOUNTER — Encounter: Payer: Self-pay | Admitting: Internal Medicine

## 2018-02-19 VITALS — BP 108/68 | HR 84 | Temp 97.9°F | Resp 16 | Ht 70.0 in | Wt 240.6 lb

## 2018-02-19 DIAGNOSIS — R0683 Snoring: Secondary | ICD-10-CM

## 2018-02-19 DIAGNOSIS — Z9109 Other allergy status, other than to drugs and biological substances: Secondary | ICD-10-CM | POA: Diagnosis not present

## 2018-02-19 DIAGNOSIS — B009 Herpesviral infection, unspecified: Secondary | ICD-10-CM

## 2018-02-19 DIAGNOSIS — Z124 Encounter for screening for malignant neoplasm of cervix: Secondary | ICD-10-CM | POA: Insufficient documentation

## 2018-02-19 DIAGNOSIS — F439 Reaction to severe stress, unspecified: Secondary | ICD-10-CM

## 2018-02-19 DIAGNOSIS — Z0000 Encounter for general adult medical examination without abnormal findings: Secondary | ICD-10-CM | POA: Diagnosis not present

## 2018-02-19 DIAGNOSIS — K219 Gastro-esophageal reflux disease without esophagitis: Secondary | ICD-10-CM | POA: Diagnosis not present

## 2018-02-19 DIAGNOSIS — E7800 Pure hypercholesterolemia, unspecified: Secondary | ICD-10-CM

## 2018-02-19 MED ORDER — TRIAMCINOLONE ACETONIDE 55 MCG/ACT NA AERO: 2.0000 | INHALATION_SPRAY | Freq: Every day | NASAL | 1 refills | Status: DC

## 2018-02-19 NOTE — Assessment & Plan Note (Signed)
Controlled on current regimen.   

## 2018-02-19 NOTE — Progress Notes (Signed)
Patient ID: Melissa Higgins, female   DOB: Oct 11, 1975, 43 y.o.   MRN: [id number]   Subjective:    Patient ID: Melissa Higgins, female    DOB: [birth year], 43 y.o.   MRN: [id number]  HPI  Patient here for her physical exam.  Increased stress.  Separated from her husband.  Discussed with her today.  Overall she feels she is handling things relatively well.  Seeing a counselor every two weeks.  Does not feel she needs anything more at this time.  Some increased drainage.  Headache and possible sinus pressure.  No earache.  Minimal sore throat.  Took sudafed.  Helps some.  No chest congestion.  No sob.  No acid reflux.  No abdominal pain.  Bowels moving.  Reports increased snoring.  Daytime somnolence.  Does not feel rested when wakes up.     Past Medical History:  Diagnosis Date  . C. difficile colitis    augment use  . Depression   . Dysmenorrhea   . Environmental allergies    allergy shots  . History of frequent urinary tract infections   . Hypercholesterolemia    Past Surgical History:  Procedure Laterality Date  . NASAL SINUS SURGERY  2003   Family History  Problem Relation Age of Onset  . Hyperlipidemia Mother   . Asthma Father   . Hyperlipidemia Father   . Allergies Father   . Breast cancer Neg Hx    Social History   Socioeconomic History  . Marital status: Married    Spouse name: Not on file  . Number of children: 0  . Years of education: college  . Highest education level: Not on file  Occupational History  . Not on file  Social Needs  . Financial resource strain: Not on file  . Food insecurity:    Worry: Not on file    Inability: Not on file  . Transportation needs:    Medical: Not on file    Non-medical: Not on file  Tobacco Use  . Smoking status: Never Smoker  . Smokeless tobacco: Never Used  Substance and Sexual Activity  . Alcohol use: Yes    Alcohol/week: 0.0 standard drinks    Comment: occasional  . Drug use: No  . Sexual activity: Yes    Birth  control/protection: None  Lifestyle  . Physical activity:    Days per week: Not on file    Minutes per session: Not on file  . Stress: Not on file  Relationships  . Social connections:    Talks on phone: Not on file    Gets together: Not on file    Attends religious service: Not on file    Active member of club or organization: Not on file    Attends meetings of clubs or organizations: Not on file    Relationship status: Not on file  Other Topics Concern  . 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    Outpatient Encounter Medications as of 02/19/2018  Medication Sig  . EPINEPHrine 0.3 mg/0.3 mL IJ SOAJ injection   . esomeprazole (NEXIUM) 40 MG capsule TAKE ONE CAPSULE BY MOUTH TWICE DAILY BEFORE MEALS  . Loratadine 10 MG CAPS Take by mouth.  . mupirocin ointment (BACTROBAN) 2 % Place 1 application into the nose 2 (two) times daily.  . sertraline (ZOLOFT) 100 MG tablet Take 1 tablet (100 mg total) by mouth daily.  Marland Kitchen  triamcinolone (NASACORT) 55 MCG/ACT AERO nasal inhaler Place 2 sprays into the nose daily. Do this in the evening.  . valACYclovir (VALTREX) 500 MG tablet TAKE 1 TABLET(500 MG) BY MOUTH TWICE DAILY  . [DISCONTINUED] metroNIDAZOLE (METROGEL VAGINAL) 0.75 % vaginal gel Place 1 Applicatorful vaginally 2 (two) times daily. One applicator bid x 5 days.   Facility-Administered Encounter Medications as of 02/19/2018  Medication  . betamethasone acetate-betamethasone sodium phosphate (CELESTONE) injection 3 mg  . betamethasone acetate-betamethasone sodium phosphate (CELESTONE) injection 3 mg    Review of Systems  Constitutional: Positive for fatigue. Negative for appetite change and unexpected weight change.  HENT: Positive for congestion, postnasal drip and sinus pressure.   Respiratory: Negative for cough, chest tightness and shortness of breath.   Cardiovascular: Negative for chest pain, palpitations and leg  swelling.  Gastrointestinal: Negative for abdominal pain, diarrhea, nausea and vomiting.  Genitourinary: Negative for difficulty urinating and dysuria.  Musculoskeletal: Negative for joint swelling and myalgias.  Skin: Negative for color change and rash.  Neurological: Negative for dizziness, light-headedness and headaches.  Psychiatric/Behavioral: Negative for agitation and dysphoric mood.       Objective:    Physical Exam Constitutional:      General: She is not in acute distress.    Appearance: Normal appearance. She is well-developed.  HENT:     Nose: Nose normal. No congestion.     Mouth/Throat:     Pharynx: No oropharyngeal exudate or posterior oropharyngeal erythema.  Eyes:     General: No scleral icterus.       Right eye: No discharge.        Left eye: No discharge.  Neck:     Musculoskeletal: Neck supple. No muscular tenderness.     Thyroid: No thyromegaly.  Cardiovascular:     Rate and Rhythm: Normal rate and regular rhythm.  Pulmonary:     Effort: No tachypnea, accessory muscle usage or respiratory distress.     Breath sounds: Normal breath sounds. No decreased breath sounds or wheezing.  Chest:     Breasts:        Right: No inverted nipple, mass, nipple discharge or tenderness (no axillary adenopathy).        Left: No inverted nipple, mass, nipple discharge or tenderness (no axilarry adenopathy).  Abdominal:     General: Bowel sounds are normal.     Palpations: Abdomen is soft.     Tenderness: There is no abdominal tenderness.  Genitourinary:    Comments: Normal external genitalia.  Vaginal vault without lesions.  Cervix identified.  Pap smear performed.  Could not appreciate any adnexal masses or tenderness.   Musculoskeletal:        General: No swelling or tenderness.  Lymphadenopathy:     Cervical: No cervical adenopathy.  Skin:    Findings: No erythema or rash.  Neurological:     Mental Status: She is alert and oriented to person, place, and time.    Psychiatric:        Mood and Affect: Mood normal.        Behavior: Behavior normal.     BP 108/68 (BP Location: Left Arm, Patient Position: Sitting, Cuff Size: Normal)   Pulse 84   Temp 97.9 F (36.6 C) (Oral)   Resp 16   Ht 5\' 10"  (1.778 m)   Wt 240 lb 9.6 oz (109.1 kg)   SpO2 97%   BMI 34.52 kg/m  Wt Readings from Last 3 Encounters:  02/19/18 240 lb 9.6 oz (  109.1 kg)  11/02/17 247 lb (112 kg)  10/17/17 247 lb (112 kg)     Lab Results  Component Value Date   WBC 8.3 03/28/2016   HGB 13.9 03/28/2016   HCT 42.7 03/28/2016   PLT 285.0 03/28/2016   GLUCOSE 91 11/25/2016   CHOL 221 (H) 11/25/2016   TRIG 120 11/25/2016   HDL 42 (L) 11/25/2016   LDLCALC 155 (H) 11/25/2016   ALT 11 11/25/2016   AST 16 11/25/2016   NA 138 11/25/2016   K 4.0 11/25/2016   CL 104 11/25/2016   CREATININE 0.72 11/25/2016   BUN 21 11/25/2016   CO2 25 11/25/2016   TSH 2.69 03/28/2016       Assessment & Plan:   Problem List Items Addressed This Visit    Environmental allergies    With increased congestion and drainage.  nasacort nasal spray as directed.  Saline nasal spray.  Continue using humidifier.  Follow.        GERD (gastroesophageal reflux disease)    Controlled on current regimen.        Health care maintenance    Physical today 02/19/18.  Mammogram 04/18/17 - Birads I.  PAP 02/19/18.        Hypercholesterolemia    Low cholesterol diet and exercise.  Follow lipid panel.        Relevant Orders   CBC with Differential/Platelet   Hepatic function panel   Lipid panel   TSH   Basic metabolic panel   Snoring    Increased snoring and daytime somnolence.  Does not feel rested when wakes up.  Will refer to pulmonary for further evaluation for possible sleep apnea.        Relevant Orders   Ambulatory referral to Pulmonology   Stress    Increased stress as outlined.  Discussed with her today.  She is seeing a counselor regularly.  Does not feel needs any further intervention.   Follow.         Other Visit Diagnoses    Routine general medical examination at a health care facility    -  Primary   Screening for cervical cancer       Relevant Orders   Cytology - PAP( [place])       Einar Pheasant, MD

## 2018-02-19 NOTE — Assessment & Plan Note (Signed)
Increased snoring and daytime somnolence.  Does not feel rested when wakes up.  Will refer to pulmonary for further evaluation for possible sleep apnea.

## 2018-02-19 NOTE — Assessment & Plan Note (Signed)
Increased stress as outlined.  Discussed with her today.  She is seeing a counselor regularly.  Does not feel needs any further intervention.  Follow.

## 2018-02-19 NOTE — Assessment & Plan Note (Signed)
With increased congestion and drainage.  nasacort nasal spray as directed.  Saline nasal spray.  Continue using humidifier.  Follow.

## 2018-02-19 NOTE — Assessment & Plan Note (Signed)
Low cholesterol diet and exercise.  Follow lipid panel.   

## 2018-02-19 NOTE — Assessment & Plan Note (Addendum)
Physical today 02/19/18.  Mammogram 04/18/17 - Birads I.  PAP 02/19/18.

## 2018-02-22 LAB — CYTOLOGY - PAP
DIAGNOSIS: NEGATIVE
HPV: NOT DETECTED

## 2018-02-23 ENCOUNTER — Other Ambulatory Visit: Payer: Self-pay

## 2018-02-23 MED ORDER — CLINDAMYCIN PHOSPHATE 2 % VA CREA: 1.0000 | TOPICAL_CREAM | Freq: Every day | VAGINAL | 0 refills | Status: DC

## 2018-03-08 ENCOUNTER — Ambulatory Visit (INDEPENDENT_AMBULATORY_CARE_PROVIDER_SITE_OTHER): Payer: BC Managed Care – PPO | Admitting: Internal Medicine

## 2018-03-08 ENCOUNTER — Encounter: Payer: Self-pay | Admitting: Internal Medicine

## 2018-03-08 VITALS — BP 102/78 | Resp 16 | Ht 70.0 in | Wt 242.0 lb

## 2018-03-08 DIAGNOSIS — G4719 Other hypersomnia: Secondary | ICD-10-CM | POA: Diagnosis not present

## 2018-03-08 NOTE — Progress Notes (Signed)
[facility] Consultation      Assessment and Plan:  Excessive daytime sleepiness. - Symptoms and signs of obstructive sleep apnea. - We will send for sleep study.  Insomnia. - Appears to be well controlled with melatonin. - May be contributed to by sleep apnea.  Orders Placed This Encounter  Procedures  . Home sleep test   Return in about 3 months (around 06/06/2018).    Date: 03/08/2018  MRN# [id number] Melissa Higgins Jan 30, 1975   Melissa Higgins is a 43 y.o. old female seen in consultation for chief complaint of:    Chief Complaint  Patient presents with  . Consult    referred by Dr. Nicki Reaper for eval of OSA  . excessive daytime fatigue    regardless of amount of rest patient still tired during the day.    HPI:   She has been seen by sleep doc in the past and was started on some meds back then for sleep issues, no previous sleep study.   goes to bed at 830, reads for 30 min, then goes to sleep at 9 pm. Melatonin 30 min before bedtime.  Then will sleep for several hours, will wake a few times to urinate. Alarm goes off at 4:30 am to go to gym, then off to work.  She works at [employer]. She has been told that she snores.  On weekends she sleeps more up to 10 hours.  During the days she feels tired, when wakes in am does not feel rested. This has been going on for a few years.  She wears a fitbit and she gets about 8.5 hours per night.   Denies sleep paralysis, sleep walking, cataplexy. Denies jaw pain, may have TMJ, no dentures.   PMHX:   Past Medical History:  Diagnosis Date  . C. difficile colitis    augment use  . Depression   . Dysmenorrhea   . Environmental allergies    allergy shots  . History of frequent urinary tract infections   . Hypercholesterolemia    Surgical Hx:  Past Surgical History:  Procedure Laterality Date  . NASAL SINUS SURGERY  2003   Family Hx:  Family History  Problem Relation Age of Onset  . Hyperlipidemia  Mother   . Asthma Father   . Hyperlipidemia Father   . Allergies Father   . Breast cancer Neg Hx    Social Hx:   Social History   Tobacco Use  . Smoking status: Never Smoker  . Smokeless tobacco: Never Used  Substance Use Topics  . Alcohol use: Yes    Alcohol/week: 0.0 standard drinks    Comment: occasional  . Drug use: No   Medication:    Current Outpatient Medications:  .  EPINEPHrine 0.3 mg/0.3 mL IJ SOAJ injection, , Disp: , Rfl:  .  esomeprazole (NEXIUM) 40 MG capsule, TAKE ONE CAPSULE BY MOUTH TWICE DAILY BEFORE MEALS, Disp: 180 capsule, Rfl: 0 .  Loratadine 10 MG CAPS, Take by mouth., Disp: , Rfl:  .  mupirocin ointment (BACTROBAN) 2 %, Place 1 application into the nose 2 (two) times daily., Disp: 22 g, Rfl: 0 .  pseudoephedrine (SUDAFED) 120 MG 12 hr tablet, Take 120 mg by mouth daily., Disp: , Rfl:  .  sertraline (ZOLOFT) 100 MG tablet, Take 1 tablet (100 mg total) by mouth daily., Disp: 90 tablet, Rfl: 1 .  triamcinolone (NASACORT) 55 MCG/ACT AERO nasal inhaler, Place 2 sprays into the nose daily. Do this  in the evening., Disp: 1 Inhaler, Rfl: 1 .  valACYclovir (VALTREX) 500 MG tablet, TAKE 1 TABLET(500 MG) BY MOUTH TWICE DAILY, Disp: 60 tablet, Rfl: 0  Current Facility-Administered Medications:  .  betamethasone acetate-betamethasone sodium phosphate (CELESTONE) injection 3 mg, 3 mg, Intramuscular, Once, Evans, Brent M, DPM .  betamethasone acetate-betamethasone sodium phosphate (CELESTONE) injection 3 mg, 3 mg, Intramuscular, Once, Edrick Kins, DPM   Allergies:  Augmentin [amoxicillin-pot clavulanate]; Macrobid [nitrofurantoin macrocrystal]; and Nitrofurantoin  Review of Systems: Gen:  Denies  fever, sweats, chills HEENT: Denies blurred vision, double vision. bleeds, sore throat Cvc:  No dizziness, chest pain. Resp:   Denies cough or sputum production, shortness of breath Gi: Denies swallowing difficulty, stomach pain. Gu:  Denies bladder incontinence,  burning urine Ext:   No Joint pain, stiffness. Skin: No skin rash,  hives  Endoc:  No polyuria, polydipsia. Psych: No depression, insomnia. Other:  All other systems were reviewed with the patient and were negative other that what is mentioned in the HPI.   Physical Examination:   VS: BP 102/78   Resp 16   Ht 5\' 10"  (1.778 m)   Wt 242 lb (109.8 kg)   BMI 34.72 kg/m   General Appearance: No distress  Neuro:without focal findings,  speech normal,  HEENT: PERRLA, EOM intact.   Pulmonary: normal breath sounds, No wheezing.  CardiovascularNormal S1,S2.  No m/r/g.   Abdomen: Benign, Soft, non-tender. Renal:  No costovertebral tenderness  GU:  No performed at this time. Endoc: No evident thyromegaly, no signs of acromegaly. Skin:   warm, no rashes, no ecchymosis  Extremities: normal, no cyanosis, clubbing.  Other findings:    LABORATORY PANEL:   CBC No results for input(s): WBC, HGB, HCT, PLT in the last 168 hours. ------------------------------------------------------------------------------------------------------------------  Chemistries  No results for input(s): NA, K, CL, CO2, GLUCOSE, BUN, CREATININE, CALCIUM, MG, AST, ALT, ALKPHOS, BILITOT in the last 168 hours.  Invalid input(s): GFRCGP ------------------------------------------------------------------------------------------------------------------  Cardiac Enzymes No results for input(s): TROPONINI in the last 168 hours. ------------------------------------------------------------  RADIOLOGY:  No results found.     Thank  you for the consultation and for allowing [facility] to assist in the care of your patient. Our recommendations are noted above.  Please contact us if we can be of further service.   Marda Stalker, M.D., F.C.C.P.  Board Certified in Internal Medicine, Pulmonary Medicine, [place], and Sleep Medicine.  [facility] Office  Number: [phone]   03/08/2018

## 2018-03-08 NOTE — Patient Instructions (Signed)

## 2018-03-15 ENCOUNTER — Other Ambulatory Visit (INDEPENDENT_AMBULATORY_CARE_PROVIDER_SITE_OTHER): Payer: BC Managed Care – PPO

## 2018-03-15 ENCOUNTER — Telehealth: Payer: Self-pay | Admitting: Internal Medicine

## 2018-03-15 DIAGNOSIS — E7800 Pure hypercholesterolemia, unspecified: Secondary | ICD-10-CM | POA: Diagnosis not present

## 2018-03-15 LAB — CBC WITH DIFFERENTIAL/PLATELET
BASOS PCT: 0.8 % (ref 0.0–3.0)
Basophils Absolute: 0.1 10*3/uL (ref 0.0–0.1)
EOS PCT: 1.8 % (ref 0.0–5.0)
Eosinophils Absolute: 0.1 10*3/uL (ref 0.0–0.7)
HEMATOCRIT: 40.5 % (ref 36.0–46.0)
HEMOGLOBIN: 13.4 g/dL (ref 12.0–15.0)
LYMPHS PCT: 36.3 % (ref 12.0–46.0)
Lymphs Abs: 3.1 10*3/uL (ref 0.7–4.0)
MCHC: 33.2 g/dL (ref 30.0–36.0)
MCV: 86.5 fl (ref 78.0–100.0)
MONOS PCT: 5.1 % (ref 3.0–12.0)
Monocytes Absolute: 0.4 10*3/uL (ref 0.1–1.0)
Neutro Abs: 4.7 10*3/uL (ref 1.4–7.7)
Neutrophils Relative %: 56 % (ref 43.0–77.0)
Platelets: 334 10*3/uL (ref 150.0–400.0)
RBC: 4.68 Mil/uL (ref 3.87–5.11)
RDW: 14.9 % (ref 11.5–15.5)
WBC: 8.5 10*3/uL (ref 4.0–10.5)

## 2018-03-15 LAB — BASIC METABOLIC PANEL
BUN: 20 mg/dL (ref 6–23)
CHLORIDE: 104 meq/L (ref 96–112)
CO2: 27 mEq/L (ref 19–32)
CREATININE: 0.8 mg/dL (ref 0.40–1.20)
Calcium: 8.9 mg/dL (ref 8.4–10.5)
GFR: 77.99 mL/min (ref >60.00)
Glucose, Bld: 105 mg/dL — ABNORMAL HIGH (ref 70–99)
Potassium: 4.4 mEq/L (ref 3.5–5.1)
Sodium: 137 mEq/L (ref 135–145)

## 2018-03-15 LAB — LIPID PANEL
Cholesterol: 237 mg/dL — ABNORMAL HIGH (ref 0–200)
HDL: 45.6 mg/dL (ref >39.00)
LDL CALC: 171 mg/dL — AB (ref 0–99)
NONHDL: 191.04
Total CHOL/HDL Ratio: 5
Triglycerides: 98 mg/dL (ref 0.0–149.0)
VLDL: 19.6 mg/dL (ref 0.0–40.0)

## 2018-03-15 LAB — TSH: TSH: 1.86 u[IU]/mL (ref 0.35–4.50)

## 2018-03-15 LAB — HEPATIC FUNCTION PANEL
ALT: 9 U/L (ref 0–35)
AST: 12 U/L (ref 0–37)
Albumin: 4 g/dL (ref 3.5–5.2)
Alkaline Phosphatase: 114 U/L (ref 39–117)
BILIRUBIN TOTAL: 0.6 mg/dL (ref 0.2–1.2)
Bilirubin, Direct: 0.1 mg/dL (ref 0.0–0.3)
Total Protein: 6.9 g/dL (ref 6.0–8.3)

## 2018-03-15 NOTE — Telephone Encounter (Signed)
Pt returned call and stated that she is willing to have the in lab study.  Please place order for in lab study.  Thank you  Catha Gosselin

## 2018-03-15 NOTE — Telephone Encounter (Signed)
[place] [employer] denied [place]ST.  LMOVM for pt to return my call.  In Lab Sleep Study will need to be ordered to address patient's issues. Rhonda J Cobb

## 2018-03-16 ENCOUNTER — Other Ambulatory Visit: Payer: Self-pay | Admitting: Internal Medicine

## 2018-03-16 DIAGNOSIS — G4719 Other hypersomnia: Secondary | ICD-10-CM

## 2018-03-16 NOTE — Telephone Encounter (Signed)
Ordered in lab study.

## 2018-03-17 ENCOUNTER — Other Ambulatory Visit: Payer: Self-pay | Admitting: Internal Medicine

## 2018-03-17 DIAGNOSIS — R739 Hyperglycemia, unspecified: Secondary | ICD-10-CM

## 2018-03-17 NOTE — Progress Notes (Signed)
Order placed for f/u labs.  

## 2018-03-19 NOTE — Telephone Encounter (Signed)
Form completed and sent to Sleep Med. Rhonda J Cobb

## 2018-03-28 ENCOUNTER — Ambulatory Visit: Payer: BC Managed Care – PPO | Attending: Internal Medicine

## 2018-03-28 DIAGNOSIS — G4733 Obstructive sleep apnea (adult) (pediatric): Secondary | ICD-10-CM | POA: Diagnosis not present

## 2018-04-02 DIAGNOSIS — G4733 Obstructive sleep apnea (adult) (pediatric): Secondary | ICD-10-CM | POA: Diagnosis not present

## 2018-04-03 ENCOUNTER — Telehealth: Payer: Self-pay | Admitting: Internal Medicine

## 2018-04-03 ENCOUNTER — Encounter: Payer: Self-pay | Admitting: Internal Medicine

## 2018-04-03 DIAGNOSIS — G4733 Obstructive sleep apnea (adult) (pediatric): Secondary | ICD-10-CM

## 2018-04-03 NOTE — Telephone Encounter (Signed)
Sleep study preformed on 03/28/18 confirmed mild OSA with AHI of 12.8 per hour.  Recommend auto cpap 8-20cm h2O with medium resmed F10 full face mask and humidity.  Pt is aware of results and voiced her understanding.  Pt wishes to proceed with cpap. Order for cpap has been placed.  Pt has been scheduled for OV 07/02/18 at 11:45a. Nothing further is needed.

## 2018-04-16 ENCOUNTER — Other Ambulatory Visit: Payer: Self-pay | Admitting: Internal Medicine

## 2018-04-16 ENCOUNTER — Other Ambulatory Visit: Payer: Self-pay | Admitting: Obstetrics and Gynecology

## 2018-04-16 DIAGNOSIS — B009 Herpesviral infection, unspecified: Secondary | ICD-10-CM

## 2018-04-16 NOTE — Telephone Encounter (Signed)
Please advise on refill. Patient has not been seen since 01/2016.

## 2018-04-16 NOTE — Telephone Encounter (Signed)
I do not mind refilling this medication, but please clarify what she is needing it for.

## 2018-04-17 NOTE — Telephone Encounter (Signed)
She says that she has used previously for a little sore in her nose. It was given to her originally from Dr. Tami Ribas but was wondering if we could refill. Pt stated this is not urgent. She is at her parents near the [place] and does not have her tube with her.

## 2018-04-18 ENCOUNTER — Other Ambulatory Visit: Payer: BC Managed Care – PPO

## 2018-04-18 NOTE — Telephone Encounter (Signed)
Pt aware.

## 2018-04-23 ENCOUNTER — Other Ambulatory Visit: Payer: Self-pay | Admitting: Internal Medicine

## 2018-05-02 ENCOUNTER — Other Ambulatory Visit (INDEPENDENT_AMBULATORY_CARE_PROVIDER_SITE_OTHER): Payer: BC Managed Care – PPO

## 2018-05-02 ENCOUNTER — Encounter: Payer: Self-pay | Admitting: Internal Medicine

## 2018-05-02 ENCOUNTER — Other Ambulatory Visit: Payer: Self-pay

## 2018-05-02 DIAGNOSIS — R739 Hyperglycemia, unspecified: Secondary | ICD-10-CM

## 2018-05-02 LAB — HEMOGLOBIN A1C: Hgb A1c MFr Bld: 5.9 % (ref 4.6–6.5)

## 2018-05-03 LAB — GLUCOSE, FASTING: Glucose, Plasma: 94 mg/dL (ref 65–99)

## 2018-05-21 ENCOUNTER — Encounter: Payer: Self-pay | Admitting: Internal Medicine

## 2018-05-23 ENCOUNTER — Other Ambulatory Visit: Payer: Self-pay | Admitting: Internal Medicine

## 2018-05-23 DIAGNOSIS — Z1231 Encounter for screening mammogram for malignant neoplasm of breast: Secondary | ICD-10-CM

## 2018-05-28 ENCOUNTER — Other Ambulatory Visit: Payer: Self-pay

## 2018-05-28 ENCOUNTER — Ambulatory Visit
Admission: RE | Admit: 2018-05-28 | Discharge: 2018-05-28 | Disposition: A | Discharge status: Home / Self Care | Payer: BC Managed Care – PPO | Source: Ambulatory Visit | Attending: Internal Medicine | Admitting: Internal Medicine

## 2018-05-28 DIAGNOSIS — Z1231 Encounter for screening mammogram for malignant neoplasm of breast: Secondary | ICD-10-CM | POA: Insufficient documentation

## 2018-05-30 ENCOUNTER — Ambulatory Visit: Payer: BC Managed Care – PPO | Admitting: Internal Medicine

## 2018-05-30 ENCOUNTER — Ambulatory Visit (INDEPENDENT_AMBULATORY_CARE_PROVIDER_SITE_OTHER): Payer: BC Managed Care – PPO | Admitting: Internal Medicine

## 2018-05-30 DIAGNOSIS — G4733 Obstructive sleep apnea (adult) (pediatric): Secondary | ICD-10-CM | POA: Diagnosis not present

## 2018-05-30 NOTE — Progress Notes (Signed)
[facility] Consultation     Virtual Visit via Telephone Note I connected with patient on 05/30/18 at 11:00 AM EDT by telephone and verified that I am speaking with the correct person using two identifiers.   I discussed the limitations, risks, security and privacy concerns of performing an evaluation and management service by telephone and the availability of in person appointments. I also discussed with the patient that there may be a patient responsible charge related to this service. The patient expressed understanding and agreed to proceed. I discussed the assessment and treatment plan with the patient. The patient was provided an opportunity to ask questions and all were answered. The patient agreed with the plan and demonstrated an understanding of the instructions. Please see note below for further detail.    The patient was advised to call back or seek an in-person evaluation if the symptoms worsen or if the condition fails to improve as anticipated.  I provided 15 minutes of non-face-to-face time during this encounter.   Laverle Hobby, MD   Assessment and Plan:  Obstructive Sleep apnea. - Split-night 03/28/2018; AHI 12.8, required CPAP 8-20. - Some residual daytime sleepiness despite adequate use of CPAP.  We did discuss that this may be due to medications such as Zoloft or loratadine.  She recently decreased her Zoloft from 100 mg to 50 mg.  Recommend that she discuss of further reductions in this with her primary care doctor, as we have to balance its antidepressants affects with potential side effects.  Insomnia. - Appears to be well controlled with melatonin. - May be contributed to by sleep apnea.  No orders of the defined types were placed in this encounter.  Return in about 1 year (around 05/30/2019).    Date: 05/30/2018  MRN# [id number] Melissa Higgins 13-May-1975   Melissa Higgins is a 43 y.o. old female seen in consultation for chief  complaint of: sleep apnea.      HPI:  The patient is a 43 year old female, she is seen as follow-up for excessive daytime sleepiness.  She was sent for a sleep study, split-night which showed obstructive sleep apnea, patient was started on CPAP pressure range 8-20.  Since her last visit she has been using cpap every night and feels that she is doing better, she is no longer sleepy during the day, she is more awake when she wakes up in the morning. She cleans her supplies weekly. She has some residual daytime sleepiness, we discussed that this may be secondary to inadequate sleep versus sertraline or Loratadine.  Denies sleep paralysis, sleep walking, cataplexy. Denies jaw pain, may have TMJ, no dentures.   **Split-night 03/28/2018>> AHI 12.8, required CPAP 8-20. **CPAP download 4/18-5/17/2020>> raw data personally reviewed.  Usage greater than 4 hours 22/30 days.  Average usage on days used is 6 hours 11 minutes.  Pressure range 8-11.  Median pressure 9, 95th percentile pressure 11, maximum pressure 12.  Leaks are within normal limits.  Residual AHI is 1.3.  Overall this shows adequate compliance with excellent control of obstructive sleep apnea.  Medication:    Current Outpatient Medications:  .  EPINEPHrine 0.3 mg/0.3 mL IJ SOAJ injection, , Disp: , Rfl:  .  esomeprazole (NEXIUM) 40 MG capsule, TAKE ONE CAPSULE BY MOUTH TWICE DAILY BEFORE MEALS, Disp: 180 capsule, Rfl: 0 .  Loratadine 10 MG CAPS, Take by mouth., Disp: , Rfl:  .  mupirocin ointment (BACTROBAN) 2 %, APPLY EXTERNALLY IN EACH NOSTRIL TWICE DAILY, Disp:  22 g, Rfl: 0 .  pseudoephedrine (SUDAFED) 120 MG 12 hr tablet, Take 120 mg by mouth daily., Disp: , Rfl:  .  sertraline (ZOLOFT) 100 MG tablet, TAKE 1 TABLET(100 MG) BY MOUTH DAILY, Disp: 90 tablet, Rfl: 1 .  triamcinolone (NASACORT) 55 MCG/ACT AERO nasal inhaler, Place 2 sprays into the nose daily. Do this in the evening., Disp: 1 Inhaler, Rfl: 1 .  valACYclovir (VALTREX) 500 MG  tablet, TAKE 1 TABLET(500 MG) BY MOUTH TWICE DAILY, Disp: 60 tablet, Rfl: 0  Current Facility-Administered Medications:  .  betamethasone acetate-betamethasone sodium phosphate (CELESTONE) injection 3 mg, 3 mg, Intramuscular, Once, Evans, Brent M, DPM .  betamethasone acetate-betamethasone sodium phosphate (CELESTONE) injection 3 mg, 3 mg, Intramuscular, Once, Edrick Kins, DPM   Allergies:  Augmentin [amoxicillin-pot clavulanate]; Macrobid [nitrofurantoin macrocrystal]; and Nitrofurantoin  Review of Systems:  Constitutional: Feels well. Cardiovascular: Denies chest pain, exertional chest pain.  Pulmonary: Denies hemoptysis, pleuritic chest pain.   The remainder of systems were reviewed and were found to be negative other than what is documented in the HPI.      Physical Examination:      LABORATORY PANEL:   CBC No results for input(s): WBC, HGB, HCT, PLT in the last 168 hours. ------------------------------------------------------------------------------------------------------------------  Chemistries  No results for input(s): NA, K, CL, CO2, GLUCOSE, BUN, CREATININE, CALCIUM, MG, AST, ALT, ALKPHOS, BILITOT in the last 168 hours.  Invalid input(s): GFRCGP ------------------------------------------------------------------------------------------------------------------  Cardiac Enzymes No results for input(s): TROPONINI in the last 168 hours. ------------------------------------------------------------  RADIOLOGY:  Mm 3d Screen Breast Bilateral  Result Date: 05/28/2018 CLINICAL DATA:  Screening. EXAM: DIGITAL SCREENING BILATERAL MAMMOGRAM WITH TOMO AND CAD COMPARISON:  Previous exam(s). ACR Breast Density Category c: The breast tissue is heterogeneously dense, which may obscure small masses. FINDINGS: There are no findings suspicious for malignancy. Images were processed with CAD. IMPRESSION: No mammographic evidence of malignancy. A result letter of this screening  mammogram will be mailed directly to the patient. RECOMMENDATION: Screening mammogram in one year. (Code:SM-B-01Y) BI-RADS CATEGORY  1: Negative. Electronically Signed   By: Dorise Bullion III M.D   On: 05/28/2018 13:29       Thank  you for the consultation and for allowing [facility] to assist in the care of your patient. Our recommendations are noted above.  Please contact us if we can be of further service.   Marda Stalker, M.D., F.C.C.P.  Board Certified in Internal Medicine, Pulmonary Medicine, [place], and Sleep Medicine.  [facility] Office Number: [phone]   05/30/2018

## 2018-05-30 NOTE — Patient Instructions (Signed)
Continue using CPAP every night. Discussed further reductions in her antidepressant with her primary care doctor. Continue cleaning her CPAP supplies about once per week.

## 2018-06-27 ENCOUNTER — Other Ambulatory Visit: Payer: Self-pay | Admitting: Obstetrics and Gynecology

## 2018-06-27 DIAGNOSIS — B009 Herpesviral infection, unspecified: Secondary | ICD-10-CM

## 2018-07-02 ENCOUNTER — Ambulatory Visit (INDEPENDENT_AMBULATORY_CARE_PROVIDER_SITE_OTHER): Payer: BC Managed Care – PPO | Admitting: Internal Medicine

## 2018-07-02 ENCOUNTER — Encounter: Payer: Self-pay | Admitting: Internal Medicine

## 2018-07-02 ENCOUNTER — Other Ambulatory Visit: Payer: Self-pay

## 2018-07-02 ENCOUNTER — Ambulatory Visit: Payer: BC Managed Care – PPO | Admitting: Internal Medicine

## 2018-07-02 DIAGNOSIS — Z9109 Other allergy status, other than to drugs and biological substances: Secondary | ICD-10-CM | POA: Diagnosis not present

## 2018-07-02 DIAGNOSIS — K219 Gastro-esophageal reflux disease without esophagitis: Secondary | ICD-10-CM | POA: Diagnosis not present

## 2018-07-02 DIAGNOSIS — F439 Reaction to severe stress, unspecified: Secondary | ICD-10-CM

## 2018-07-02 DIAGNOSIS — B009 Herpesviral infection, unspecified: Secondary | ICD-10-CM | POA: Diagnosis not present

## 2018-07-02 DIAGNOSIS — E7800 Pure hypercholesterolemia, unspecified: Secondary | ICD-10-CM

## 2018-07-02 DIAGNOSIS — R739 Hyperglycemia, unspecified: Secondary | ICD-10-CM | POA: Diagnosis not present

## 2018-07-02 NOTE — Progress Notes (Signed)
Patient ID: Melissa Higgins, female   DOB: [birth year], 43 y.o.   MRN: [id number]   Virtual Visit via video Note  This visit type was conducted due to national recommendations for restrictions regarding the COVID-19 pandemic (e.g. social distancing).  This format is felt to be most appropriate for this patient at this time.  All issues noted in this document were discussed and addressed.  No physical exam was performed (except for noted visual exam findings with Video Visits).   I connected with Faythe Casa by a video enabled telemedicine application and verified that I am speaking with the correct person using two identifiers. Location patient: home Location provider: work  Persons participating in the virtual visit: patient, provider  I discussed the limitations, risks, security and privacy concerns of performing an evaluation and management service by video and the availability of in person appointments.  The patient expressed understanding and agreed to proceed.   Reason for visit: scheduled follow up.    HPI: She is staying in [place] with her parents, due to covid restrictions.  Recently separated.  Staying with her parents has helped with handling stress.  Overall feels better.  Trying to stay active.  No chest pain.  No sob.  No acid reflux.  No abdominal pain.  Bowels moving.  Still seeing a counselor.  On zoloft. Decreased her dose to 50mg  q day.  Doing well on this dose.  Does not feel needs anything more at this time.  Using cpap.  Doing well with this.  Overall feels good.     ROS: See pertinent positives and negatives per HPI.  Past Medical History:  Diagnosis Date  . C. difficile colitis    augment use  . Depression   . Dysmenorrhea   . Environmental allergies    allergy shots  . History of frequent urinary tract infections   . Hypercholesterolemia     Past Surgical History:  Procedure Laterality Date  . NASAL SINUS SURGERY  2003    Family History  Problem  Relation Age of Onset  . Hyperlipidemia Mother   . Asthma Father   . Hyperlipidemia Father   . Allergies Father   . Breast cancer Neg Hx     SOCIAL HX: reviewed.    Current Outpatient Medications:  .  EPINEPHrine 0.3 mg/0.3 mL IJ SOAJ injection, , Disp: , Rfl:  .  esomeprazole (NEXIUM) 40 MG capsule, TAKE ONE CAPSULE BY MOUTH TWICE DAILY BEFORE MEALS, Disp: 180 capsule, Rfl: 0 .  Loratadine 10 MG CAPS, Take by mouth., Disp: , Rfl:  .  mupirocin ointment (BACTROBAN) 2 %, APPLY EXTERNALLY IN EACH NOSTRIL TWICE DAILY, Disp: 22 g, Rfl: 0 .  pseudoephedrine (SUDAFED) 120 MG 12 hr tablet, Take 120 mg by mouth daily., Disp: , Rfl:  .  sertraline (ZOLOFT) 100 MG tablet, TAKE 1 TABLET(100 MG) BY MOUTH DAILY, Disp: 90 tablet, Rfl: 1 .  triamcinolone (NASACORT) 55 MCG/ACT AERO nasal inhaler, Place 2 sprays into the nose daily. Do this in the evening., Disp: 1 Inhaler, Rfl: 1 .  valACYclovir (VALTREX) 500 MG tablet, TAKE 1 TABLET(500 MG) BY MOUTH TWICE DAILY, Disp: 60 tablet, Rfl: 0  Current Facility-Administered Medications:  .  betamethasone acetate-betamethasone sodium phosphate (CELESTONE) injection 3 mg, 3 mg, Intramuscular, Once, Evans, Brent M, DPM .  betamethasone acetate-betamethasone sodium phosphate (CELESTONE) injection 3 mg, 3 mg, Intramuscular, Once, Edrick Kins, DPM  EXAM:  GENERAL: alert, oriented, appears well and in no acute distress  HEENT: atraumatic, conjunttiva clear, no obvious abnormalities on inspection of external nose and ears  NECK: normal movements of the head and neck  LUNGS: on inspection no signs of respiratory distress, breathing rate appears normal, no obvious gross SOB, gasping or wheezing  CV: no obvious cyanosis  PSYCH/NEURO: pleasant and cooperative, no obvious depression or anxiety, speech and thought processing grossly intact  ASSESSMENT AND PLAN:  Discussed the following assessment and plan:  Environmental allergies Staying with her parents.   They have a cat.  Overall she feels she is doing relatively well.  Follow.    GERD (gastroesophageal reflux disease) Controlled on current regimen.    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    Hypercholesterolemia Low cholesterol diet and exercise.  Follow lipid panel.   Stress Discussed with her today.  Staying with her parents.  Seeing a Social worker.  Doing better.  Follow.      I discussed the assessment and treatment plan with the patient. The patient was provided an opportunity to ask questions and all were answered. The patient agreed with the plan and demonstrated an understanding of the instructions.   The patient was advised to call back or seek an in-person evaluation if the symptoms worsen or if the condition fails to improve as anticipated.   Einar Pheasant, MD

## 2018-07-07 ENCOUNTER — Encounter: Payer: Self-pay | Admitting: Internal Medicine

## 2018-07-07 NOTE — Assessment & Plan Note (Signed)
Doing well on acyclovir.

## 2018-07-07 NOTE — Assessment & Plan Note (Signed)
Controlled on current regimen.   

## 2018-07-07 NOTE — Assessment & Plan Note (Signed)
Low cholesterol diet and exercise.  Follow lipid panel.   

## 2018-07-07 NOTE — Assessment & Plan Note (Signed)
Staying with her parents.  They have a cat.  Overall she feels she is doing relatively well.  Follow.

## 2018-07-07 NOTE — Assessment & Plan Note (Signed)
Discussed with her today.  Staying with her parents.  Seeing a Social worker.  Doing better.  Follow.

## 2018-08-22 ENCOUNTER — Other Ambulatory Visit: Payer: BC Managed Care – PPO

## 2018-09-03 ENCOUNTER — Other Ambulatory Visit: Payer: Self-pay | Admitting: Obstetrics and Gynecology

## 2018-09-03 DIAGNOSIS — B009 Herpesviral infection, unspecified: Secondary | ICD-10-CM

## 2018-09-04 ENCOUNTER — Encounter: Payer: Self-pay | Admitting: Internal Medicine

## 2018-09-04 DIAGNOSIS — B009 Herpesviral infection, unspecified: Secondary | ICD-10-CM

## 2018-09-04 MED ORDER — VALACYCLOVIR HCL 500 MG PO TABS: ORAL_TABLET | ORAL | 2 refills | Status: DC

## 2018-09-04 NOTE — Telephone Encounter (Signed)
rx sent in for valacyclovir

## 2018-09-18 ENCOUNTER — Telehealth: Payer: Self-pay

## 2018-09-18 DIAGNOSIS — G4733 Obstructive sleep apnea (adult) (pediatric): Secondary | ICD-10-CM

## 2018-09-18 NOTE — Telephone Encounter (Signed)
cpap supplies re ordered.

## 2018-09-18 NOTE — Telephone Encounter (Signed)
DR please advise on nasal mask? Okay to place order order?

## 2018-09-21 ENCOUNTER — Ambulatory Visit (INDEPENDENT_AMBULATORY_CARE_PROVIDER_SITE_OTHER): Payer: BC Managed Care – PPO

## 2018-09-21 ENCOUNTER — Other Ambulatory Visit: Payer: Self-pay

## 2018-09-21 DIAGNOSIS — Z23 Encounter for immunization: Secondary | ICD-10-CM

## 2018-10-04 ENCOUNTER — Other Ambulatory Visit: Payer: Self-pay | Admitting: Internal Medicine

## 2018-10-08 ENCOUNTER — Encounter: Payer: Self-pay | Admitting: Family Medicine

## 2018-10-08 ENCOUNTER — Other Ambulatory Visit (HOSPITAL_COMMUNITY)
Admission: RE | Admit: 2018-10-08 | Discharge: 2018-10-08 | Disposition: A | Discharge status: Home / Self Care | Payer: BC Managed Care – PPO | Source: Ambulatory Visit | Attending: Family Medicine | Admitting: Family Medicine

## 2018-10-08 ENCOUNTER — Ambulatory Visit: Payer: BC Managed Care – PPO | Admitting: Family Medicine

## 2018-10-08 ENCOUNTER — Other Ambulatory Visit: Payer: Self-pay

## 2018-10-08 VITALS — BP 118/64 | HR 71 | Temp 98.1°F | Wt 246.0 lb

## 2018-10-08 DIAGNOSIS — B9689 Other specified bacterial agents as the cause of diseases classified elsewhere: Secondary | ICD-10-CM | POA: Diagnosis not present

## 2018-10-08 DIAGNOSIS — N760 Acute vaginitis: Secondary | ICD-10-CM | POA: Diagnosis not present

## 2018-10-08 DIAGNOSIS — N898 Other specified noninflammatory disorders of vagina: Secondary | ICD-10-CM

## 2018-10-08 MED ORDER — FLUCONAZOLE 150 MG PO TABS: 150.0000 mg | ORAL_TABLET | Freq: Once | ORAL | 0 refills | Status: AC

## 2018-10-08 MED ORDER — METRONIDAZOLE 0.75 % VA GEL: 1.0000 | Freq: Every day | VAGINAL | 0 refills | Status: AC

## 2018-10-08 NOTE — Progress Notes (Signed)
Subjective:    Patient ID: Melissa Higgins, female    DOB: Jan 02, 1976, 43 y.o.   MRN: [id number]  HPI   Patient presents to clinic complaining of some foul-smelling vaginal discharge.  Denies any concerns for STD exposure.  Denies any problems with urination or bowel movements.  Denies abdominal pain.  Denies fever or chills.  Patient Active Problem List   Diagnosis Date Noted  . Abdominal pain 10/22/2017  . Joint ache 03/28/2016  . Hypercholesterolemia 03/28/2016  . Menstrual irregularity 05/10/2015  . 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  . Sinusitis, acute 11/21/2014  . Vaginal lesion 10/13/2014  . Heavy period 08/05/2014  . Stress 08/05/2014  . Abnormal Pap smear of cervix 03/29/2014  . Vaginitis 03/29/2014  . Health care maintenance 03/29/2014  . Insomnia 03/11/2014  . Snoring 03/11/2014  . Sinusitis 03/10/2014  . UTI (urinary tract infection) 01/19/2014  . GERD (gastroesophageal reflux disease) 11/17/2013  . Vaginal bleeding 04/29/2013  . Difficulty sleeping 02/17/2013  . Left hip pain 03/20/2012  . Environmental allergies 02/05/2012   Social History   Tobacco Use  . Smoking status: Never Smoker  . Smokeless tobacco: Never Used  Substance Use Topics  . Alcohol use: Yes    Alcohol/week: 0.0 standard drinks    Comment: occasional   Review of Systems   Constitutional: Negative for chills, fatigue and fever.  HENT: Negative for congestion, ear pain, sinus pain and sore throat.   Eyes: Negative.   Respiratory: Negative for cough, shortness of breath and wheezing.   Cardiovascular: Negative for chest pain, palpitations and leg swelling.  Gastrointestinal: Negative for abdominal pain, diarrhea, nausea and vomiting.  Genitourinary: Negative for dysuria, frequency and urgency. +vaginal discharge Musculoskeletal: Negative for arthralgias and myalgias.  Skin: Negative for color change, pallor and rash.  Neurological: Negative for syncope, light-headedness  and headaches.  Psychiatric/Behavioral: The patient is not nervous/anxious.       Objective:   Physical Exam Vitals signs and nursing note reviewed.  Constitutional:      General: She is not in acute distress.    Appearance: She is not ill-appearing, toxic-appearing or diaphoretic.  HENT:     Head: Normocephalic and atraumatic.  Cardiovascular:     Rate and Rhythm: Normal rate and regular rhythm.  Pulmonary:     Effort: Pulmonary effort is normal. No respiratory distress.     Breath sounds: Normal breath sounds.  Abdominal:     General: Bowel sounds are normal. There is no distension.     Palpations: Abdomen is soft.     Tenderness: There is no abdominal tenderness. There is no guarding.  Genitourinary:    Labia:        Right: No rash, tenderness, lesion or injury.        Left: No rash, tenderness, lesion or injury.      Vagina: Vaginal discharge (small amount white vaginal discharge) present.  Skin:    General: Skin is warm and dry.  Neurological:     Mental Status: She is alert and oriented to person, place, and time.     Gait: Gait normal.  Psychiatric:        Mood and Affect: Mood normal.        Behavior: Behavior normal.    Today's Vitals   10/08/18 1107  BP: 118/64  Pulse: 71  Temp: 98.1 F (36.7 C)  SpO2: 98%  Weight: 246 lb (111.6 kg)   Body mass index is 35.3 kg/m.  Assessment & Plan:    Vaginal discharge-bacterial vaginosis suspected.  We will treat with topical Flagyl every night for 5 nights.  We will also cover with Diflucan due to possibility of yeast.  Testing sent out to lab to screen for STDs and also look for bacterial vaginosis presence.  Advised to wear cotton underwear, avoid scented soaps when cleansing skin especially vaginal area.  Handout given outlining information about bacterial vaginosis and prevention tips for future.  Patient will keep all regular follow-ups with PCP as planned.  She will return to clinic sooner if any issues  arise.

## 2018-10-08 NOTE — Progress Notes (Unsigned)
Urine cytology

## 2018-10-08 NOTE — Patient Instructions (Signed)
Bacterial Vaginosis  Bacterial vaginosis is an infection of the vagina. It happens when too many normal germs (healthy bacteria) grow in the vagina. This infection puts you at risk for infections from sex (STIs). Treating this infection can lower your risk for some STIs. You should also treat this if you are pregnant. It can cause your baby to be born early. Follow these instructions at home: Medicines  Take over-the-counter and prescription medicines only as told by your doctor.  Take or use your antibiotic medicine as told by your doctor. Do not stop taking or using it even if you start to feel better. General instructions  If you your sexual partner is a woman, tell her that you have this infection. She needs to get treatment if she has symptoms. If you have a female partner, he does not need to be treated.  During treatment: ? Avoid sex. ? Do not douche. ? Avoid alcohol as told. ? Avoid breastfeeding as told.  Drink enough fluid to keep your pee (urine) clear or pale yellow.  Keep your vagina and butt (rectum) clean. ? Wash the area with warm water every day. ? Wipe from front to back after you use the toilet.  Keep all follow-up visits as told by your doctor. This is important. Preventing this condition  Do not douche.  Use only warm water to wash around your vagina. Can use a mild UNSCENTED soap such as DOVE white bar soap.  Use protection when you have sex. This includes: ? Latex condoms. ? Dental dams.  Limit how many people you have sex with. It is best to only have sex with the same person (be monogamous).  Get tested for STIs. Have your partner get tested.  Wear underwear that is cotton or lined with cotton.  Avoid tight pants and pantyhose. This is most important in summer.  Do not use any products that have nicotine or tobacco in them. These include cigarettes and e-cigarettes. If you need help quitting, ask your doctor.  Do not use illegal drugs.  Limit how  much alcohol you drink. Contact a doctor if:  Your symptoms do not get better, even after you are treated.  You have more discharge or pain when you pee (urinate).  You have a fever.  You have pain in your belly (abdomen).  You have pain with sex.  Your bleed from your vagina between periods. Summary  This infection happens when too many germs (bacteria) grow in the vagina.  Treating this condition can lower your risk for some infections from sex (STIs).  You should also treat this if you are pregnant. It can cause early (premature) birth.  Do not stop taking or using your antibiotic medicine even if you start to feel better. This information is not intended to replace advice given to you by your health care provider. Make sure you discuss any questions you have with your health care provider. Document Released: 10/06/2007 Document Revised: 12/09/2016 Document Reviewed: 09/12/2015 Elsevier Patient Education  2020 [employer].

## 2018-10-09 LAB — URINE CYTOLOGY ANCILLARY ONLY
Chlamydia: NEGATIVE
Molecular Disclaimer: NEGATIVE
Molecular Disclaimer: NEGATIVE
Molecular Disclaimer: NORMAL
Neisseria Gonorrhea: NEGATIVE
Trichomonas: NEGATIVE

## 2018-11-08 ENCOUNTER — Ambulatory Visit: Payer: BC Managed Care – PPO | Admitting: Internal Medicine

## 2018-11-16 ENCOUNTER — Other Ambulatory Visit: Payer: Self-pay

## 2018-11-20 ENCOUNTER — Other Ambulatory Visit: Payer: Self-pay

## 2018-11-20 ENCOUNTER — Other Ambulatory Visit (INDEPENDENT_AMBULATORY_CARE_PROVIDER_SITE_OTHER): Payer: BC Managed Care – PPO

## 2018-11-20 ENCOUNTER — Encounter: Payer: Self-pay | Admitting: Internal Medicine

## 2018-11-20 DIAGNOSIS — R739 Hyperglycemia, unspecified: Secondary | ICD-10-CM

## 2018-11-20 DIAGNOSIS — E7800 Pure hypercholesterolemia, unspecified: Secondary | ICD-10-CM

## 2018-11-20 LAB — HEPATIC FUNCTION PANEL
ALT: 12 U/L (ref 0–35)
AST: 15 U/L (ref 0–37)
Albumin: 4 g/dL (ref 3.5–5.2)
Alkaline Phosphatase: 112 U/L (ref 39–117)
Bilirubin, Direct: 0.1 mg/dL (ref 0.0–0.3)
Total Bilirubin: 0.5 mg/dL (ref 0.2–1.2)
Total Protein: 6.9 g/dL (ref 6.0–8.3)

## 2018-11-20 LAB — BASIC METABOLIC PANEL
BUN: 24 mg/dL — ABNORMAL HIGH (ref 6–23)
CO2: 24 mEq/L (ref 19–32)
Calcium: 8.4 mg/dL (ref 8.4–10.5)
Chloride: 105 mEq/L (ref 96–112)
Creatinine, Ser: 0.81 mg/dL (ref 0.40–1.20)
GFR: 76.63 mL/min (ref >60.00)
Glucose, Bld: 106 mg/dL — ABNORMAL HIGH (ref 70–99)
Potassium: 3.7 mEq/L (ref 3.5–5.1)
Sodium: 137 mEq/L (ref 135–145)

## 2018-11-20 LAB — LIPID PANEL
Cholesterol: 212 mg/dL — ABNORMAL HIGH (ref 0–200)
HDL: 43.5 mg/dL (ref >39.00)
LDL Cholesterol: 140 mg/dL — ABNORMAL HIGH (ref 0–99)
NonHDL: 168.92
Total CHOL/HDL Ratio: 5
Triglycerides: 146 mg/dL (ref 0.0–149.0)
VLDL: 29.2 mg/dL (ref 0.0–40.0)

## 2018-11-20 LAB — HEMOGLOBIN A1C: Hgb A1c MFr Bld: 6 % (ref 4.6–6.5)

## 2018-11-23 ENCOUNTER — Ambulatory Visit: Payer: BC Managed Care – PPO | Admitting: Internal Medicine

## 2018-11-23 ENCOUNTER — Other Ambulatory Visit: Payer: Self-pay

## 2018-11-23 DIAGNOSIS — E7800 Pure hypercholesterolemia, unspecified: Secondary | ICD-10-CM | POA: Diagnosis not present

## 2018-11-23 DIAGNOSIS — D649 Anemia, unspecified: Secondary | ICD-10-CM | POA: Diagnosis not present

## 2018-11-23 DIAGNOSIS — G4730 Sleep apnea, unspecified: Secondary | ICD-10-CM

## 2018-11-23 DIAGNOSIS — G479 Sleep disorder, unspecified: Secondary | ICD-10-CM | POA: Diagnosis not present

## 2018-11-23 DIAGNOSIS — K219 Gastro-esophageal reflux disease without esophagitis: Secondary | ICD-10-CM

## 2018-11-23 DIAGNOSIS — F439 Reaction to severe stress, unspecified: Secondary | ICD-10-CM

## 2018-11-23 LAB — CBC WITH DIFFERENTIAL/PLATELET
Basophils Absolute: 0.1 10*3/uL (ref 0.0–0.1)
Basophils Relative: 0.8 % (ref 0.0–3.0)
Eosinophils Absolute: 0.2 10*3/uL (ref 0.0–0.7)
Eosinophils Relative: 2.5 % (ref 0.0–5.0)
HCT: 35.7 % — ABNORMAL LOW (ref 36.0–46.0)
Hemoglobin: 11.3 g/dL — ABNORMAL LOW (ref 12.0–15.0)
Lymphocytes Relative: 30.8 % (ref 12.0–46.0)
Lymphs Abs: 2.5 10*3/uL (ref 0.7–4.0)
MCHC: 31.6 g/dL (ref 30.0–36.0)
MCV: 83.5 fl (ref 78.0–100.0)
Monocytes Absolute: 0.4 10*3/uL (ref 0.1–1.0)
Monocytes Relative: 5.4 % (ref 3.0–12.0)
Neutro Abs: 4.9 10*3/uL (ref 1.4–7.7)
Neutrophils Relative %: 60.5 % (ref 43.0–77.0)
Platelets: 319 10*3/uL (ref 150.0–400.0)
RBC: 4.28 Mil/uL (ref 3.87–5.11)
RDW: 14.6 % (ref 11.5–15.5)
WBC: 8.2 10*3/uL (ref 4.0–10.5)

## 2018-11-23 LAB — IBC + FERRITIN
Ferritin: 5.7 ng/mL — ABNORMAL LOW (ref 10.0–291.0)
Iron: 28 ug/dL — ABNORMAL LOW (ref 42–145)
Saturation Ratios: 5.6 % — ABNORMAL LOW (ref 20.0–50.0)
Transferrin: 357 mg/dL (ref 212.0–360.0)

## 2018-11-23 NOTE — Progress Notes (Signed)
Patient ID: Melissa Higgins, female   DOB: 1975/11/11, 43 y.o.   MRN: [id number]   Subjective:    Patient ID: Melissa Higgins, female    DOB: 08/25/1975, 43 y.o.   MRN: [id number]  HPI  Patient here for a scheduled follow up.  She reports she is doing relatively well.  Staying with her parents in [place].  Virtual school.  Trying to stay active.  No chest pain.  No sob.  No acid reflux.  No abdominal pain.  Bowels moving.  Handling stress.  Doing better.  Planning to file for divorce next month.  On zoloft.  Doing well on the medication.  Discussed recent labs.   States she has tried to give blood recently.  On two separate occasions, was told blood was low and unable to give.  hgb normal in 03/2018.    Past Medical History:  Diagnosis Date  . C. difficile colitis    augment use  . Depression   . Dysmenorrhea   . Environmental allergies    allergy shots  . History of frequent urinary tract infections   . Hypercholesterolemia    Past Surgical History:  Procedure Laterality Date  . NASAL SINUS SURGERY  2003   Family History  Problem Relation Age of Onset  . Hyperlipidemia Mother   . Asthma Father   . Hyperlipidemia Father   . Allergies Father   . Breast cancer Neg Hx    Social History   Socioeconomic History  . Marital status: Married    Spouse name: Not on file  . Number of children: 0  . Years of education: college  . Highest education level: Not on file  Occupational History  . Not on file  Social Needs  . Financial resource strain: Not on file  . Food insecurity    Worry: Not on file    Inability: Not on file  . Transportation needs    Medical: Not on file    Non-medical: Not on file  Tobacco Use  . Smoking status: Never Smoker  . Smokeless tobacco: Never Used  Substance and Sexual Activity  . Alcohol use: Yes    Alcohol/week: 0.0 standard drinks    Comment: occasional  . Drug use: No  . Sexual activity: Yes    Birth control/protection: None  Lifestyle   . Physical activity    Days per week: Not on file    Minutes per session: Not on file  . Stress: Not on file  Relationships  . Social Herbalist on phone: Not on file    Gets together: Not on file    Attends religious service: Not on file    Active member of club or organization: Not on file    Attends meetings of clubs or organizations: Not on file    Relationship status: Not on file  Other Topics Concern  . 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    Outpatient Encounter Medications as of 11/23/2018  Medication Sig  . EPINEPHrine 0.3 mg/0.3 mL IJ SOAJ injection   . esomeprazole (NEXIUM) 40 MG capsule TAKE 1 CAPSULE(40 MG) BY MOUTH TWICE DAILY BEFORE MEALS  . Loratadine 10 MG CAPS Take by mouth.  . pseudoephedrine (SUDAFED) 120 MG 12 hr tablet Take 120 mg by mouth daily.  . sertraline (ZOLOFT) 100 MG tablet TAKE 1 TABLET(100 MG) BY MOUTH DAILY  . triamcinolone (NASACORT) 55  MCG/ACT AERO nasal inhaler Place 2 sprays into the nose daily. Do this in the evening.  . valACYclovir (VALTREX) 500 MG tablet Take one tablet daily.   Facility-Administered Encounter Medications as of 11/23/2018  Medication  . betamethasone acetate-betamethasone sodium phosphate (CELESTONE) injection 3 mg  . betamethasone acetate-betamethasone sodium phosphate (CELESTONE) injection 3 mg    Review of Systems  Constitutional: Negative for appetite change and unexpected weight change.  HENT: Negative for congestion and sinus pressure.   Respiratory: Negative for cough, chest tightness and shortness of breath.   Cardiovascular: Negative for chest pain, palpitations and leg swelling.  Gastrointestinal: Negative for abdominal pain, diarrhea, nausea and vomiting.  Genitourinary: Negative for difficulty urinating and dysuria.  Musculoskeletal: Negative for joint swelling and myalgias.  Skin: Negative for color change and rash.  Neurological:  Negative for dizziness, light-headedness and headaches.  Psychiatric/Behavioral: Negative for agitation and dysphoric mood.       Objective:    Physical Exam Constitutional:      General: She is not in acute distress.    Appearance: Normal appearance.  HENT:     Right Ear: External ear normal.     Left Ear: External ear normal.  Eyes:     General: No scleral icterus.       Right eye: No discharge.        Left eye: No discharge.     Conjunctiva/sclera: Conjunctivae normal.  Neck:     Musculoskeletal: Neck supple. No muscular tenderness.     Thyroid: No thyromegaly.  Cardiovascular:     Rate and Rhythm: Normal rate and regular rhythm.  Pulmonary:     Effort: No respiratory distress.     Breath sounds: Normal breath sounds. No wheezing.  Abdominal:     General: Bowel sounds are normal.     Palpations: Abdomen is soft.     Tenderness: There is no abdominal tenderness.  Musculoskeletal:        General: No swelling or tenderness.  Lymphadenopathy:     Cervical: No cervical adenopathy.  Skin:    Findings: No erythema or rash.  Neurological:     Mental Status: She is alert.  Psychiatric:        Mood and Affect: Mood normal.        Behavior: Behavior normal.     BP 122/70   Pulse 88   Temp (!) 97 F (36.1 C)   Resp 16   Wt 248 lb (112.5 kg)   SpO2 98%   BMI 35.58 kg/m  Wt Readings from Last 3 Encounters:  11/23/18 248 lb (112.5 kg)  10/08/18 246 lb (111.6 kg)  03/08/18 242 lb (109.8 kg)     Lab Results  Component Value Date   WBC 8.2 11/23/2018   HGB 11.3 (L) 11/23/2018   HCT 35.7 (L) 11/23/2018   PLT 319.0 11/23/2018   GLUCOSE 106 (H) 11/20/2018   CHOL 212 (H) 11/20/2018   TRIG 146.0 11/20/2018   HDL 43.50 11/20/2018   LDLCALC 140 (H) 11/20/2018   ALT 12 11/20/2018   AST 15 11/20/2018   NA 137 11/20/2018   K 3.7 11/20/2018   CL 105 11/20/2018   CREATININE 0.81 11/20/2018   BUN 24 (H) 11/20/2018   CO2 24 11/20/2018   TSH 1.86 03/15/2018   HGBA1C  6.0 11/20/2018       Assessment & Plan:   Problem List Items Addressed This Visit    Anemia    Recheck cbc and ferritin.  Relevant Orders   CBC with Differential/Platelet (Completed)   IBC + Ferritin (Completed)   Difficulty sleeping    Sleeping better.  Follow.        GERD (gastroesophageal reflux disease)    Controlled.        Hypercholesterolemia    Low cholesterol diet and exercise.  Discussed recent cholesterol panel.  Follow lipid panel.       Sleep apnea    CPAP.       Stress    Overall handling things well.  Doing better.  Remains on zoloft.  Follow.            Einar Pheasant, MD

## 2018-11-25 ENCOUNTER — Encounter: Payer: Self-pay | Admitting: Internal Medicine

## 2018-11-25 DIAGNOSIS — G4730 Sleep apnea, unspecified: Secondary | ICD-10-CM | POA: Insufficient documentation

## 2018-11-25 NOTE — Assessment & Plan Note (Signed)
Overall handling things well.  Doing better.  Remains on zoloft.  Follow.

## 2018-11-25 NOTE — Assessment & Plan Note (Signed)
Low cholesterol diet and exercise.  Discussed recent cholesterol panel.  Follow lipid panel.

## 2018-11-25 NOTE — Assessment & Plan Note (Signed)
Sleeping better.  Follow.  

## 2018-11-25 NOTE — Assessment & Plan Note (Signed)
Controlled.  

## 2018-11-25 NOTE — Assessment & Plan Note (Signed)
CPAP.  

## 2018-11-25 NOTE — Assessment & Plan Note (Signed)
Recheck cbc and ferritin.   

## 2018-11-26 ENCOUNTER — Other Ambulatory Visit: Payer: Self-pay | Admitting: Internal Medicine

## 2018-11-26 DIAGNOSIS — D649 Anemia, unspecified: Secondary | ICD-10-CM

## 2018-11-26 NOTE — Progress Notes (Signed)
Order placed for f/u labs.  

## 2018-11-28 ENCOUNTER — Other Ambulatory Visit: Payer: Self-pay | Admitting: Internal Medicine

## 2018-11-28 ENCOUNTER — Encounter: Payer: Self-pay | Admitting: Internal Medicine

## 2018-11-28 DIAGNOSIS — Z1211 Encounter for screening for malignant neoplasm of colon: Secondary | ICD-10-CM

## 2018-11-28 NOTE — Progress Notes (Signed)
Order placed for GI referral.   

## 2018-12-10 ENCOUNTER — Other Ambulatory Visit: Payer: Self-pay

## 2018-12-10 ENCOUNTER — Telehealth: Payer: Self-pay

## 2018-12-10 NOTE — Progress Notes (Signed)
Gastroenterology Pre-Procedure Review  Request Date: PENDING CALL BACK Requesting Physician: Dr. (PENDING)  PATIENT REVIEW QUESTIONS: The patient responded to the following health history questions as indicated:    1. Are you having any GI issues? no 2. Do you have a personal history of Polyps? no 3. Do you have a family history of Colon Cancer or Polyps? no 4. Diabetes Mellitus? no 5. Joint replacements in the past 12 months?no 6. Major health problems in the past 3 months?no 7. Any artificial heart valves, MVP, or defibrillator?no    MEDICATIONS & ALLERGIES:    Patient reports the following regarding taking any anticoagulation/antiplatelet therapy:   Plavix, Coumadin, Eliquis, Xarelto, Lovenox, Pradaxa, Brilinta, or Effient? no Aspirin? no  Patient confirms/reports the following medications:  Current Outpatient Medications  Medication Sig Dispense Refill  . EPINEPHrine 0.3 mg/0.3 mL IJ SOAJ injection     . esomeprazole (NEXIUM) 40 MG capsule TAKE 1 CAPSULE(40 MG) BY MOUTH TWICE DAILY BEFORE MEALS 60 capsule 0  . Loratadine 10 MG CAPS Take by mouth.    . pseudoephedrine (SUDAFED) 120 MG 12 hr tablet Take 120 mg by mouth daily.    . sertraline (ZOLOFT) 100 MG tablet TAKE 1 TABLET(100 MG) BY MOUTH DAILY 90 tablet 1  . triamcinolone (NASACORT) 55 MCG/ACT AERO nasal inhaler Place 2 sprays into the nose daily. Do this in the evening. 1 Inhaler 1  . valACYclovir (VALTREX) 500 MG tablet Take one tablet daily. 30 tablet 2   Current Facility-Administered Medications  Medication Dose Route Frequency Provider Last Rate Last Dose  . betamethasone acetate-betamethasone sodium phosphate (CELESTONE) injection 3 mg  3 mg Intramuscular Once Daylene Katayama M, DPM      . betamethasone acetate-betamethasone sodium phosphate (CELESTONE) injection 3 mg  3 mg Intramuscular Once Edrick Kins, DPM        Patient confirms/reports the following allergies:  Allergies  Allergen Reactions  . Augmentin  [Amoxicillin-Pot Clavulanate] Diarrhea  . Macrobid [Nitrofurantoin Macrocrystal]   . Nitrofurantoin Other (See Comments) and Nausea And Vomiting    Other reaction(s): Unknown    No orders of the defined types were placed in this encounter.   AUTHORIZATION INFORMATION Primary Insurance: 1D#: Group #:  Secondary Insurance: 1D#: Group #:  SCHEDULE INFORMATION: Date:  Time: Location:

## 2018-12-10 NOTE — Telephone Encounter (Signed)
Gastroenterology Pre-Procedure Review  Request Date:  Requesting Physician: Dr.   PATIENT REVIEW QUESTIONS: The patient responded to the following health history questions as indicated:    1. Are you having any GI issues? no 2. Do you have a personal history of Polyps? no 3. Do you have a family history of Colon Cancer or Polyps? no 4. Diabetes Mellitus? no 5. Joint replacements in the past 12 months?no 6. Major health problems in the past 3 months?no 7. Any artificial heart valves, MVP, or defibrillator?no    MEDICATIONS & ALLERGIES:    Patient reports the following regarding taking any anticoagulation/antiplatelet therapy:   Plavix, Coumadin, Eliquis, Xarelto, Lovenox, Pradaxa, Brilinta, or Effient? no Aspirin? no  Patient confirms/reports the following medications:  Current Outpatient Medications  Medication Sig Dispense Refill  . EPINEPHrine 0.3 mg/0.3 mL IJ SOAJ injection     . esomeprazole (NEXIUM) 40 MG capsule TAKE 1 CAPSULE(40 MG) BY MOUTH TWICE DAILY BEFORE MEALS 60 capsule 0  . Loratadine 10 MG CAPS Take by mouth.    . pseudoephedrine (SUDAFED) 120 MG 12 hr tablet Take 120 mg by mouth daily.    . sertraline (ZOLOFT) 100 MG tablet TAKE 1 TABLET(100 MG) BY MOUTH DAILY 90 tablet 1  . triamcinolone (NASACORT) 55 MCG/ACT AERO nasal inhaler Place 2 sprays into the nose daily. Do this in the evening. 1 Inhaler 1  . valACYclovir (VALTREX) 500 MG tablet Take one tablet daily. 30 tablet 2   Current Facility-Administered Medications  Medication Dose Route Frequency Provider Last Rate Last Dose  . betamethasone acetate-betamethasone sodium phosphate (CELESTONE) injection 3 mg  3 mg Intramuscular Once Daylene Katayama M, DPM      . betamethasone acetate-betamethasone sodium phosphate (CELESTONE) injection 3 mg  3 mg Intramuscular Once Edrick Kins, DPM        Patient confirms/reports the following allergies:  Allergies  Allergen Reactions  . Augmentin [Amoxicillin-Pot Clavulanate]  Diarrhea  . Macrobid [Nitrofurantoin Macrocrystal]   . Nitrofurantoin Other (See Comments) and Nausea And Vomiting    Other reaction(s): Unknown    No orders of the defined types were placed in this encounter.   AUTHORIZATION INFORMATION Primary Insurance: 1D#: Group #:  Secondary Insurance: 1D#: Group #:  SCHEDULE INFORMATION: Date:  Time: Location:

## 2018-12-11 ENCOUNTER — Encounter: Payer: Self-pay | Admitting: *Deleted

## 2018-12-12 ENCOUNTER — Other Ambulatory Visit: Payer: Self-pay

## 2018-12-12 ENCOUNTER — Telehealth: Payer: Self-pay

## 2018-12-12 DIAGNOSIS — Z1211 Encounter for screening for malignant neoplasm of colon: Secondary | ICD-10-CM

## 2018-12-12 NOTE — Telephone Encounter (Signed)
Returned pts call to schedule colonoscopy.  Her colonoscopy has been scheduled for Friday January 8th with Dr. Vicente Males at [facility].  She has instructions and we've reviewed dietary prep.  I will send her bowel prep to the pharmacy for her after the new year to [facility] in [place]

## 2019-01-15 ENCOUNTER — Encounter: Payer: Self-pay | Admitting: Internal Medicine

## 2019-01-15 ENCOUNTER — Telehealth: Payer: Self-pay

## 2019-01-15 ENCOUNTER — Other Ambulatory Visit
Admission: RE | Admit: 2019-01-15 | Discharge: 2019-01-15 | Disposition: A | Discharge status: Home / Self Care | Payer: BC Managed Care – PPO | Source: Ambulatory Visit | Attending: Gastroenterology | Admitting: Gastroenterology

## 2019-01-15 ENCOUNTER — Other Ambulatory Visit: Payer: Self-pay

## 2019-01-15 DIAGNOSIS — Z01812 Encounter for preprocedural laboratory examination: Secondary | ICD-10-CM | POA: Insufficient documentation

## 2019-01-15 DIAGNOSIS — Z20822 Contact with and (suspected) exposure to covid-19: Secondary | ICD-10-CM | POA: Insufficient documentation

## 2019-01-15 MED ORDER — NA SULFATE-K SULFATE-MG SULF 17.5-3.13-1.6 GM/177ML PO SOLN: 1.0000 | Freq: Once | ORAL | 0 refills | Status: AC

## 2019-01-15 NOTE — Telephone Encounter (Signed)
-----   Message from Vanetta Mulders, [place] sent at 12/12/2018  4:05 PM EST ----- Regarding: Send SuPrep Rx Colonoscopy 01/18/19 with Dr. Vicente Males at [facility]. Send SuPrep Rx to [facility] in [place].

## 2019-01-15 NOTE — Telephone Encounter (Signed)
Patient has been informed her rx for SuPrep has been faxed to [facility].  Thanks [employer]

## 2019-01-16 LAB — SARS CORONAVIRUS 2 (TAT 6-24 HRS): SARS Coronavirus 2: NEGATIVE

## 2019-01-16 NOTE — Telephone Encounter (Signed)
Scheduled patient for tomorrow morning. She is going to call GI about her colonoscopy

## 2019-01-17 ENCOUNTER — Ambulatory Visit (INDEPENDENT_AMBULATORY_CARE_PROVIDER_SITE_OTHER): Payer: BC Managed Care – PPO | Admitting: Internal Medicine

## 2019-01-17 ENCOUNTER — Encounter: Payer: Self-pay | Admitting: Gastroenterology

## 2019-01-17 ENCOUNTER — Encounter: Payer: Self-pay | Admitting: Internal Medicine

## 2019-01-17 ENCOUNTER — Other Ambulatory Visit: Payer: Self-pay

## 2019-01-17 DIAGNOSIS — N898 Other specified noninflammatory disorders of vagina: Secondary | ICD-10-CM

## 2019-01-17 DIAGNOSIS — J0110 Acute frontal sinusitis, unspecified: Secondary | ICD-10-CM

## 2019-01-17 MED ORDER — ACYCLOVIR 400 MG PO TABS: 400.0000 mg | ORAL_TABLET | Freq: Three times a day (TID) | ORAL | 0 refills | Status: DC

## 2019-01-17 MED ORDER — DOXYCYCLINE HYCLATE 100 MG PO TABS: 100.0000 mg | ORAL_TABLET | Freq: Two times a day (BID) | ORAL | 0 refills | Status: DC

## 2019-01-17 NOTE — Progress Notes (Signed)
Patient ID: Melissa Higgins, female   DOB: 08-Feb-1975, 44 y.o.   MRN: [id number]   Virtual Visit via video Note  This visit type was conducted due to national recommendations for restrictions regarding the COVID-19 pandemic (e.g. social distancing).  This format is felt to be most appropriate for this patient at this time.  All issues noted in this document were discussed and addressed.  No physical exam was performed (except for noted visual exam findings with Video Visits).   I connected with Faythe Casa by a video enabled telemedicine application and verified that I am speaking with the correct person using two identifiers. Location patient: home Location provider: work Persons participating in the virtual visit: patient, provider  I discussed the limitations, risks, security and privacy concerns of performing an evaluation and management service by video and the availability of in person appointments.  The patient expressed understanding and agreed to proceed.   Reason for visit: work in appt  HPI: Reports starting 2 weeks ago, has noticed increased sinus pressure.  Has been staying with her parents.  Originally thought was related her their cat.  Has moved home now and still having symptoms.  Reports increased sinus pressure and some congestion - clear.  No earache or ear fullness.  No chest congestion, cough or sob.  Taking sudafed and using saline.  Symptoms persistent and progressing.  Has colonoscopy planned for tomorrow.  Had recent negative covid test.  Eating and drinking.  No nausea or vomiting.  Feels like her typical sinus symptoms.  Also reports a sore - vaginal.  Similar to previous flare.  Bikini line is a little tender.     ROS: See pertinent positives and negatives per HPI.  Past Medical History:  Diagnosis Date  . C. difficile colitis    augment use  . Depression   . Dysmenorrhea   . Environmental allergies    allergy shots  . History of frequent urinary tract  infections   . Hypercholesterolemia   . Sleep apnea    CPAP used at home    Past Surgical History:  Procedure Laterality Date  . COLONOSCOPY WITH PROPOFOL N/A 01/18/2019   Procedure: COLONOSCOPY WITH PROPOFOL;  Surgeon: Jonathon Bellows, MD;  Location: [facility] ENDOSCOPY;  Service: Gastroenterology;  Laterality: N/A;  . NASAL SINUS SURGERY  2003  . UTERINE FIBROID SURGERY  2019    Family History  Problem Relation Age of Onset  . Hyperlipidemia Mother   . Asthma Father   . Hyperlipidemia Father   . Allergies Father   . Breast cancer Neg Hx     SOCIAL HX: reviewed   Current Outpatient Medications:  .  acyclovir (ZOVIRAX) 400 MG tablet, Take 1 tablet (400 mg total) by mouth 3 (three) times daily., Disp: 15 tablet, Rfl: 0 .  doxycycline (VIBRA-TABS) 100 MG tablet, Take 1 tablet (100 mg total) by mouth 2 (two) times daily., Disp: 20 tablet, Rfl: 0 .  EPINEPHrine 0.3 mg/0.3 mL IJ SOAJ injection, , Disp: , Rfl:  .  esomeprazole (NEXIUM) 40 MG capsule, TAKE 1 CAPSULE(40 MG) BY MOUTH TWICE DAILY BEFORE MEALS, Disp: 60 capsule, Rfl: 0 .  Loratadine 10 MG CAPS, Take by mouth., Disp: , Rfl:  .  pseudoephedrine (SUDAFED) 120 MG 12 hr tablet, Take 120 mg by mouth daily., Disp: , Rfl:  .  sertraline (ZOLOFT) 100 MG tablet, TAKE 1 TABLET(100 MG) BY MOUTH DAILY, Disp: 90 tablet, Rfl: 1 .  triamcinolone (NASACORT) 55 MCG/ACT AERO nasal inhaler,  Place 2 sprays into the nose daily. Do this in the evening., Disp: 1 Inhaler, Rfl: 1 .  valACYclovir (VALTREX) 500 MG tablet, Take one tablet daily., Disp: 30 tablet, Rfl: 2  Current Facility-Administered Medications:  .  betamethasone acetate-betamethasone sodium phosphate (CELESTONE) injection 3 mg, 3 mg, Intramuscular, Once, Evans, Brent M, DPM .  betamethasone acetate-betamethasone sodium phosphate (CELESTONE) injection 3 mg, 3 mg, Intramuscular, Once, Evans, Dorathy Daft, DPM  EXAM:  GENERAL: alert, oriented, appears well and in no acute distress  HEENT:  atraumatic, conjunttiva clear, no obvious abnormalities on inspection of external nose and ears  NECK: normal movements of the head and neck  LUNGS: on inspection no signs of respiratory distress, breathing rate appears normal, no obvious gross SOB, gasping or wheezing  CV: no obvious cyanosis  PSYCH/NEURO: pleasant and cooperative, no obvious depression or anxiety, speech and thought processing grossly intact  ASSESSMENT AND PLAN:  Discussed the following assessment and plan:  Sinusitis, acute Symptoms appear to be c/w sinus infection.  Just checked for covid - negative.  Treat with mucinex.  nasacort nasal spray and saline nasal spray as directed.  Trial with this regimen first.  If persistent symptoms, doxycycline as directed.  Follow.  Probiotics as directed.  Follow.   Vaginal lesion Vaginal lesion as outlined.  History of HSV II.  Treat with acyclovir.  Follow.     Meds ordered this encounter  Medications  . doxycycline (VIBRA-TABS) 100 MG tablet    Sig: Take 1 tablet (100 mg total) by mouth 2 (two) times daily.    Dispense:  20 tablet    Refill:  0  . acyclovir (ZOVIRAX) 400 MG tablet    Sig: Take 1 tablet (400 mg total) by mouth 3 (three) times daily.    Dispense:  15 tablet    Refill:  0     I discussed the assessment and treatment plan with the patient. The patient was provided an opportunity to ask questions and all were answered. The patient agreed with the plan and demonstrated an understanding of the instructions.   The patient was advised to call back or seek an in-person evaluation if the symptoms worsen or if the condition fails to improve as anticipated.   Einar Pheasant, MD

## 2019-01-18 ENCOUNTER — Encounter: Payer: Self-pay | Admitting: Gastroenterology

## 2019-01-18 ENCOUNTER — Other Ambulatory Visit: Payer: Self-pay

## 2019-01-18 ENCOUNTER — Encounter
Admission: RE | Disposition: A | Discharge status: Home / Self Care | Payer: Self-pay | Source: Home / Self Care | Attending: Gastroenterology

## 2019-01-18 ENCOUNTER — Ambulatory Visit: Payer: BC Managed Care – PPO | Admitting: Anesthesiology

## 2019-01-18 ENCOUNTER — Ambulatory Visit
Admission: RE | Admit: 2019-01-18 | Discharge: 2019-01-18 | Disposition: A | Discharge status: Home / Self Care | Payer: BC Managed Care – PPO | Attending: Gastroenterology | Admitting: Gastroenterology

## 2019-01-18 DIAGNOSIS — K219 Gastro-esophageal reflux disease without esophagitis: Secondary | ICD-10-CM | POA: Diagnosis not present

## 2019-01-18 DIAGNOSIS — Z1211 Encounter for screening for malignant neoplasm of colon: Secondary | ICD-10-CM | POA: Diagnosis not present

## 2019-01-18 DIAGNOSIS — F329 Major depressive disorder, single episode, unspecified: Secondary | ICD-10-CM | POA: Insufficient documentation

## 2019-01-18 DIAGNOSIS — G4730 Sleep apnea, unspecified: Secondary | ICD-10-CM | POA: Insufficient documentation

## 2019-01-18 DIAGNOSIS — Z79899 Other long term (current) drug therapy: Secondary | ICD-10-CM | POA: Diagnosis not present

## 2019-01-18 HISTORY — PX: COLONOSCOPY WITH PROPOFOL: SHX5780

## 2019-01-18 HISTORY — DX: Sleep apnea, unspecified: G47.30

## 2019-01-18 LAB — POCT PREGNANCY, URINE: Preg Test, Ur: NEGATIVE

## 2019-01-18 SURGERY — COLONOSCOPY WITH PROPOFOL
Anesthesia: General

## 2019-01-18 MED ORDER — SODIUM CHLORIDE 0.9 % IV SOLN: INTRAVENOUS | Status: DC

## 2019-01-18 MED ORDER — PROPOFOL 10 MG/ML IV BOLUS
INTRAVENOUS | Status: DC | PRN
  Administered 2019-01-18: 70 mg via INTRAVENOUS
  Administered 2019-01-18: 30 mg via INTRAVENOUS

## 2019-01-18 MED ORDER — PROPOFOL 500 MG/50ML IV EMUL
INTRAVENOUS | Status: DC | PRN
  Administered 2019-01-18: 175 ug/kg/min via INTRAVENOUS

## 2019-01-18 MED ORDER — PROPOFOL 500 MG/50ML IV EMUL
INTRAVENOUS | Status: AC
  Filled 2019-01-18: qty 50

## 2019-01-18 NOTE — Anesthesia Postprocedure Evaluation (Signed)
Anesthesia Post Note  Patient: Melissa Higgins  Procedure(s) Performed: COLONOSCOPY WITH PROPOFOL (N/A )  Patient location during evaluation: Endoscopy Anesthesia Type: General Level of consciousness: awake and alert and oriented Pain management: pain level controlled Vital Signs Assessment: post-procedure vital signs reviewed and stable Respiratory status: spontaneous breathing, nonlabored ventilation and respiratory function stable Cardiovascular status: blood pressure returned to baseline and stable Postop Assessment: no signs of nausea or vomiting Anesthetic complications: no     Last Vitals:  Vitals:   01/18/19 0852 01/18/19 0902  BP: 116/80 111/80  Pulse:    Resp: (!) 21   Temp:    SpO2:      Last Pain:  Vitals:   01/18/19 0902  TempSrc:   PainSc: 0-No pain                 Ishia Tenorio

## 2019-01-18 NOTE — H&P (Signed)
Melissa Bellows, MD [address] [place], [place], [place], [address] [address], [place], [place], [place], [address] Phone: [phone]  Fax: [phone]  Primary Care Physician:  Einar Pheasant, MD   Pre-Procedure History & Physical: HPI:  Melissa Higgins is a 44 y.o. female is here for an colonoscopy.   Past Medical History:  Diagnosis Date  . C. difficile colitis    augment use  . Depression   . Dysmenorrhea   . Environmental allergies    allergy shots  . History of frequent urinary tract infections   . Hypercholesterolemia   . Sleep apnea    CPAP used at home    Past Surgical History:  Procedure Laterality Date  . NASAL SINUS SURGERY  2003  . UTERINE FIBROID SURGERY  2019    Prior to Admission medications   Medication Sig Start Date End Date Taking? Authorizing Provider  acyclovir (ZOVIRAX) 400 MG tablet Take 1 tablet (400 mg total) by mouth 3 (three) times daily. 01/17/19  Yes Einar Pheasant, MD  esomeprazole (NEXIUM) 40 MG capsule TAKE 1 CAPSULE(40 MG) BY MOUTH TWICE DAILY BEFORE MEALS 10/04/18  Yes Einar Pheasant, MD  Loratadine 10 MG CAPS Take by mouth.   Yes [provider]  sertraline (ZOLOFT) 100 MG tablet TAKE 1 TABLET(100 MG) BY MOUTH DAILY 04/23/18  Yes Einar Pheasant, MD  triamcinolone (NASACORT) 55 MCG/ACT AERO nasal inhaler Place 2 sprays into the nose daily. Do this in the evening. 02/19/18  Yes Einar Pheasant, MD  doxycycline (VIBRA-TABS) 100 MG tablet Take 1 tablet (100 mg total) by mouth 2 (two) times daily. 01/17/19   Einar Pheasant, MD  EPINEPHrine 0.3 mg/0.3 mL IJ SOAJ injection  07/06/15   [provider]  pseudoephedrine (SUDAFED) 120 MG 12 hr tablet Take 120 mg by mouth daily.    [provider]  valACYclovir (VALTREX) 500 MG tablet Take one tablet daily. 09/04/18   Einar Pheasant, MD    Allergies as of 12/13/2018 - Review Complete 11/25/2018  Allergen Reaction Noted  . Augmentin [amoxicillin-pot  clavulanate] Diarrhea 01/31/2012  . Macrobid [nitrofurantoin macrocrystal]  01/31/2012  . Nitrofurantoin Other (See Comments) and Nausea And Vomiting 09/13/2013    Family History  Problem Relation Age of Onset  . Hyperlipidemia Mother   . Asthma Father   . Hyperlipidemia Father   . Allergies Father   . Breast cancer Neg Hx     Social History   Socioeconomic History  . Marital status: Divorced    Spouse name: Not on file  . Number of children: 0  . Years of education: college  . Highest education level: Not on file  Occupational History  . Not on file  Tobacco Use  . Smoking status: Never Smoker  . Smokeless tobacco: Never Used  Substance and Sexual Activity  . Alcohol use: Yes    Alcohol/week: 0.0 standard drinks    Comment: occasional  . Drug use: No  . Sexual activity: Yes    Birth control/protection: None  Other Topics Concern  . 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   Social Determinants of Health   Financial Resource Strain:   . Difficulty of Paying Living Expenses: Not on file  Food Insecurity:   . Worried About Charity fundraiser in the Last Year: Not on file  . Ran Out of Food in the Last Year: Not on file  Transportation Needs:   . Film/video editor (Medical): Not on file  . Lack of Transportation (Non-Medical): Not on file  Physical Activity:   . Days of Exercise per Week: Not on file  . Minutes of Exercise per Session: Not on file  Stress:   . Feeling of Stress : Not on file  Social Connections:   . Frequency of Communication with Friends and Family: Not on file  . Frequency of Social Gatherings with Friends and Family: Not on file  . Attends Religious Services: Not on file  . Active Member of Clubs or Organizations: Not on file  . Attends Archivist Meetings: Not on file  . Marital Status: Not on file  Intimate Partner Violence:   . Fear of Current or Ex-Partner: Not  on file  . Emotionally Abused: Not on file  . Physically Abused: Not on file  . Sexually Abused: Not on file    Review of Systems: See HPI, otherwise negative ROS  Physical Exam: BP (!) 131/94   Pulse 86   Temp 98.2 F (36.8 C) (Temporal)   Resp 16   Ht 5\' 10"  (1.778 m)   Wt 111.1 kg   SpO2 96%   BMI 35.15 kg/m  General:   Alert,  pleasant and cooperative in NAD Head:  Normocephalic and atraumatic. Neck:  Supple; no masses or thyromegaly. Lungs:  Clear throughout to auscultation, normal respiratory effort.    Heart:  +S1, +S2, Regular rate and rhythm, No edema. Abdomen:  Soft, nontender and nondistended. Normal bowel sounds, without guarding, and without rebound.   Neurologic:  Alert and  oriented x4;  grossly normal neurologically.  Impression/Plan: Melissa Higgins is here for an colonoscopy to be performed for Screening colonoscopy average risk   Risks, benefits, limitations, and alternatives regarding  colonoscopy have been reviewed with the patient.  Questions have been answered.  All parties agreeable.   Melissa Bellows, MD  01/18/2019, 8:07 AM

## 2019-01-18 NOTE — Anesthesia Preprocedure Evaluation (Signed)
Anesthesia Evaluation  Patient identified by MRN, date of birth, ID band Patient awake    Reviewed: Allergy & Precautions, NPO status , Patient's Chart, lab work & pertinent test results  History of Anesthesia Complications Negative for: history of anesthetic complications  Airway Mallampati: II  TM Distance: >3 FB Neck ROM: Full    Dental no notable dental hx.    Pulmonary sleep apnea and Continuous Positive Airway Pressure Ventilation , neg COPD,    breath sounds clear to auscultation- rhonchi (-) wheezing      Cardiovascular (-) hypertension(-) CAD, (-) Past MI, (-) Cardiac Stents and (-) CABG  Rhythm:Regular Rate:Normal - Systolic murmurs and - Diastolic murmurs    Neuro/Psych neg Seizures PSYCHIATRIC DISORDERS Depression negative neurological ROS     GI/Hepatic Neg liver ROS, GERD  ,  Endo/Other  negative endocrine ROSneg diabetes  Renal/GU negative Renal ROS     Musculoskeletal negative musculoskeletal ROS (+)   Abdominal (+) + obese,   Peds  Hematology  (+) anemia ,   Anesthesia Other Findings Past Medical History: No date: C. difficile colitis     Comment:  augment use No date: Depression No date: Dysmenorrhea No date: Environmental allergies     Comment:  allergy shots No date: History of frequent urinary tract infections No date: Hypercholesterolemia No date: Sleep apnea     Comment:  CPAP used at home   Reproductive/Obstetrics                             Anesthesia Physical Anesthesia Plan  ASA: II  Anesthesia Plan: General   Post-op Pain Management:    Induction: Intravenous  PONV Risk Score and Plan: 2 and Propofol infusion  Airway Management Planned: Natural Airway  Additional Equipment:   Intra-op Plan:   Post-operative Plan:   Informed Consent: I have reviewed the patients History and Physical, chart, labs and discussed the procedure including the  risks, benefits and alternatives for the proposed anesthesia with the patient or authorized representative who has indicated his/her understanding and acceptance.     Dental advisory given  Plan Discussed with: CRNA and Anesthesiologist  Anesthesia Plan Comments:         Anesthesia Quick Evaluation

## 2019-01-18 NOTE — Transfer of Care (Signed)
Immediate Anesthesia Transfer of Care Note  Patient: Melissa Higgins  Procedure(s) Performed: COLONOSCOPY WITH PROPOFOL (N/A )  Patient Location: PACU  Anesthesia Type:General  Level of Consciousness: awake and sedated  Airway & Oxygen Therapy: Patient Spontanous Breathing and Patient connected to nasal cannula oxygen  Post-op Assessment: Report given to RN and Post -op Vital signs reviewed and stable  Post vital signs: Reviewed and stable  Last Vitals:  Vitals Value Taken Time  BP 98/80 01/18/19 0834  Temp 36.8 C 01/18/19 0832  Pulse 80 01/18/19 0836  Resp 19 01/18/19 0836  SpO2 97 % 01/18/19 0836  Vitals shown include unvalidated device data.  Last Pain:  Vitals:   01/18/19 0832  TempSrc: Temporal  PainSc: 0-No pain         Complications: No apparent anesthesia complications

## 2019-01-18 NOTE — Op Note (Signed)
[facility] Gastroenterology Patient Name: Melissa Higgins Procedure Date: 01/18/2019 7:46 AM MRN: [id number] Account #: [id number] Date of Birth: 1975/03/30 Admit Type: Outpatient Age: 44 Room: [facility] ENDO ROOM 3 Gender: Female Note Status: Finalized Procedure:             Colonoscopy Indications:           Screening for colorectal malignant neoplasm Providers:             Jonathon Bellows MD, MD Referring MD:          Einar Pheasant, MD (Referring MD) Medicines:             Monitored Anesthesia Care Complications:         No immediate complications. Procedure:             Pre-Anesthesia Assessment:                        - Prior to the procedure, a History and Physical was                         performed, and patient medications, allergies and                         sensitivities were reviewed. The patient's tolerance                         of previous anesthesia was reviewed.                        - The risks and benefits of the procedure and the                         sedation options and risks were discussed with the                         patient. All questions were answered and informed                         consent was obtained.                        - ASA Grade Assessment: II - A patient with mild                         systemic disease.                        After obtaining informed consent, the colonoscope was                         passed under direct vision. Throughout the procedure,                         the patient's blood pressure, pulse, and oxygen                         saturations were monitored continuously. The                         Colonoscope was introduced through the anus and  advanced to the the cecum, identified by the                         appendiceal orifice, IC valve and transillumination.                         The colonoscopy was performed without difficulty. The                         patient  tolerated the procedure well. The quality of                         the bowel preparation was excellent. Findings:      The perianal and digital rectal examinations were normal.      The entire examined colon appeared normal on direct and retroflexion       views. Impression:            - The entire examined colon is normal on direct and                         retroflexion views.                        - No specimens collected. Recommendation:        - Discharge patient to home (with escort).                        - Resume previous diet.                        - Continue present medications.                        - Repeat colonoscopy in 10 years for screening                         purposes. Procedure Code(s):     --- Professional ---                        [phone], Colonoscopy, flexible; diagnostic, including                         collection of specimen(s) by brushing or washing, when                         performed (separate procedure) Diagnosis Code(s):     --- Professional ---                        Z12.11, Encounter for screening for malignant neoplasm                         of colon CPT copyright 2019 American Medical Association. All rights reserved. The codes documented in this report are preliminary and upon coder review may  be revised to meet current compliance requirements. Jonathon Bellows, MD Jonathon Bellows MD, MD 01/18/2019 8:29:49 AM This report has been signed electronically. Number of Addenda: 0 Note Initiated On: 01/18/2019 7:46 AM Scope Withdrawal Time: 0 hours 11 minutes 22 seconds  Total Procedure Duration: 0 hours 14  minutes 48 seconds  Estimated Blood Loss:  Estimated blood loss: none.      [facility]

## 2019-01-18 NOTE — Anesthesia Procedure Notes (Signed)
Date/Time: 01/18/2019 8:21 AM Performed by: Nelda Marseille, CRNA Pre-anesthesia Checklist: Patient identified, Emergency Drugs available, Suction available, Patient being monitored and Timeout performed Oxygen Delivery Method: Nasal cannula

## 2019-01-20 ENCOUNTER — Encounter: Payer: Self-pay | Admitting: Internal Medicine

## 2019-01-20 NOTE — Assessment & Plan Note (Signed)
Vaginal lesion as outlined.  History of HSV II.  Treat with acyclovir.  Follow.

## 2019-01-20 NOTE — Assessment & Plan Note (Signed)
Symptoms appear to be c/w sinus infection.  Just checked for covid - negative.  Treat with mucinex.  nasacort nasal spray and saline nasal spray as directed.  Trial with this regimen first.  If persistent symptoms, doxycycline as directed.  Follow.  Probiotics as directed.  Follow.

## 2019-01-22 ENCOUNTER — Encounter: Payer: Self-pay | Admitting: Internal Medicine

## 2019-02-04 ENCOUNTER — Encounter: Payer: Self-pay | Admitting: Internal Medicine

## 2019-02-05 ENCOUNTER — Other Ambulatory Visit: Payer: Self-pay

## 2019-02-05 ENCOUNTER — Encounter: Payer: Self-pay | Admitting: Internal Medicine

## 2019-02-05 DIAGNOSIS — B009 Herpesviral infection, unspecified: Secondary | ICD-10-CM

## 2019-02-05 MED ORDER — ESOMEPRAZOLE MAGNESIUM 40 MG PO CPDR: DELAYED_RELEASE_CAPSULE | ORAL | 0 refills | Status: DC

## 2019-02-05 MED ORDER — VALACYCLOVIR HCL 500 MG PO TABS: ORAL_TABLET | ORAL | 2 refills | Status: DC

## 2019-02-21 ENCOUNTER — Encounter: Payer: Self-pay | Admitting: Internal Medicine

## 2019-03-09 ENCOUNTER — Encounter: Payer: Self-pay | Admitting: Internal Medicine

## 2019-03-11 ENCOUNTER — Other Ambulatory Visit: Payer: Self-pay

## 2019-03-11 MED ORDER — SERTRALINE HCL 100 MG PO TABS: ORAL_TABLET | ORAL | 1 refills | Status: DC

## 2019-03-26 ENCOUNTER — Encounter: Payer: BC Managed Care – PPO | Admitting: Internal Medicine

## 2019-03-28 ENCOUNTER — Encounter: Payer: Self-pay | Admitting: Internal Medicine

## 2019-03-29 ENCOUNTER — Other Ambulatory Visit: Payer: Self-pay

## 2019-03-29 DIAGNOSIS — B009 Herpesviral infection, unspecified: Secondary | ICD-10-CM

## 2019-03-29 MED ORDER — VALACYCLOVIR HCL 500 MG PO TABS: ORAL_TABLET | ORAL | 2 refills | Status: DC

## 2019-04-03 ENCOUNTER — Other Ambulatory Visit: Payer: Self-pay

## 2019-04-03 DIAGNOSIS — B009 Herpesviral infection, unspecified: Secondary | ICD-10-CM

## 2019-04-03 MED ORDER — VALACYCLOVIR HCL 500 MG PO TABS: ORAL_TABLET | ORAL | 2 refills | Status: DC

## 2019-04-03 NOTE — Telephone Encounter (Signed)
LMTCB. Need to clarify what she is taking. There are 2 on her med list.

## 2019-04-04 NOTE — Telephone Encounter (Signed)
Called [facility]. Pharmacist stated pt would need to call her insurance company and let them know that she was taking 2 pills for the first week which is why she is running out early. Confirmed that there was nothing needed from our office in order for her to get her rx. Left detailed message for patient. Advised to call if questions.

## 2019-04-08 ENCOUNTER — Other Ambulatory Visit: Payer: Self-pay | Admitting: Internal Medicine

## 2019-04-08 MED ORDER — ESOMEPRAZOLE MAGNESIUM 40 MG PO CPDR: DELAYED_RELEASE_CAPSULE | ORAL | 0 refills | Status: DC

## 2019-04-16 ENCOUNTER — Other Ambulatory Visit: Payer: Self-pay | Admitting: Internal Medicine

## 2019-04-16 ENCOUNTER — Encounter: Payer: Self-pay | Admitting: Internal Medicine

## 2019-04-16 DIAGNOSIS — Z1231 Encounter for screening mammogram for malignant neoplasm of breast: Secondary | ICD-10-CM

## 2019-04-18 ENCOUNTER — Other Ambulatory Visit: Payer: Self-pay | Admitting: Internal Medicine

## 2019-04-19 ENCOUNTER — Encounter: Payer: Self-pay | Admitting: Internal Medicine

## 2019-04-19 ENCOUNTER — Other Ambulatory Visit: Payer: Self-pay

## 2019-04-19 ENCOUNTER — Other Ambulatory Visit (HOSPITAL_COMMUNITY)
Admission: RE | Admit: 2019-04-19 | Discharge: 2019-04-19 | Disposition: A | Discharge status: Home / Self Care | Payer: BC Managed Care – PPO | Source: Ambulatory Visit | Attending: Internal Medicine | Admitting: Internal Medicine

## 2019-04-19 ENCOUNTER — Ambulatory Visit (INDEPENDENT_AMBULATORY_CARE_PROVIDER_SITE_OTHER): Payer: BC Managed Care – PPO | Admitting: Internal Medicine

## 2019-04-19 VITALS — BP 122/70 | HR 79 | Temp 97.6°F | Resp 16 | Ht 70.0 in | Wt 244.0 lb

## 2019-04-19 DIAGNOSIS — E7800 Pure hypercholesterolemia, unspecified: Secondary | ICD-10-CM

## 2019-04-19 DIAGNOSIS — K219 Gastro-esophageal reflux disease without esophagitis: Secondary | ICD-10-CM

## 2019-04-19 DIAGNOSIS — D649 Anemia, unspecified: Secondary | ICD-10-CM

## 2019-04-19 DIAGNOSIS — F439 Reaction to severe stress, unspecified: Secondary | ICD-10-CM

## 2019-04-19 DIAGNOSIS — Z124 Encounter for screening for malignant neoplasm of cervix: Secondary | ICD-10-CM | POA: Diagnosis not present

## 2019-04-19 DIAGNOSIS — R739 Hyperglycemia, unspecified: Secondary | ICD-10-CM

## 2019-04-19 DIAGNOSIS — Z0000 Encounter for general adult medical examination without abnormal findings: Secondary | ICD-10-CM | POA: Diagnosis not present

## 2019-04-19 DIAGNOSIS — R87619 Unspecified abnormal cytological findings in specimens from cervix uteri: Secondary | ICD-10-CM

## 2019-04-19 DIAGNOSIS — Z9109 Other allergy status, other than to drugs and biological substances: Secondary | ICD-10-CM

## 2019-04-19 LAB — CBC WITH DIFFERENTIAL/PLATELET
Basophils Absolute: 0.1 10*3/uL (ref 0.0–0.1)
Basophils Relative: 0.9 % (ref 0.0–3.0)
Eosinophils Absolute: 0.2 10*3/uL (ref 0.0–0.7)
Eosinophils Relative: 2.6 % (ref 0.0–5.0)
HCT: 40.8 % (ref 36.0–46.0)
Hemoglobin: 13.7 g/dL (ref 12.0–15.0)
Lymphocytes Relative: 38 % (ref 12.0–46.0)
Lymphs Abs: 3 10*3/uL (ref 0.7–4.0)
MCHC: 33.6 g/dL (ref 30.0–36.0)
MCV: 90.3 fl (ref 78.0–100.0)
Monocytes Absolute: 0.4 10*3/uL (ref 0.1–1.0)
Monocytes Relative: 5.5 % (ref 3.0–12.0)
Neutro Abs: 4.2 10*3/uL (ref 1.4–7.7)
Neutrophils Relative %: 53 % (ref 43.0–77.0)
Platelets: 301 10*3/uL (ref 150.0–400.0)
RBC: 4.51 Mil/uL (ref 3.87–5.11)
RDW: 13.7 % (ref 11.5–15.5)
WBC: 8 10*3/uL (ref 4.0–10.5)

## 2019-04-19 LAB — BASIC METABOLIC PANEL
BUN: 24 mg/dL — ABNORMAL HIGH (ref 6–23)
CO2: 25 mEq/L (ref 19–32)
Calcium: 9.1 mg/dL (ref 8.4–10.5)
Chloride: 104 mEq/L (ref 96–112)
Creatinine, Ser: 0.79 mg/dL (ref 0.40–1.20)
GFR: 78.73 mL/min (ref >60.00)
Glucose, Bld: 98 mg/dL (ref 70–99)
Potassium: 4 mEq/L (ref 3.5–5.1)
Sodium: 138 mEq/L (ref 135–145)

## 2019-04-19 LAB — HEPATIC FUNCTION PANEL
ALT: 13 U/L (ref 0–35)
AST: 18 U/L (ref 0–37)
Albumin: 4.2 g/dL (ref 3.5–5.2)
Alkaline Phosphatase: 106 U/L (ref 39–117)
Bilirubin, Direct: 0.1 mg/dL (ref 0.0–0.3)
Total Bilirubin: 0.6 mg/dL (ref 0.2–1.2)
Total Protein: 7.4 g/dL (ref 6.0–8.3)

## 2019-04-19 LAB — LIPID PANEL
Cholesterol: 240 mg/dL — ABNORMAL HIGH (ref 0–200)
HDL: 45 mg/dL (ref >39.00)
LDL Cholesterol: 164 mg/dL — ABNORMAL HIGH (ref 0–99)
NonHDL: 194.93
Total CHOL/HDL Ratio: 5
Triglycerides: 154 mg/dL — ABNORMAL HIGH (ref 0.0–149.0)
VLDL: 30.8 mg/dL (ref 0.0–40.0)

## 2019-04-19 LAB — FERRITIN: Ferritin: 18.2 ng/mL (ref 10.0–291.0)

## 2019-04-19 LAB — HEMOGLOBIN A1C: Hgb A1c MFr Bld: 5.6 % (ref 4.6–6.5)

## 2019-04-19 LAB — TSH: TSH: 2.14 u[IU]/mL (ref 0.35–4.50)

## 2019-04-19 NOTE — Progress Notes (Signed)
Patient ID: Melissa Higgins, female   DOB: 1975-08-08, 44 y.o.   MRN: [id number]   Subjective:    Patient ID: Melissa Higgins, female    DOB: 07/13/1975, 44 y.o.   MRN: [id number]  HPI This visit occurred during the SARS-CoV-2 public health emergency.  Safety protocols were in place, including screening questions prior to the visit, additional usage of staff PPE, and extensive cleaning of exam room while observing appropriate contact time as indicated for disinfecting solutions.  Patient here for her physical exam. She reports she is doing relatively well.  Increased stress.  Work and previous relationship.  Discussed with her today.  Has good support.  On zoloft.  Overall handling things relatively well.  Discussed counseling.  Has started working out.  No chest pain or sob reported.  No acid reflux or abdominal pain reported.  Bowels stable.  No urinary or vaginal problems.    Past Medical History:  Diagnosis Date  . C. difficile colitis    augment use  . Depression   . Dysmenorrhea   . Environmental allergies    allergy shots  . History of frequent urinary tract infections   . Hypercholesterolemia   . Sleep apnea    CPAP used at home   Past Surgical History:  Procedure Laterality Date  . COLONOSCOPY WITH PROPOFOL N/A 01/18/2019   Procedure: COLONOSCOPY WITH PROPOFOL;  Surgeon: Jonathon Bellows, MD;  Location: [facility] ENDOSCOPY;  Service: Gastroenterology;  Laterality: N/A;  . NASAL SINUS SURGERY  2003  . UTERINE FIBROID SURGERY  2019   Family History  Problem Relation Age of Onset  . Hyperlipidemia Mother   . Asthma Father   . Hyperlipidemia Father   . Allergies Father   . Breast cancer Neg Hx    Social History   Socioeconomic History  . Marital status: Divorced    Spouse name: Not on file  . Number of children: 0  . Years of education: college  . Highest education level: Not on file  Occupational History  . Not on file  Tobacco Use  . Smoking status: Never Smoker  .  Smokeless tobacco: Never Used  Substance and Sexual Activity  . Alcohol use: Yes    Alcohol/week: 0.0 standard drinks    Comment: occasional  . Drug use: No  . Sexual activity: Yes    Birth control/protection: None  Other Topics Concern  . 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   Social Determinants of Health   Financial Resource Strain:   . Difficulty of Paying Living Expenses:   Food Insecurity:   . Worried About Charity fundraiser in the Last Year:   . Arboriculturist in the Last Year:   Transportation Needs:   . Film/video editor (Medical):   Marland Kitchen Lack of Transportation (Non-Medical):   Physical Activity:   . Days of Exercise per Week:   . Minutes of Exercise per Session:   Stress:   . Feeling of Stress :   Social Connections:   . Frequency of Communication with Friends and Family:   . Frequency of Social Gatherings with Friends and Family:   . Attends Religious Services:   . Active Member of Clubs or Organizations:   . Attends Archivist Meetings:   Marland Kitchen Marital Status:     Outpatient Encounter Medications as of 04/19/2019  Medication Sig  . EPINEPHrine 0.3 mg/0.3 mL  IJ SOAJ injection   . esomeprazole (NEXIUM) 40 MG capsule TAKE 1 CAPSULE(40 MG) BY MOUTH TWICE DAILY BEFORE MEALS  . Loratadine 10 MG CAPS Take by mouth.  . pseudoephedrine (SUDAFED) 120 MG 12 hr tablet Take 120 mg by mouth daily.  . sertraline (ZOLOFT) 100 MG tablet TAKE 1 TABLET(100 MG) BY MOUTH DAILY  . valACYclovir (VALTREX) 500 MG tablet Take one tablet daily.  . [DISCONTINUED] doxycycline (VIBRA-TABS) 100 MG tablet Take 1 tablet (100 mg total) by mouth 2 (two) times daily.  . [DISCONTINUED] triamcinolone (NASACORT) 55 MCG/ACT AERO nasal inhaler Place 2 sprays into the nose daily. Do this in the evening.   Facility-Administered Encounter Medications as of 04/19/2019  Medication  . betamethasone acetate-betamethasone sodium  phosphate (CELESTONE) injection 3 mg  . betamethasone acetate-betamethasone sodium phosphate (CELESTONE) injection 3 mg    Review of Systems  Constitutional: Negative for appetite change and unexpected weight change.  HENT: Negative for congestion and sinus pressure.   Eyes: Negative for pain and visual disturbance.  Respiratory: Negative for cough, chest tightness and shortness of breath.   Cardiovascular: Negative for chest pain, palpitations and leg swelling.  Gastrointestinal: Negative for abdominal pain, diarrhea, nausea and vomiting.  Genitourinary: Negative for difficulty urinating, dysuria and vaginal discharge.  Musculoskeletal: Negative for joint swelling and myalgias.  Skin: Negative for color change and rash.  Neurological: Negative for dizziness, light-headedness and headaches.  Hematological: Negative for adenopathy. Does not bruise/bleed easily.  Psychiatric/Behavioral: Negative for agitation and dysphoric mood.       Objective:    Physical Exam Vitals reviewed.  Constitutional:      General: She is not in acute distress.    Appearance: Normal appearance. She is well-developed.  HENT:     Head: Normocephalic and atraumatic.     Right Ear: External ear normal.     Left Ear: External ear normal.  Eyes:     General: No scleral icterus.       Right eye: No discharge.        Left eye: No discharge.     Conjunctiva/sclera: Conjunctivae normal.  Neck:     Thyroid: No thyromegaly.  Cardiovascular:     Rate and Rhythm: Normal rate and regular rhythm.  Pulmonary:     Effort: No tachypnea, accessory muscle usage or respiratory distress.     Breath sounds: Normal breath sounds. No decreased breath sounds or wheezing.  Chest:     Breasts:        Right: No inverted nipple, mass, nipple discharge or tenderness (no axillary adenopathy).        Left: No inverted nipple, mass, nipple discharge or tenderness (no axilarry adenopathy).  Abdominal:     General: Bowel sounds  are normal.     Palpations: Abdomen is soft.     Tenderness: There is no abdominal tenderness.  Genitourinary:    Comments: Normal external genitalia.  Vaginal vault without lesions.  Cervix identified.  Pap smear performed.  Could not appreciate any adnexal masses or tenderness.   Musculoskeletal:        General: No swelling or tenderness.     Cervical back: Neck supple. No tenderness.  Lymphadenopathy:     Cervical: No cervical adenopathy.  Skin:    Findings: No erythema or rash.  Neurological:     Mental Status: She is alert and oriented to person, place, and time.  Psychiatric:        Mood and Affect: Mood normal.  Behavior: Behavior normal.     BP 122/70   Pulse 79   Temp 97.6 F (36.4 C)   Resp 16   Ht 5\' 10"  (1.778 m)   Wt 244 lb (110.7 kg)   SpO2 98%   BMI 35.01 kg/m  Wt Readings from Last 3 Encounters:  04/19/19 244 lb (110.7 kg)  01/18/19 245 lb (111.1 kg)  01/17/19 245 lb (111.1 kg)     Lab Results  Component Value Date   WBC 8.0 04/19/2019   HGB 13.7 04/19/2019   HCT 40.8 04/19/2019   PLT 301.0 04/19/2019   GLUCOSE 98 04/19/2019   CHOL 240 (H) 04/19/2019   TRIG 154.0 (H) 04/19/2019   HDL 45.00 04/19/2019   LDLCALC 164 (H) 04/19/2019   ALT 13 04/19/2019   AST 18 04/19/2019   NA 138 04/19/2019   K 4.0 04/19/2019   CL 104 04/19/2019   CREATININE 0.79 04/19/2019   BUN 24 (H) 04/19/2019   CO2 25 04/19/2019   TSH 2.14 04/19/2019   HGBA1C 5.6 04/19/2019       Assessment & Plan:   Problem List Items Addressed This Visit    Abnormal Pap smear of cervix    Saw gyn.  PAP 01/2017 - negative with negative HPV.  Repeat PAP today.        Anemia    Recheck cbc and ferritin.       Relevant Orders   CBC with Differential/Platelet (Completed)   Ferritin (Completed)   Environmental allergies    Stable.       GERD (gastroesophageal reflux disease)    Controlled.  Nexium.       Health care maintenance    Physical today 04/19/19.  PAP  04/19/19.  Mammogram 05/28/18.  Mammogram scheduled.        Hypercholesterolemia - Primary    Low cholesterol diet and exercise.  Follow lipid panel.       Relevant Orders   Hepatic function panel (Completed)   Lipid panel (Completed)   TSH (Completed)   Basic metabolic panel (Completed)   Stress    Increased stress as outlined.  Discussed with her today.  Counseling.  Has good support.  Continue zoloft.  Overall stable.  Follow.        Other Visit Diagnoses    Hyperglycemia       Relevant Orders   Hemoglobin A1c (Completed)   Cervical cancer screening       Relevant Orders   Cytology - PAP( [place])       Einar Pheasant, MD

## 2019-04-19 NOTE — Assessment & Plan Note (Signed)
Physical today 04/19/19.  PAP 04/19/19.  Mammogram 05/28/18.  Mammogram scheduled.

## 2019-04-21 ENCOUNTER — Encounter: Payer: Self-pay | Admitting: Internal Medicine

## 2019-04-21 NOTE — Assessment & Plan Note (Signed)
Saw gyn.  PAP 01/2017 - negative with negative HPV.  Repeat PAP today.

## 2019-04-21 NOTE — Assessment & Plan Note (Signed)
Recheck cbc and ferritin.   

## 2019-04-21 NOTE — Assessment & Plan Note (Signed)
Stable

## 2019-04-21 NOTE — Assessment & Plan Note (Signed)
Increased stress as outlined.  Discussed with her today.  Counseling.  Has good support.  Continue zoloft.  Overall stable.  Follow.

## 2019-04-21 NOTE — Assessment & Plan Note (Signed)
Controlled.  Nexium.

## 2019-04-21 NOTE — Assessment & Plan Note (Signed)
Low cholesterol diet and exercise.  Follow lipid panel.   

## 2019-04-23 LAB — CYTOLOGY - PAP
Comment: NEGATIVE
Diagnosis: NEGATIVE
High risk HPV: NEGATIVE

## 2019-05-29 ENCOUNTER — Ambulatory Visit
Admission: RE | Admit: 2019-05-29 | Discharge: 2019-05-29 | Disposition: A | Discharge status: Home / Self Care | Payer: BC Managed Care – PPO | Source: Ambulatory Visit | Attending: Internal Medicine | Admitting: Internal Medicine

## 2019-05-29 DIAGNOSIS — Z1231 Encounter for screening mammogram for malignant neoplasm of breast: Secondary | ICD-10-CM | POA: Diagnosis not present

## 2019-06-17 ENCOUNTER — Other Ambulatory Visit: Payer: Self-pay | Admitting: Internal Medicine

## 2019-07-17 ENCOUNTER — Other Ambulatory Visit: Payer: Self-pay | Admitting: Internal Medicine

## 2019-07-31 ENCOUNTER — Ambulatory Visit (INDEPENDENT_AMBULATORY_CARE_PROVIDER_SITE_OTHER): Payer: BC Managed Care – PPO | Admitting: Internal Medicine

## 2019-07-31 ENCOUNTER — Other Ambulatory Visit: Payer: Self-pay

## 2019-07-31 DIAGNOSIS — E7800 Pure hypercholesterolemia, unspecified: Secondary | ICD-10-CM

## 2019-07-31 DIAGNOSIS — G4730 Sleep apnea, unspecified: Secondary | ICD-10-CM

## 2019-07-31 DIAGNOSIS — K219 Gastro-esophageal reflux disease without esophagitis: Secondary | ICD-10-CM

## 2019-07-31 DIAGNOSIS — Z9109 Other allergy status, other than to drugs and biological substances: Secondary | ICD-10-CM

## 2019-07-31 DIAGNOSIS — F439 Reaction to severe stress, unspecified: Secondary | ICD-10-CM

## 2019-07-31 NOTE — Progress Notes (Signed)
Patient ID: Melissa Higgins, female   DOB: [birth year], 44 y.o.   MRN: [id number]   Subjective:    Patient ID: Melissa Higgins, female    DOB: Jun 06, 1975, 44 y.o.   MRN: [id number]  HPI This visit occurred during the SARS-CoV-2 public health emergency.  Safety protocols were in place, including screening questions prior to the visit, additional usage of staff PPE, and extensive cleaning of exam room while observing appropriate contact time as indicated for disinfecting solutions.  Patient here for a scheduled follow up. She is doing better.  Teaching summer school.  This is going well.  Tries to stay active.  No chest pain or sob reported.  No abdominal pain or cramping reported.   No acid reflux reported.  Using cpap.  Not as tired during the day.  Seeing an allergist.  Stable.  Handling stress.    Past Medical History:  Diagnosis Date  . C. difficile colitis    augment use  . Depression   . Dysmenorrhea   . Environmental allergies    allergy shots  . History of frequent urinary tract infections   . Hypercholesterolemia   . Sleep apnea    CPAP used at home   Past Surgical History:  Procedure Laterality Date  . COLONOSCOPY WITH PROPOFOL N/A 01/18/2019   Procedure: COLONOSCOPY WITH PROPOFOL;  Surgeon: Jonathon Bellows, MD;  Location: [facility] ENDOSCOPY;  Service: Gastroenterology;  Laterality: N/A;  . NASAL SINUS SURGERY  2003  . UTERINE FIBROID SURGERY  2019   Family History  Problem Relation Age of Onset  . Hyperlipidemia Mother   . Asthma Father   . Hyperlipidemia Father   . Allergies Father   . Breast cancer Neg Hx    Social History   Socioeconomic History  . Marital status: Divorced    Spouse name: Not on file  . Number of children: 0  . Years of education: college  . Highest education level: Not on file  Occupational History  . Not on file  Tobacco Use  . Smoking status: Never Smoker  . Smokeless tobacco: Never Used  Substance and Sexual Activity  . Alcohol use: Yes     Alcohol/week: 0.0 standard drinks    Comment: occasional  . Drug use: No  . Sexual activity: Yes    Birth control/protection: None  Other Topics Concern  . 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   Social Determinants of Health   Financial Resource Strain:   . Difficulty of Paying Living Expenses:   Food Insecurity:   . Worried About Charity fundraiser in the Last Year:   . Arboriculturist in the Last Year:   Transportation Needs:   . Film/video editor (Medical):   Marland Kitchen Lack of Transportation (Non-Medical):   Physical Activity:   . Days of Exercise per Week:   . Minutes of Exercise per Session:   Stress:   . Feeling of Stress :   Social Connections:   . Frequency of Communication with Friends and Family:   . Frequency of Social Gatherings with Friends and Family:   . Attends Religious Services:   . Active Member of Clubs or Organizations:   . Attends Archivist Meetings:   Marland Kitchen Marital Status:     Outpatient Encounter Medications as of 07/31/2019  Medication Sig  . EPINEPHrine 0.3 mg/0.3 mL IJ SOAJ injection   . esomeprazole (  NEXIUM) 40 MG capsule TAKE 1 CAPSULE(40 MG) BY MOUTH TWICE DAILY BEFORE MEALS  . Loratadine 10 MG CAPS Take by mouth.  . pseudoephedrine (SUDAFED) 120 MG 12 hr tablet Take 120 mg by mouth daily.  . sertraline (ZOLOFT) 100 MG tablet TAKE 1 TABLET(100 MG) BY MOUTH DAILY  . valACYclovir (VALTREX) 500 MG tablet Take one tablet daily.   Facility-Administered Encounter Medications as of 07/31/2019  Medication  . betamethasone acetate-betamethasone sodium phosphate (CELESTONE) injection 3 mg  . betamethasone acetate-betamethasone sodium phosphate (CELESTONE) injection 3 mg    Review of Systems  Constitutional: Negative for appetite change and unexpected weight change.  HENT: Negative for congestion and sinus pressure.   Respiratory: Negative for cough, chest tightness and shortness of  breath.   Cardiovascular: Negative for chest pain, palpitations and leg swelling.  Gastrointestinal: Negative for abdominal pain, diarrhea, nausea and vomiting.  Genitourinary: Negative for difficulty urinating and dysuria.  Musculoskeletal: Negative for joint swelling and myalgias.  Skin: Negative for color change and rash.  Neurological: Negative for dizziness, light-headedness and headaches.  Psychiatric/Behavioral: Negative for agitation and dysphoric mood.       Objective:    Physical Exam Vitals reviewed.  Constitutional:      General: She is not in acute distress.    Appearance: Normal appearance.  HENT:     Head: Normocephalic and atraumatic.     Right Ear: External ear normal.     Left Ear: External ear normal.  Eyes:     General: No scleral icterus.       Right eye: No discharge.        Left eye: No discharge.     Conjunctiva/sclera: Conjunctivae normal.  Neck:     Thyroid: No thyromegaly.  Cardiovascular:     Rate and Rhythm: Normal rate and regular rhythm.  Pulmonary:     Effort: No respiratory distress.     Breath sounds: Normal breath sounds. No wheezing.  Abdominal:     General: Bowel sounds are normal.     Palpations: Abdomen is soft.     Tenderness: There is no abdominal tenderness.  Musculoskeletal:        General: No swelling or tenderness.     Cervical back: Neck supple. No tenderness.  Lymphadenopathy:     Cervical: No cervical adenopathy.  Skin:    Findings: No erythema or rash.  Neurological:     Mental Status: She is alert.  Psychiatric:        Mood and Affect: Mood normal.        Behavior: Behavior normal.     BP 108/72   Pulse 89   Temp 98.1 F (36.7 C)   Resp 16   Ht 5\' 10"  (1.778 m)   Wt 251 lb (113.9 kg)   SpO2 98%   BMI 36.01 kg/m  Wt Readings from Last 3 Encounters:  07/31/19 251 lb (113.9 kg)  04/19/19 244 lb (110.7 kg)  01/18/19 245 lb (111.1 kg)     Lab Results  Component Value Date   WBC 8.0 04/19/2019   HGB  13.7 04/19/2019   HCT 40.8 04/19/2019   PLT 301.0 04/19/2019   GLUCOSE 98 04/19/2019   CHOL 240 (H) 04/19/2019   TRIG 154.0 (H) 04/19/2019   HDL 45.00 04/19/2019   LDLCALC 164 (H) 04/19/2019   ALT 13 04/19/2019   AST 18 04/19/2019   NA 138 04/19/2019   K 4.0 04/19/2019   CL 104 04/19/2019   CREATININE 0.79  04/19/2019   BUN 24 (H) 04/19/2019   CO2 25 04/19/2019   TSH 2.14 04/19/2019   HGBA1C 5.6 04/19/2019    MM 3D SCREEN BREAST BILATERAL  Result Date: 05/29/2019 CLINICAL DATA:  Screening. EXAM: DIGITAL SCREENING BILATERAL MAMMOGRAM WITH TOMO AND CAD COMPARISON:  Previous exam(s). ACR Breast Density Category c: The breast tissue is heterogeneously dense, which may obscure small masses. FINDINGS: There are no findings suspicious for malignancy. Images were processed with CAD. IMPRESSION: No mammographic evidence of malignancy. A result letter of this screening mammogram will be mailed directly to the patient. RECOMMENDATION: Screening mammogram in one year. (Code:SM-B-01Y) BI-RADS CATEGORY  1: Negative. Electronically Signed   By: Ammie Ferrier M.D.   On: 05/29/2019 16:54       Assessment & Plan:   Problem List Items Addressed This Visit    Environmental allergies    Stable.       GERD (gastroesophageal reflux disease)    No upper symptoms reported.  On nexium.       Hypercholesterolemia    The 10-year ASCVD risk score Mikey Bussing DC Brooke Bonito., et al., 2013) is: 1.1%   Values used to calculate the score:     Age: 55 years     Sex: Female     Is Non-Hispanic African American: No     Diabetic: No     Tobacco smoker: No     Systolic Blood Pressure: 697 mmHg     Is BP treated: No     HDL Cholesterol: 45 mg/dL     Total Cholesterol: 240 mg/dL  Low cholesterol diet and exercise.  Follow lipid panel.       Sleep apnea    Continue cpap. Uses regularly.       Stress    Overall handling things well. Doing better.  Continue zoloft.  Follow.            Einar Pheasant, MD

## 2019-08-05 ENCOUNTER — Encounter: Payer: Self-pay | Admitting: Internal Medicine

## 2019-08-05 NOTE — Assessment & Plan Note (Signed)
Stable

## 2019-08-05 NOTE — Assessment & Plan Note (Signed)
The 10-year ASCVD risk score Mikey Bussing DC Brooke Bonito., et al., 2013) is: 1.1%   Values used to calculate the score:     Age: 44 years     Sex: Female     Is Non-Hispanic African American: No     Diabetic: No     Tobacco smoker: No     Systolic Blood Pressure: 454 mmHg     Is BP treated: No     HDL Cholesterol: 45 mg/dL     Total Cholesterol: 240 mg/dL  Low cholesterol diet and exercise.  Follow lipid panel.

## 2019-08-05 NOTE — Assessment & Plan Note (Signed)
Overall handling things well. Doing better.  Continue zoloft.  Follow.

## 2019-08-05 NOTE — Assessment & Plan Note (Signed)
No upper symptoms reported.  On nexium.   

## 2019-08-05 NOTE — Assessment & Plan Note (Signed)
Continue cpap. Uses regularly.   

## 2019-08-16 ENCOUNTER — Other Ambulatory Visit: Payer: Self-pay | Admitting: Internal Medicine

## 2019-08-19 ENCOUNTER — Ambulatory Visit: Payer: BC Managed Care – PPO | Admitting: Internal Medicine

## 2019-09-15 ENCOUNTER — Other Ambulatory Visit: Payer: Self-pay | Admitting: Internal Medicine

## 2019-10-12 ENCOUNTER — Other Ambulatory Visit: Payer: Self-pay | Admitting: Internal Medicine

## 2019-10-14 ENCOUNTER — Other Ambulatory Visit: Payer: Self-pay | Admitting: Internal Medicine

## 2019-10-16 ENCOUNTER — Encounter: Payer: Self-pay | Admitting: Internal Medicine

## 2019-10-22 ENCOUNTER — Encounter: Payer: Self-pay | Admitting: Internal Medicine

## 2019-11-14 ENCOUNTER — Telehealth: Payer: Self-pay | Admitting: Internal Medicine

## 2019-11-14 ENCOUNTER — Other Ambulatory Visit: Payer: Self-pay | Admitting: Internal Medicine

## 2019-11-14 NOTE — Telephone Encounter (Signed)
Melissa Higgins sent me a phone message regarding a dermatology referral.  She has an appt with [facility] at the end of November for persistent rash - face - she feels is related to wearing a mask.  She sent a message asking if could get an earlier appt.  Do you know if there is a dermatologist in town - or if  skin center can see earlier? Do I need to place order for referral (or just let me know what I need to do).  Thanks for your help.

## 2019-11-15 ENCOUNTER — Telehealth: Payer: Self-pay | Admitting: Internal Medicine

## 2019-11-15 NOTE — Telephone Encounter (Signed)
Good morning!  I had to call for another pt a lot of the dermatologist are scheduling out to next year. I will call and see if I can get a sooner appt for her or I can ask for her to put on a cancellation list.

## 2019-11-15 NOTE — Telephone Encounter (Signed)
I left vm at [facility].

## 2019-11-15 NOTE — Telephone Encounter (Signed)
Thank you for your help.

## 2019-11-20 NOTE — Telephone Encounter (Signed)
I left vm at [facility] for a sooner appt for pt.

## 2019-11-20 NOTE — Telephone Encounter (Signed)
Good morning!  FYI- I called back to [facility] and left another vm.

## 2019-11-20 NOTE — Telephone Encounter (Signed)
Thank you :)

## 2019-11-21 NOTE — Telephone Encounter (Signed)
Ok.  Thank you.  Is pt aware of this or do we need to call her?

## 2019-11-21 NOTE — Telephone Encounter (Signed)
I called pt and informed pt about no sooner appt and that she's on the cancellation list. Pt understood.

## 2019-11-21 NOTE — Telephone Encounter (Signed)
I was able to speak to someone at [facility] no sooner appt available pt is on a cancellation list.

## 2019-12-10 ENCOUNTER — Other Ambulatory Visit: Payer: Self-pay

## 2019-12-10 ENCOUNTER — Encounter: Payer: Self-pay | Admitting: Dermatology

## 2019-12-10 ENCOUNTER — Ambulatory Visit: Payer: BC Managed Care – PPO | Admitting: Dermatology

## 2019-12-10 DIAGNOSIS — L219 Seborrheic dermatitis, unspecified: Secondary | ICD-10-CM

## 2019-12-10 DIAGNOSIS — B079 Viral wart, unspecified: Secondary | ICD-10-CM

## 2019-12-10 DIAGNOSIS — H019 Unspecified inflammation of eyelid: Secondary | ICD-10-CM

## 2019-12-10 DIAGNOSIS — L710 Perioral dermatitis: Secondary | ICD-10-CM

## 2019-12-10 DIAGNOSIS — L814 Other melanin hyperpigmentation: Secondary | ICD-10-CM

## 2019-12-10 DIAGNOSIS — D1800 Hemangioma unspecified site: Secondary | ICD-10-CM

## 2019-12-10 DIAGNOSIS — L280 Lichen simplex chronicus: Secondary | ICD-10-CM | POA: Diagnosis not present

## 2019-12-10 DIAGNOSIS — D229 Melanocytic nevi, unspecified: Secondary | ICD-10-CM

## 2019-12-10 DIAGNOSIS — L578 Other skin changes due to chronic exposure to nonionizing radiation: Secondary | ICD-10-CM

## 2019-12-10 DIAGNOSIS — L821 Other seborrheic keratosis: Secondary | ICD-10-CM

## 2019-12-10 MED ORDER — HYDROCORTISONE 2.5 % EX CREA: TOPICAL_CREAM | Freq: Two times a day (BID) | CUTANEOUS | 1 refills | Status: DC

## 2019-12-10 MED ORDER — DOXYCYCLINE HYCLATE 20 MG PO TABS: 20.0000 mg | ORAL_TABLET | Freq: Two times a day (BID) | ORAL | 2 refills | Status: AC

## 2019-12-10 NOTE — Patient Instructions (Addendum)
Melanoma ABCDEs  Melanoma is the most dangerous type of skin cancer, and is the leading cause of death from skin disease.  You are more likely to develop melanoma if you:  Have light-colored skin, light-colored eyes, or red or blond hair  Spend a lot of time in the sun  Tan regularly, either outdoors or in a tanning bed  Have had blistering sunburns, especially during childhood  Have a close family member who has had a melanoma  Have atypical moles or large birthmarks  Early detection of melanoma is key since treatment is typically straightforward and cure rates are extremely high if we catch it early.   The first sign of melanoma is often a change in a mole or a new dark spot.  The ABCDE system is a way of remembering the signs of melanoma.  A for asymmetry:  The two halves do not match. B for border:  The edges of the growth are irregular. C for color:  A mixture of colors are present instead of an even brown color. D for diameter:  Melanomas are usually (but not always) greater than 8mm - the size of a pencil eraser. E for evolution:  The spot keeps changing in size, shape, and color.  Please check your skin once per month between visits. You can use a small mirror in front and a large mirror behind you to keep an eye on the back side or your body.   If you see any new or changing lesions before your next follow-up, please call to schedule a visit.  Please continue daily skin protection including broad spectrum sunscreen SPF 30+ to sun-exposed areas, reapplying every 2 hours as needed when you're outdoors.   Doxycycline should be taken with food to prevent nausea. Do not lay down for 30 minutes after taking. Be cautious with sun exposure and use good sun protection while on this medication. Pregnant women should not take this medication.   Start HC 2.5% cream twice daily up to 1 week at a time to left eyelid, alar crease and eyebrows. Avoid applying to other areas at face due to  risk of flaring perioral dermatitis.   Topical steroids (such as triamcinolone, fluocinolone, fluocinonide, mometasone, clobetasol, halobetasol, betamethasone, hydrocortisone) can cause thinning and lightening of the skin if they are used for too long in the same area. Your physician has selected the right strength medicine for your problem and area affected on the body. Please use your medication only as directed by your physician to prevent side effects.   Recommend taking Heliocare sun protection supplement daily in sunny weather for additional sun protection. For maximum protection on the sunniest days, you can take up to 2 capsules of regular Heliocare OR take 1 capsule of Heliocare Ultra. For prolonged exposure (such as a full day in the sun), you can repeat your dose of the supplement 4 hours after your first dose. Heliocare can be purchased at [facility] or at [email or web address].   Instructions for After In-Office Application of Cantharidin  1. This is a strong medicine; please follow ALL instructions.  2. Gently wash off with soap and water in four hours or sooner s directed by your physician.  3. **WARNING** this medicine can cause severe blistering, blood blisters, infection, and/or scarring if it is not washed off as directed.  4. Your progress will be rechecked in 1-2 months; call sooner if there are any questions or problems.  Cantharidin is a blistering agent that comes from  a beetle.  It needs to be washed off in about 4 hours after application.  Although it is painless when applied in office, it may cause symptoms of mild pain and burning several hours later.  Treated areas will swell and turn red, and blisters may form.  Vaseline and a bandaid may be applied until wound has healed.  Once healed, the skin may remain temporarily discolored.  It can take weeks to months for pigmentation to return to normal.  The molluscum may resolve with this topical treatment, but often,  additional treatments may be required to clear molluscum.  It is recommended to keep the skin well-moisturized and avoid scratching affected area to help prevent spread of the molluscum.

## 2019-12-10 NOTE — Progress Notes (Signed)
Follow-Up Visit   Subjective  Melissa Higgins is a 44 y.o. female who presents for the following: FBSE.  Patient here for full body skin exam and skin cancer screening. No personal history of skin cancer, family history of skin cancer in patient's uncle but patient unsure what type.  She is not aware of any new or changing spots but does have some acne at nose, lower face and below left eye. Her allergist gave her mometasone to use 1 week at a time and it did help.  There is a blister that has been on her left palm since August that has dried up but won't go away.  The following portions of the chart were reviewed this encounter and updated as appropriate:  Tobacco  Allergies  Meds  Problems  Med Hx  Surg Hx  Fam Hx      Review of Systems:  No other skin or systemic complaints except as noted in HPI or Assessment and Plan.  Objective  Well appearing patient in no apparent distress; mood and affect are within normal limits.  A full examination was performed including scalp, head, eyes, ears, nose, lips, neck, chest, axillae, abdomen, back, buttocks, bilateral upper extremities, bilateral lower extremities, hands, feet, fingers, toes, fingernails, and toenails. All findings within normal limits unless otherwise noted below.  Objective  perioral: Scattered inflammatory papules periorificial.   Objective  Left palm: Ill-defined thick lichenified plaque  Objective  Right Ankle x 1, right lateral calf x 2, right 5th finger x 1 (4): Verrucous papules  Objective  scalp and alar crease: Scaly erythematous plaques at the alar creases Scalp with scale  Objective  Left Eyelid: Scaly erythematous papules and plaque   Assessment & Plan  Perioral dermatitis perioral  Reviewed that this typically requires an oral antibiotic to clear.  Advised that topical steroids can trigger or worsen this condition.    Start doxycycline 20mg  twice daily with food.   Doxycycline  should be taken with food to prevent nausea. Do not lay down for 30 minutes after taking. Be cautious with sun exposure and use good sun protection while on this medication. Pregnant women should not take this medication.    Ordered Medications: doxycycline (PERIOSTAT) 20 MG tablet  Lichen simplex chronicus Left palm  Start mometasone once daily and cover with band aid for 3-4 weeks.   Avoid rubbing or scratching  Topical steroids (such as triamcinolone, fluocinolone, fluocinonide, mometasone, clobetasol, halobetasol, betamethasone, hydrocortisone) can cause thinning and lightening of the skin if they are used for too long in the same area. Your physician has selected the right strength medicine for your problem and area affected on the body. Please use your medication only as directed by your physician to prevent side effects.    Viral warts, unspecified type (4) Right Ankle x 1, right lateral calf x 2, right 5th finger x 1  Discussed viral etiology and risk of spread.  Discussed multiple treatments may be required to clear warts.  Discussed possible post-treatment dyspigmentation and risk of recurrence.  Cantharidin is a blistering agent that comes from a beetle.  It needs to be washed off in about 4 hours after application.  Although it is painless when applied in office, it may cause symptoms of mild pain and burning several hours later.  Treated areas will swell and turn red, and blisters may form.  Vaseline and a bandaid may be applied until wound has healed.  Once healed, the skin may remain temporarily  discolored.  It can take weeks to months for pigmentation to return to normal.    Destruction of lesion - Right Ankle x 1, right lateral calf x 2, right 5th finger x 1  Destruction method: chemical removal   Informed consent: discussed and consent obtained   Timeout:  patient name, date of birth, surgical site, and procedure verified Chemical destruction method: cantharidin     Application time:  4 hours Procedure instructions: patient instructed to wash and dry area   Outcome: patient tolerated procedure well with no complications   Post-procedure details: wound care instructions given    Seborrheic dermatitis scalp and alar crease  Chronic condition, no cure, only control. Currently flared.  Chronic condition, no cure, only control. Currently flared at face. Scalp is not bothersome.  Start HC 2.5% cream twice daily up to 1 week at a time to alar crease and eyebrows. Avoid applying to other areas at face due to risk of flaring perioral dermatitis.   Topical steroids (such as triamcinolone, fluocinolone, fluocinonide, mometasone, clobetasol, halobetasol, betamethasone, hydrocortisone) can cause thinning and lightening of the skin if they are used for too long in the same area. Your physician has selected the right strength medicine for your problem and area affected on the body. Please use your medication only as directed by your physician to prevent side effects.    Dermatitis of eyelid of left eye, unspecified type Left Eyelid  Start HC 2.5% cream twice daily up to 1 week as needed for rash.   Topical steroids (such as triamcinolone, fluocinolone, fluocinonide, mometasone, clobetasol, halobetasol, betamethasone, hydrocortisone) can cause thinning and lightening of the skin if they are used for too long in the same area. Your physician has selected the right strength medicine for your problem and area affected on the body. Please use your medication only as directed by your physician to prevent side effects.    Ordered Medications: hydrocortisone 2.5 % cream   Lentigines - Scattered tan macules - Discussed due to sun exposure - Benign, observe - Call for any changes  Seborrheic Keratoses - Stuck-on, waxy, tan-brown papules and plaques  - Discussed benign etiology and prognosis. - Observe - Call for any changes  Melanocytic Nevi - Tan-brown and/or  pink-flesh-colored symmetric macules and papules - Benign appearing on exam today - Observation - Call clinic for new or changing moles - Recommend daily use of broad spectrum spf 30+ sunscreen to sun-exposed areas.   Hemangiomas - Red papules - Discussed benign nature - Observe - Call for any changes  Actinic Damage - Chronic, secondary to cumulative UV/sun exposure - diffuse scaly erythematous macules with underlying dyspigmentation - Recommend daily broad spectrum sunscreen SPF 30+ to sun-exposed areas, reapply every 2 hours as needed.  - Call for new or changing lesions.  Skin cancer screening performed today.   Return in about 5 weeks (around 01/14/2020) for perioral dermatitis, warts.  Graciella Belton, RMA, am acting as scribe for Forest Gleason, MD .  Documentation: I have reviewed the above documentation for accuracy and completeness, and I agree with the above.  Forest Gleason, MD

## 2019-12-14 ENCOUNTER — Other Ambulatory Visit: Payer: Self-pay | Admitting: Internal Medicine

## 2020-01-13 ENCOUNTER — Other Ambulatory Visit: Payer: Self-pay | Admitting: Internal Medicine

## 2020-01-22 ENCOUNTER — Ambulatory Visit: Payer: BC Managed Care – PPO | Admitting: Dermatology

## 2020-01-22 ENCOUNTER — Other Ambulatory Visit: Payer: Self-pay

## 2020-01-22 DIAGNOSIS — B078 Other viral warts: Secondary | ICD-10-CM

## 2020-01-22 DIAGNOSIS — L710 Perioral dermatitis: Secondary | ICD-10-CM | POA: Diagnosis not present

## 2020-01-22 MED ORDER — DOXYCYCLINE HYCLATE 20 MG PO TABS: 20.0000 mg | ORAL_TABLET | Freq: Two times a day (BID) | ORAL | 2 refills | Status: AC

## 2020-01-22 NOTE — Patient Instructions (Addendum)
Doxycycline should be taken with food to prevent nausea. Do not lay down for 30 minutes after taking. Be cautious with sun exposure and use good sun protection while on this medication. Pregnant women should not take this medication.   Instructions for After In-Office Application of Cantharidin  1. This is a strong medicine; please follow ALL instructions.  2. Gently wash off with soap and water in four hours or sooner s directed by your physician.  3. **WARNING** this medicine can cause severe blistering, blood blisters, infection, and/or scarring if it is not washed off as directed.  4. Your progress will be rechecked in 1-2 months; call sooner if there are any questions or problems.  Cantharidin is a blistering agent that comes from a beetle.  It needs to be washed off in about 4 hours after application.  Although it is painless when applied in office, it may cause symptoms of mild pain and burning several hours later.  Treated areas will swell and turn red, and blisters may form.  Vaseline and a bandaid may be applied until wound has healed.  Once healed, the skin may remain temporarily discolored.  It can take weeks to months for pigmentation to return to normal.  The molluscum may resolve with this topical treatment, but often, additional treatments may be required to clear molluscum.  It is recommended to keep the skin well-moisturized and avoid scratching affected area to help prevent spread of the molluscum.

## 2020-01-22 NOTE — Progress Notes (Unsigned)
   Follow-Up Visit   Subjective  Melissa Higgins is a 45 y.o. female who presents for the following: Follow-up (Patient here for 5 week follow up for warts and perioral dermatitis. She is taking doxycycline 20mg  twice daily and advises dermatitis has resolved. Warts at right ankle, right lateral calf and right 5th finger were treated with cantharidin and have resolved per patient. ).  The following portions of the chart were reviewed this encounter and updated as appropriate:   Tobacco  Allergies  Meds  Problems  Med Hx  Surg Hx  Fam Hx      Review of Systems:  No other skin or systemic complaints except as noted in HPI or Assessment and Plan.  Objective  Well appearing patient in no apparent distress; mood and affect are within normal limits.  A focused examination was performed including face, right leg, right hand and fingers. Relevant physical exam findings are noted in the Assessment and Plan.  Objective  Face: One inflammatory papule at chin, one at left cheek  Objective  Right 5th finger, right lateral calf x 2, right ankle (4): Verrucous papules -- Discussed viral etiology and contagion.    Assessment & Plan  Perioral dermatitis Face  Continue doxycycline 20mg  twice daily with food until clear for one month, then decrease to once daily for 2 weeks.  Doxycycline should be taken with food to prevent nausea. Do not lay down for 30 minutes after taking. Be cautious with sun exposure and use good sun protection while on this medication. Pregnant women should not take this medication.    Ordered Medications: doxycycline (PERIOSTAT) 20 MG tablet  Other viral warts (4) Right 5th finger, right lateral calf x 2, right ankle  Discussed viral etiology and risk of spread.  Discussed multiple treatments may be required to clear warts.  Discussed possible post-treatment dyspigmentation and risk of recurrence.  Cantharidin is a blistering agent that comes from a beetle.   It needs to be washed off in about 4 hours after application.  Although it is painless when applied in office, it may cause symptoms of mild pain and burning several hours later.  Treated areas will swell and turn red, and blisters may form.  Vaseline and a bandaid may be applied until wound has healed.  Once healed, the skin may remain temporarily discolored.  It can take weeks to months for pigmentation to return to normal.    Destruction of lesion - Right 5th finger, right lateral calf x 2, right ankle  Destruction method: chemical removal   Informed consent: discussed and consent obtained   Timeout:  patient name, date of birth, surgical site, and procedure verified Chemical destruction method: cantharidin   Application time:  4 hours Procedure instructions: patient instructed to wash and dry area   Outcome: patient tolerated procedure well with no complications   Post-procedure details: wound care instructions given    Return for Wart 4-6 weeks.  Graciella Belton, RMA, am acting as scribe for Forest Gleason, MD .  Documentation: I have reviewed the above documentation for accuracy and completeness, and I agree with the above.  Forest Gleason, MD

## 2020-01-23 ENCOUNTER — Encounter: Payer: Self-pay | Admitting: Dermatology

## 2020-02-12 ENCOUNTER — Other Ambulatory Visit: Payer: Self-pay | Admitting: Internal Medicine

## 2020-02-21 ENCOUNTER — Ambulatory Visit: Payer: BC Managed Care – PPO | Admitting: Podiatry

## 2020-02-21 ENCOUNTER — Encounter: Payer: Self-pay | Admitting: Podiatry

## 2020-02-21 ENCOUNTER — Ambulatory Visit (INDEPENDENT_AMBULATORY_CARE_PROVIDER_SITE_OTHER): Payer: BC Managed Care – PPO

## 2020-02-21 ENCOUNTER — Other Ambulatory Visit: Payer: Self-pay

## 2020-02-21 DIAGNOSIS — M722 Plantar fascial fibromatosis: Secondary | ICD-10-CM | POA: Diagnosis not present

## 2020-02-21 MED ORDER — MELOXICAM 15 MG PO TABS: 15.0000 mg | ORAL_TABLET | Freq: Every day | ORAL | 1 refills | Status: DC

## 2020-02-21 MED ORDER — METHYLPREDNISOLONE 4 MG PO TBPK: ORAL_TABLET | ORAL | 0 refills | Status: DC

## 2020-02-21 MED ORDER — BETAMETHASONE SOD PHOS & ACET 6 (3-3) MG/ML IJ SUSP
3.0000 mg | Freq: Once | INTRAMUSCULAR | Status: AC
  Administered 2020-02-21: 3 mg via INTRA_ARTICULAR

## 2020-02-21 NOTE — Progress Notes (Signed)
   Subjective: 45 y.o. female presenting today as a reestablish new patient for evaluation of right heel pain that began approximately 1 month ago.  Patient states that she did change her activity in shoes which may have aggravated her heel pain.  She has some very mild achiness to the left heel.  She presents for further treatment and evaluation.  She currently is not on anything for treatment   Past Medical History:  Diagnosis Date  . C. difficile colitis    augment use  . Depression   . Dysmenorrhea   . Environmental allergies    allergy shots  . History of frequent urinary tract infections   . Hypercholesterolemia   . Sleep apnea    CPAP used at home     Objective: Physical Exam General: The patient is alert and oriented x3 in no acute distress.  Dermatology: Skin is warm, dry and supple bilateral lower extremities. Negative for open lesions or macerations bilateral.   Vascular: Dorsalis Pedis and Posterior Tibial pulses palpable bilateral.  Capillary fill time is immediate to all digits.  Neurological: Epicritic and protective threshold intact bilateral.   Musculoskeletal: Tenderness to palpation to the plantar aspect of the right heel along the plantar fascia greater than the left. All other joints range of motion within normal limits bilateral. Strength 5/5 in all groups bilateral.   Radiographic exam: Normal osseous mineralization. Joint spaces preserved. No fracture/dislocation/boney destruction. No other soft tissue abnormalities or radiopaque foreign bodies.   Assessment: 1. Plantar fasciitis bilateral RT > LT  Plan of Care:  1. Patient evaluated. Xrays reviewed.   2. Injection of 0.5cc Celestone soluspan injected into the right plantar fascia  3. Rx for Medrol Dose Pack placed 4. Rx for Meloxicam ordered for patient. 5.  Continue wearing good supportive shoes.  Recommend [employer]  6. Instructed patient regarding therapies and modalities at home  to alleviate symptoms.  7. Return to clinic in 4 weeks.     Edrick Kins, DPM [facility]  Dr. Edrick Kins, DPM    [address], [place] [address]                Office [phone]  Fax [phone]

## 2020-02-25 ENCOUNTER — Encounter: Payer: Self-pay | Admitting: Internal Medicine

## 2020-02-27 ENCOUNTER — Ambulatory Visit: Payer: BC Managed Care – PPO | Admitting: Dermatology

## 2020-03-05 ENCOUNTER — Ambulatory Visit: Payer: BC Managed Care – PPO | Admitting: Internal Medicine

## 2020-03-05 ENCOUNTER — Encounter: Payer: Self-pay | Admitting: Internal Medicine

## 2020-03-05 ENCOUNTER — Ambulatory Visit: Payer: BC Managed Care – PPO | Admitting: Dermatology

## 2020-03-05 ENCOUNTER — Other Ambulatory Visit: Payer: Self-pay

## 2020-03-05 VITALS — BP 110/62 | HR 76 | Temp 97.8°F | Resp 16 | Ht 70.0 in | Wt 272.0 lb

## 2020-03-05 DIAGNOSIS — R5383 Other fatigue: Secondary | ICD-10-CM | POA: Insufficient documentation

## 2020-03-05 DIAGNOSIS — G4730 Sleep apnea, unspecified: Secondary | ICD-10-CM

## 2020-03-05 DIAGNOSIS — B078 Other viral warts: Secondary | ICD-10-CM

## 2020-03-05 DIAGNOSIS — Z13220 Encounter for screening for lipoid disorders: Secondary | ICD-10-CM

## 2020-03-05 DIAGNOSIS — L710 Perioral dermatitis: Secondary | ICD-10-CM

## 2020-03-05 DIAGNOSIS — E7800 Pure hypercholesterolemia, unspecified: Secondary | ICD-10-CM

## 2020-03-05 DIAGNOSIS — K219 Gastro-esophageal reflux disease without esophagitis: Secondary | ICD-10-CM

## 2020-03-05 DIAGNOSIS — R739 Hyperglycemia, unspecified: Secondary | ICD-10-CM

## 2020-03-05 DIAGNOSIS — D649 Anemia, unspecified: Secondary | ICD-10-CM | POA: Diagnosis not present

## 2020-03-05 DIAGNOSIS — K130 Diseases of lips: Secondary | ICD-10-CM | POA: Diagnosis not present

## 2020-03-05 DIAGNOSIS — F439 Reaction to severe stress, unspecified: Secondary | ICD-10-CM

## 2020-03-05 LAB — COMPREHENSIVE METABOLIC PANEL
ALT: 13 U/L (ref 0–35)
AST: 15 U/L (ref 0–37)
Albumin: 3.6 g/dL (ref 3.5–5.2)
Alkaline Phosphatase: 90 U/L (ref 39–117)
BUN: 18 mg/dL (ref 6–23)
CO2: 26 mEq/L (ref 19–32)
Calcium: 8.6 mg/dL (ref 8.4–10.5)
Chloride: 104 mEq/L (ref 96–112)
Creatinine, Ser: 0.73 mg/dL (ref 0.40–1.20)
GFR: 99.06 mL/min (ref >60.00)
Glucose, Bld: 88 mg/dL (ref 70–99)
Potassium: 4.4 mEq/L (ref 3.5–5.1)
Sodium: 138 mEq/L (ref 135–145)
Total Bilirubin: 0.5 mg/dL (ref 0.2–1.2)
Total Protein: 6.1 g/dL (ref 6.0–8.3)

## 2020-03-05 LAB — CBC WITH DIFFERENTIAL/PLATELET
Basophils Absolute: 0.1 10*3/uL (ref 0.0–0.1)
Basophils Relative: 0.6 % (ref 0.0–3.0)
Eosinophils Absolute: 0.2 10*3/uL (ref 0.0–0.7)
Eosinophils Relative: 2.1 % (ref 0.0–5.0)
HCT: 38.1 % (ref 36.0–46.0)
Hemoglobin: 12.6 g/dL (ref 12.0–15.0)
Lymphocytes Relative: 29 % (ref 12.0–46.0)
Lymphs Abs: 2.7 10*3/uL (ref 0.7–4.0)
MCHC: 33.1 g/dL (ref 30.0–36.0)
MCV: 92.8 fl (ref 78.0–100.0)
Monocytes Absolute: 0.5 10*3/uL (ref 0.1–1.0)
Monocytes Relative: 5.7 % (ref 3.0–12.0)
Neutro Abs: 5.9 10*3/uL (ref 1.4–7.7)
Neutrophils Relative %: 62.6 % (ref 43.0–77.0)
Platelets: 276 10*3/uL (ref 150.0–400.0)
RBC: 4.11 Mil/uL (ref 3.87–5.11)
RDW: 13 % (ref 11.5–15.5)
WBC: 9.4 10*3/uL (ref 4.0–10.5)

## 2020-03-05 LAB — LIPID PANEL
Cholesterol: 215 mg/dL — ABNORMAL HIGH (ref 0–200)
HDL: 43 mg/dL (ref >39.00)
LDL Cholesterol: 140 mg/dL — ABNORMAL HIGH (ref 0–99)
NonHDL: 171.83
Total CHOL/HDL Ratio: 5
Triglycerides: 161 mg/dL — ABNORMAL HIGH (ref 0.0–149.0)
VLDL: 32.2 mg/dL (ref 0.0–40.0)

## 2020-03-05 LAB — TSH: TSH: 2.14 u[IU]/mL (ref 0.35–4.50)

## 2020-03-05 LAB — HEMOGLOBIN A1C: Hgb A1c MFr Bld: 5.7 % (ref 4.6–6.5)

## 2020-03-05 MED ORDER — HYDROCORTISONE 2.5 % EX OINT: TOPICAL_OINTMENT | Freq: Two times a day (BID) | CUTANEOUS | 1 refills | Status: DC

## 2020-03-05 NOTE — Progress Notes (Signed)
ePatient ID: Melissa Higgins, female   DOB: 1975-10-12, 45 y.o.   MRN: [id number]   Subjective:    Patient ID: Melissa Higgins, female    DOB: 09/07/1975, 45 y.o.   MRN: [id number]  HPI This visit occurred during the SARS-CoV-2 public health emergency.  Safety protocols were in place, including screening questions prior to the visit, additional usage of staff PPE, and extensive cleaning of exam room while observing appropriate contact time as indicated for disinfecting solutions.  Patient here for scheduled follow up.  Here to follow up regarding increased stress and fatigue.  Grandfather passed away recently. Discussed with her today.  Seeing a Social worker.   Was previously seeing Dr Nicolasa Ducking.  On zoloft.   Would like to get reestablished with Dr Nicolasa Ducking.  Stress eating.  Has gained weight.  Increased fatigue.  Using cpap.  Not sure if settings still appropriate settings.  Discussed referral back to pulmonary.  No chest pain or sob reported.  No abdominal pain.  Bowels moving.     Past Medical History:  Diagnosis Date   C. difficile colitis    augment use   Depression    Dysmenorrhea    Environmental allergies    allergy shots   History of frequent urinary tract infections    Hypercholesterolemia    Sleep apnea    CPAP used at home   Past Surgical History:  Procedure Laterality Date   COLONOSCOPY WITH PROPOFOL N/A 01/18/2019   Procedure: COLONOSCOPY WITH PROPOFOL;  Surgeon: Jonathon Bellows, MD;  Location: [facility] ENDOSCOPY;  Service: Gastroenterology;  Laterality: N/A;   NASAL SINUS SURGERY  2003   UTERINE FIBROID SURGERY  2019   Family History  Problem Relation Age of Onset   Hyperlipidemia Mother    Asthma Father    Hyperlipidemia Father    Allergies Father    Breast cancer Neg Hx    Social History   Socioeconomic History   Marital status: Divorced    Spouse name: Not on file   Number of children: 0   Years of education: college   Highest education level: Not on  file  Occupational History   Not on file  Tobacco Use   Smoking status: Never Smoker   Smokeless tobacco: Never Used  Substance and Sexual Activity   Alcohol use: Yes    Alcohol/week: 0.0 standard drinks    Comment: occasional   Drug use: No   Sexual activity: Yes    Birth control/protection: None  Other Topics Concern   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   Social Determinants of Health   Financial Resource Strain: Not on file  Food Insecurity: Not on file  Transportation Needs: Not on file  Physical Activity: Not on file  Stress: Not on file  Social Connections: Not on file    Outpatient Encounter Medications as of 03/05/2020  Medication Sig   EPINEPHrine 0.3 mg/0.3 mL IJ SOAJ injection    esomeprazole (NEXIUM) 40 MG capsule TAKE 1 CAPSULE(40 MG) BY MOUTH TWICE DAILY BEFORE MEALS   Loratadine 10 MG CAPS Take by mouth.   meloxicam (MOBIC) 15 MG tablet Take 1 tablet (15 mg total) by mouth daily.   methylPREDNISolone (MEDROL DOSEPAK) 4 MG TBPK tablet 6 day dose pack - take as directed   pseudoephedrine (SUDAFED) 120 MG 12 hr tablet Take 120 mg by mouth daily.   sertraline (ZOLOFT) 100 MG tablet TAKE  1 TABLET(100 MG) BY MOUTH DAILY   valACYclovir (VALTREX) 500 MG tablet TAKE 1 TABLET BY MOUTH DAILY   Facility-Administered Encounter Medications as of 03/05/2020  Medication   betamethasone acetate-betamethasone sodium phosphate (CELESTONE) injection 3 mg   betamethasone acetate-betamethasone sodium phosphate (CELESTONE) injection 3 mg    Review of Systems  Constitutional: Negative for appetite change and unexpected weight change.  HENT: Negative for congestion and sinus pressure.   Respiratory: Negative for cough, chest tightness and shortness of breath.   Cardiovascular: Negative for chest pain, palpitations and leg swelling.  Gastrointestinal: Negative for abdominal pain, diarrhea, nausea and  vomiting.  Genitourinary: Negative for difficulty urinating and dysuria.  Musculoskeletal: Negative for joint swelling and myalgias.  Skin: Negative for color change and rash.  Neurological: Negative for dizziness, light-headedness and headaches.  Psychiatric/Behavioral: Negative for agitation and dysphoric mood.       Increased stress as outlined.         Objective:    Physical Exam Constitutional:      General: She is not in acute distress.    Appearance: Normal appearance.  HENT:     Head: Normocephalic and atraumatic.     Right Ear: External ear normal.     Left Ear: External ear normal.     Mouth/Throat:     Mouth: Oropharynx is clear and moist.  Eyes:     General: No scleral icterus.       Right eye: No discharge.        Left eye: No discharge.     Conjunctiva/sclera: Conjunctivae normal.  Neck:     Thyroid: No thyromegaly.  Cardiovascular:     Rate and Rhythm: Normal rate and regular rhythm.  Pulmonary:     Effort: No respiratory distress.     Breath sounds: Normal breath sounds. No wheezing.  Abdominal:     General: Bowel sounds are normal.     Palpations: Abdomen is soft.     Tenderness: There is no abdominal tenderness.  Musculoskeletal:        General: No swelling, tenderness or edema.     Cervical back: Neck supple. No tenderness.  Lymphadenopathy:     Cervical: No cervical adenopathy.  Skin:    Findings: No erythema or rash.  Neurological:     Mental Status: She is alert.  Psychiatric:        Mood and Affect: Mood normal.        Behavior: Behavior normal.     BP 110/62    Pulse 76    Temp 97.8 F (36.6 C) (Oral)    Resp 16    Ht 5' 10" (1.778 m)    Wt 272 lb (123.4 kg)    SpO2 98%    BMI 39.03 kg/m  Wt Readings from Last 3 Encounters:  03/05/20 272 lb (123.4 kg)  07/31/19 251 lb (113.9 kg)  04/19/19 244 lb (110.7 kg)     Lab Results  Component Value Date   WBC 9.4 03/05/2020   HGB 12.6 03/05/2020   HCT 38.1 03/05/2020   PLT 276.0  03/05/2020   GLUCOSE 88 03/05/2020   CHOL 215 (H) 03/05/2020   TRIG 161.0 (H) 03/05/2020   HDL 43.00 03/05/2020   LDLCALC 140 (H) 03/05/2020   ALT 13 03/05/2020   AST 15 03/05/2020   NA 138 03/05/2020   K 4.4 03/05/2020   CL 104 03/05/2020   CREATININE 0.73 03/05/2020   BUN 18 03/05/2020   CO2 26 03/05/2020  TSH 2.14 03/05/2020   HGBA1C 5.7 03/05/2020    MM 3D SCREEN BREAST BILATERAL  Result Date: 05/29/2019 CLINICAL DATA:  Screening. EXAM: DIGITAL SCREENING BILATERAL MAMMOGRAM WITH TOMO AND CAD COMPARISON:  Previous exam(s). ACR Breast Density Category c: The breast tissue is heterogeneously dense, which may obscure small masses. FINDINGS: There are no findings suspicious for malignancy. Images were processed with CAD. IMPRESSION: No mammographic evidence of malignancy. A result letter of this screening mammogram will be mailed directly to the patient. RECOMMENDATION: Screening mammogram in one year. (Code:SM-B-01Y) BI-RADS CATEGORY  1: Negative. Electronically Signed   By: Ammie Ferrier M.D.   On: 05/29/2019 16:54       Assessment & Plan:   Problem List Items Addressed This Visit    Anemia    Follow cbc.       Fatigue    Increased fatigue.  May be multifactorial.  Check labs, including cbc, met c and tsh.  Have pulmonary evaluate/reassess sleep apnea and cpap settings.        Relevant Orders   Comprehensive metabolic panel (Completed)   CBC with Differential/Platelet (Completed)   TSH (Completed)   GERD (gastroesophageal reflux disease)    No upper symptoms reported.  On nexium.       Hypercholesterolemia - Primary    Low cholesterol diet and exercise.  Follow lipid panel.       Relevant Orders   Lipid panel (Completed)   Hyperglycemia    Low carb diet and exercise.  Follow met b and a1c.       Relevant Orders   Hemoglobin A1c (Completed)   Sleep apnea    Using cpap.  Increased fatigue.  Has gained weight.  Not sure if settings are accurate.  Refer back  to pulmonary for reevaluation.        Relevant Orders   Ambulatory referral to Pulmonology   Stress    Increased stress as outlined.  On zoloft.  Seeing a Social worker.  Interested in reevaluation with Dr Nicolasa Ducking.  May need medication adjustment.  Follow.  Contact Dr Nicolasa Ducking about appt.         Other Visit Diagnoses    Screening cholesterol level           Einar Pheasant, MD

## 2020-03-05 NOTE — Patient Instructions (Addendum)
Continue doxycycline 20mg  decreasing to once daily for 2 weeks then d/c if staying clear.   Doxycycline should be taken with food to prevent nausea. Do not lay down for 30 minutes after taking. Be cautious with sun exposure and use good sun protection while on this medication. Pregnant women should not take this medication.    Start hydrocortisone 2.5% ointment twice daily for up to 1 week.  Continue Aquaphor and avoid lip balms containing beeswax.

## 2020-03-05 NOTE — Progress Notes (Signed)
   Follow-Up Visit   Subjective  Melissa Higgins is a 45 y.o. female who presents for the following: Follow-up (Patient here today for wart and perioral dermatitis follow up. She is taking doxycycline 20mg  BID and feels like she has improved with an occasional bump. Warts have been treated with cantharidin. Patient also complains of very dry lips the past week. ).  The following portions of the chart were reviewed this encounter and updated as appropriate:   Tobacco  Allergies  Meds  Problems  Med Hx  Surg Hx  Fam Hx      Review of Systems:  No other skin or systemic complaints except as noted in HPI or Assessment and Plan.  Objective  Well appearing patient in no apparent distress; mood and affect are within normal limits.  A focused examination was performed including face, hands, legs. Relevant physical exam findings are noted in the Assessment and Plan.  Objective  perioral: clear  Objective  lips: Scale and erythema mucosal lip  Objective  right ankle, right calf, right 5th finger: clear   Assessment & Plan  Perioral dermatitis perioral  Due to high risk of recurrence if stopped abruptly continue doxycycline 20mg  decreasing to once daily for 2 weeks then d/c if staying clear.   Doxycycline should be taken with food to prevent nausea. Do not lay down for 30 minutes after taking. Be cautious with sun exposure and use good sun protection while on this medication. Pregnant women should not take this medication.    Cheilitis lips  Start HC 2.5% oint BID x 1 week.   Continue Aquaphor and avoid lip balms containing beeswax.  Ordered Medications: hydrocortisone 2.5 % ointment  Other viral warts right ankle, right calf, right 5th finger  Return if symptoms worsen or fail to improve.  Graciella Belton, RMA, am acting as scribe for Forest Gleason, MD .  Documentation: I have reviewed the above documentation for accuracy and completeness, and I agree with the  above.  Forest Gleason, MD

## 2020-03-07 NOTE — Assessment & Plan Note (Signed)
Using cpap.  Increased fatigue.  Has gained weight.  Not sure if settings are accurate.  Refer back to pulmonary for reevaluation.

## 2020-03-07 NOTE — Assessment & Plan Note (Signed)
Low cholesterol diet and exercise.  Follow lipid panel.   

## 2020-03-07 NOTE — Assessment & Plan Note (Signed)
Follow cbc.  

## 2020-03-07 NOTE — Assessment & Plan Note (Signed)
Low carb diet and exercise.  Follow met b and a1c.  

## 2020-03-07 NOTE — Assessment & Plan Note (Signed)
Increased stress as outlined.  On zoloft.  Seeing a Social worker.  Interested in reevaluation with Dr Nicolasa Ducking.  May need medication adjustment.  Follow.  Contact Dr Nicolasa Ducking about appt.

## 2020-03-07 NOTE — Assessment & Plan Note (Signed)
No upper symptoms reported.  On nexium.   

## 2020-03-07 NOTE — Assessment & Plan Note (Signed)
Increased fatigue.  May be multifactorial.  Check labs, including cbc, met c and tsh.  Have pulmonary evaluate/reassess sleep apnea and cpap settings.

## 2020-03-09 ENCOUNTER — Encounter: Payer: Self-pay | Admitting: Dermatology

## 2020-03-13 ENCOUNTER — Other Ambulatory Visit: Payer: Self-pay | Admitting: Internal Medicine

## 2020-03-18 ENCOUNTER — Encounter: Payer: Self-pay | Admitting: Internal Medicine

## 2020-03-23 ENCOUNTER — Encounter: Payer: Self-pay | Admitting: Pulmonary Disease

## 2020-03-23 ENCOUNTER — Ambulatory Visit: Payer: BC Managed Care – PPO | Admitting: Pulmonary Disease

## 2020-03-23 ENCOUNTER — Other Ambulatory Visit: Payer: Self-pay

## 2020-03-23 VITALS — BP 130/74 | HR 81 | Temp 97.5°F | Ht 70.0 in | Wt 275.0 lb

## 2020-03-23 DIAGNOSIS — Z0000 Encounter for general adult medical examination without abnormal findings: Secondary | ICD-10-CM

## 2020-03-23 DIAGNOSIS — G4730 Sleep apnea, unspecified: Secondary | ICD-10-CM

## 2020-03-23 NOTE — Assessment & Plan Note (Signed)
Plan: Continue work with primary care, therapy as well as psychiatry with management of depression anxiety symptoms

## 2020-03-23 NOTE — Progress Notes (Signed)
@Patient  ID: Melissa Higgins, female    DOB: [birth year], 45 y.o.   MRN: [id number]  Chief Complaint  Patient presents with  . Follow-up    Wearing cpap avg 7-8hr nightly- mask is uncomfortable at times and leaks.     Referring provider: Einar Pheasant, MD  HPI:  45 year old female never smoker [facility] for mild obstructive sleep apnea  PMH: UTI, GERD Smoker/ Smoking History: Never smoker Maintenance: None Pt of: DR  03/23/2020  - Visit   45 year old female never smoker [facility] for mild obstructive sleep apnea.  Patient presenting to office today.  Former patient of DR.  Patient with history of mild obstructive sleep apnea and is using her CPAP most nights.  CPAP compliance report listed below:  02/22/2020-03/22/2020-CPAP compliance report-24 out of last 30 days used, 24 those days greater than 4 hours, average usage 7 hours and 15 minutes, APAP setting 8-20, AHI 0.9  Patient is wondering if she would benefit from using a nasal mask.  She feels that she may tolerate this better than the full face that she is using.  Patient is currently working with her primary care provider, therapist as well as psychiatrist to help with management of her depression anxiety symptoms.  She feels that the symptoms have worsened over the past couple months.  She believes this may also be affecting her sleep.   Questionaires / Pulmonary Flowsheets:   ACT:  No flowsheet data found.  MMRC: No flowsheet data found.  Epworth:  Results of the Epworth flowsheet 03/08/2018  Sitting and reading 1  Watching TV 0  Sitting, inactive in a public place (e.g. a theatre or a meeting) 0  As a passenger in a car for an hour without a break 2  Lying down to rest in the afternoon when circumstances permit 2  Sitting and talking to someone 0  Sitting quietly after a lunch without alcohol 0  In a car, while stopped for a few minutes in traffic 0  Total score 5    Tests:   Split-night  03/28/2018; AHI 12.8, required CPAP 8-20.  FENO:  No results found for: NITRICOXIDE  PFT: No flowsheet data found.  WALK:  No flowsheet data found.  Imaging: No results found.  Lab Results:  CBC    Component Value Date/Time   WBC 9.4 03/05/2020 0815   RBC 4.11 03/05/2020 0815   HGB 12.6 03/05/2020 0815   HCT 38.1 03/05/2020 0815   PLT 276.0 03/05/2020 0815   MCV 92.8 03/05/2020 0815   MCHC 33.1 03/05/2020 0815   RDW 13.0 03/05/2020 0815   LYMPHSABS 2.7 03/05/2020 0815   MONOABS 0.5 03/05/2020 0815   EOSABS 0.2 03/05/2020 0815   BASOSABS 0.1 03/05/2020 0815    BMET    Component Value Date/Time   NA 138 03/05/2020 0815   K 4.4 03/05/2020 0815   CL 104 03/05/2020 0815   CO2 26 03/05/2020 0815   GLUCOSE 88 03/05/2020 0815   BUN 18 03/05/2020 0815   CREATININE 0.73 03/05/2020 0815   CREATININE 0.72 11/25/2016 1608   CALCIUM 8.6 03/05/2020 0815    BNP No results found for: BNP  ProBNP No results found for: PROBNP  Specialty Problems      Pulmonary Problems   Sinusitis   Snoring   Sinusitis, acute   Sleep apnea      Allergies  Allergen Reactions  . Augmentin [Amoxicillin-Pot Clavulanate] Diarrhea  . Macrobid [Nitrofurantoin Macrocrystal]   . Nitrofurantoin Other (  See Comments) and Nausea And Vomiting    Other reaction(s): Unknown    Immunization History  Administered Date(s) Administered  . Influenza Split 09/10/2013  . Influenza,inj,Quad PF,6+ Mos 10/17/2017, 09/21/2018  . Influenza-Unspecified 10/23/2019  . PFIZER(Purple Top)SARS-COV-2 Vaccination 03/09/2019, 04/06/2019, 10/23/2019    Past Medical History:  Diagnosis Date  . C. difficile colitis    augment use  . Depression   . Dysmenorrhea   . Environmental allergies    allergy shots  . History of frequent urinary tract infections   . Hypercholesterolemia   . Sleep apnea    CPAP used at home    Tobacco History: Social History   Tobacco Use  Smoking Status Never Smoker   Smokeless Tobacco Never Used   Counseling given: Not Answered   Continue to not smoke  Outpatient Encounter Medications as of 03/23/2020  Medication Sig  . EPINEPHrine 0.3 mg/0.3 mL IJ SOAJ injection   . esomeprazole (NEXIUM) 40 MG capsule TAKE 1 CAPSULE(40 MG) BY MOUTH TWICE DAILY BEFORE MEALS  . Loratadine 10 MG CAPS Take by mouth.  . meloxicam (MOBIC) 15 MG tablet Take 1 tablet (15 mg total) by mouth daily.  . pseudoephedrine (SUDAFED) 120 MG 12 hr tablet Take 120 mg by mouth daily.  . sertraline (ZOLOFT) 100 MG tablet TAKE 1 TABLET(100 MG) BY MOUTH DAILY  . valACYclovir (VALTREX) 500 MG tablet TAKE 1 TABLET BY MOUTH DAILY  . [DISCONTINUED] hydrocortisone 2.5 % ointment Apply topically 2 (two) times daily. Apply to lips twice daily for up to 1 week.  . [DISCONTINUED] methylPREDNISolone (MEDROL DOSEPAK) 4 MG TBPK tablet 6 day dose pack - take as directed   Facility-Administered Encounter Medications as of 03/23/2020  Medication  . betamethasone acetate-betamethasone sodium phosphate (CELESTONE) injection 3 mg  . betamethasone acetate-betamethasone sodium phosphate (CELESTONE) injection 3 mg     Review of Systems  Review of Systems  Constitutional: Negative for activity change, fatigue and fever.  HENT: Negative for sinus pressure, sinus pain and sore throat.   Respiratory: Negative for cough, shortness of breath and wheezing.   Cardiovascular: Negative for chest pain and palpitations.  Gastrointestinal: Negative for diarrhea, nausea and vomiting.  Musculoskeletal: Negative for arthralgias.  Neurological: Negative for dizziness.  Psychiatric/Behavioral: Negative for sleep disturbance. The patient is not nervous/anxious.      Physical Exam  BP 130/74 (BP Location: Left Arm, Cuff Size: Normal)   Pulse 81   Temp (!) 97.5 F (36.4 C) (Temporal)   Ht 5\' 10"  (1.778 m)   Wt 275 lb (124.7 kg)   SpO2 100%   BMI 39.46 kg/m   Wt Readings from Last 5 Encounters:  03/23/20 275  lb (124.7 kg)  03/05/20 272 lb (123.4 kg)  07/31/19 251 lb (113.9 kg)  04/19/19 244 lb (110.7 kg)  01/18/19 245 lb (111.1 kg)    BMI Readings from Last 5 Encounters:  03/23/20 39.46 kg/m  03/05/20 39.03 kg/m  07/31/19 36.01 kg/m  04/19/19 35.01 kg/m  01/18/19 35.15 kg/m     Physical Exam Vitals and nursing note reviewed.  Constitutional:      General: She is not in acute distress.    Appearance: Normal appearance.  HENT:     Head: Normocephalic and atraumatic.     Right Ear: External ear normal.     Left Ear: External ear normal.     Nose: Nose normal. No congestion.     Mouth/Throat:     Mouth: Mucous membranes are moist.  Pharynx: Oropharynx is clear.  Eyes:     Pupils: Pupils are equal, round, and reactive to light.  Cardiovascular:     Rate and Rhythm: Normal rate and regular rhythm.     Pulses: Normal pulses.     Heart sounds: Normal heart sounds. No murmur heard.   Pulmonary:     Effort: Pulmonary effort is normal. No respiratory distress.     Breath sounds: No decreased air movement. No decreased breath sounds, wheezing or rales.  Abdominal:     Palpations: Abdomen is soft.  Musculoskeletal:     Cervical back: Normal range of motion.  Skin:    General: Skin is warm and dry.     Capillary Refill: Capillary refill takes less than 2 seconds.  Neurological:     General: No focal deficit present.     Mental Status: She is alert and oriented to person, place, and time. Mental status is at baseline.     Gait: Gait normal.  Psychiatric:        Mood and Affect: Mood normal.        Behavior: Behavior normal.        Thought Content: Thought content normal.        Judgment: Judgment normal.       Assessment & Plan:   Sleep apnea Plan: Continue CPAP therapy Order to DME company for mask of choice   Health care maintenance Plan: Continue work with primary care, therapy as well as psychiatry with management of depression anxiety  symptoms    Return in about 6 months (around 09/23/2020), or if symptoms worsen or fail to improve, for [facility].   Lauraine Rinne, NP 03/23/2020   This appointment required 32 minutes of patient care (this includes precharting, chart review, review of results, face-to-face care, etc.).

## 2020-03-23 NOTE — Assessment & Plan Note (Signed)
Plan: Continue CPAP therapy Order to DME company for mask of choice

## 2020-03-23 NOTE — Patient Instructions (Addendum)
You were seen today by Lauraine Rinne, NP  for:   1. Sleep apnea, unspecified type  Mask of choice DME company [employer]  We recommend that you continue using your CPAP daily >>>Keep up the hard work using your device >>> Goal should be wearing this for the entire night that you are sleeping, at least 4 to 6 hours  Remember:  . Do not drive or operate heavy machinery if tired or drowsy.  . Please notify the supply company and office if you are unable to use your device regularly due to missing supplies or machine being broken.  . Work on maintaining a healthy weight and following your recommended nutrition plan  . Maintain proper daily exercise and movement  . Maintaining proper use of your device can also help improve management of other chronic illnesses such as: Blood pressure, blood sugars, and weight management.   BiPAP/ CPAP Cleaning:  >>>Clean weekly, with Dawn soap, and bottle brush.  Set up to air dry. >>> Wipe mask out daily with wet wipe or towelette    Follow Up:    Return in about 6 months (around 09/23/2020), or if symptoms worsen or fail to improve, for [facility].   Notification of test results are managed in the following manner: If there are  any recommendations or changes to the  plan of care discussed in office today,  we will contact you and let you know what they are. If you do not hear from [place], then your results are normal and you can view them through your  MyChart account , or a letter will be sent to you. Thank you again for trusting [place] with your care  - Thank you, [facility]    It is flu season:   >>> Best ways to protect herself from the flu: Receive the yearly flu vaccine, practice good hand hygiene washing with soap and also using hand sanitizer when available, eat a nutritious meals, get adequate rest, hydrate appropriately       Please contact the office if your symptoms worsen or you have concerns that you are not  improving.   Thank you for choosing [facility] for your healthcare, and for allowing [place] to partner with you on your healthcare journey. I am thankful to be able to provide care to you today.   Wyn Quaker FNP-C    Sleep Apnea Sleep apnea affects breathing during sleep. It causes breathing to stop for a short time or to become shallow. It can also increase the risk of:  Heart attack.  Stroke.  Being very overweight (obese).  Diabetes.  Heart failure.  Irregular heartbeat. The goal of treatment is to help you breathe normally again. What are the causes? There are three kinds of sleep apnea:  Obstructive sleep apnea. This is caused by a blocked or collapsed airway.  Central sleep apnea. This happens when the brain does not send the right signals to the muscles that control breathing.  Mixed sleep apnea. This is a combination of obstructive and central sleep apnea. The most common cause of this condition is a collapsed or blocked airway. This can happen if:  Your throat muscles are too relaxed.  Your tongue and tonsils are too large.  You are overweight.  Your airway is too small.   What increases the risk?  Being overweight.  Smoking.  Having a small airway.  Being older.  Being female.  Drinking alcohol.  Taking medicines to calm  yourself (sedatives or tranquilizers).  Having family members with the condition. What are the signs or symptoms?  Trouble staying asleep.  Being sleepy or tired during the day.  Getting angry a lot.  Loud snoring.  Headaches in the morning.  Not being able to focus your mind (concentrate).  Forgetting things.  Less interest in sex.  Mood swings.  Personality changes.  Feelings of sadness (depression).  Waking up a lot during the night to pee (urinate).  Dry mouth.  Sore throat. How is this diagnosed?  Your medical history.  A physical exam.  A test that is done when you are sleeping (sleep  study). The test is most often done in a sleep lab but may also be done at home. How is this treated?  Sleeping on your side.  Using a medicine to get rid of mucus in your nose (decongestant).  Avoiding the use of alcohol, medicines to help you relax, or certain pain medicines (narcotics).  Losing weight, if needed.  Changing your diet.  Not smoking.  Using a machine to open your airway while you sleep, such as: ? An oral appliance. This is a mouthpiece that shifts your lower jaw forward. ? A CPAP device. This device blows air through a mask when you breathe out (exhale). ? An EPAP device. This has valves that you put in each nostril. ? A BPAP device. This device blows air through a mask when you breathe in (inhale) and breathe out.  Having surgery if other treatments do not work. It is important to get treatment for sleep apnea. Without treatment, it can lead to:  High blood pressure.  Coronary artery disease.  In men, not being able to have an erection (impotence).  Reduced thinking ability.   Follow these instructions at home: Lifestyle  Make changes that your doctor recommends.  Eat a healthy diet.  Lose weight if needed.  Avoid alcohol, medicines to help you relax, and some pain medicines.  Do not use any products that contain nicotine or tobacco, such as cigarettes, e-cigarettes, and chewing tobacco. If you need help quitting, ask your doctor. General instructions  Take over-the-counter and prescription medicines only as told by your doctor.  If you were given a machine to use while you sleep, use it only as told by your doctor.  If you are having surgery, make sure to tell your doctor you have sleep apnea. You may need to bring your device with you.  Keep all follow-up visits as told by your doctor. This is important. Contact a doctor if:  The machine that you were given to use during sleep bothers you or does not seem to be working.  You do not get  better.  You get worse. Get help right away if:  Your chest hurts.  You have trouble breathing in enough air.  You have an uncomfortable feeling in your back, arms, or stomach.  You have trouble talking.  One side of your body feels weak.  A part of your face is hanging down. These symptoms may be an emergency. Do not wait to see if the symptoms will go away. Get medical help right away. Call your local emergency services (911 in the [place] Do not drive yourself to the hospital. Summary  This condition affects breathing during sleep.  The most common cause is a collapsed or blocked airway.  The goal of treatment is to help you breathe normally while you sleep. This information is not intended to replace  advice given to you by your health care provider. Make sure you discuss any questions you have with your health care provider. Document Revised: 10/13/2017 Document Reviewed: 08/22/2017 Elsevier Patient Education  [place].

## 2020-03-23 NOTE — Progress Notes (Signed)
Reviewed and agree with assessment/plan.   Chesley Mires, MD [facility] Pulmonary/Critical Care 03/23/2020, 5:28 PM Pager:  [phone]

## 2020-03-24 ENCOUNTER — Ambulatory Visit: Payer: BC Managed Care – PPO | Admitting: Podiatry

## 2020-03-24 DIAGNOSIS — M722 Plantar fascial fibromatosis: Secondary | ICD-10-CM | POA: Diagnosis not present

## 2020-03-24 MED ORDER — BETAMETHASONE SOD PHOS & ACET 6 (3-3) MG/ML IJ SUSP: 3.0000 mg | Freq: Once | INTRAMUSCULAR | Status: DC

## 2020-03-24 NOTE — Progress Notes (Signed)
   Subjective: 45 y.o. female presenting today for follow-up evaluation of plantar fasciitis to the right foot.  Patient states there is some improvement.  She has been taking the meloxicam daily.  She also states that the injection and prednisone pack helped.  She presents for follow-up treatment and evaluation  Past Medical History:  Diagnosis Date  . C. difficile colitis    augment use  . Depression   . Dysmenorrhea   . Environmental allergies    allergy shots  . History of frequent urinary tract infections   . Hypercholesterolemia   . Sleep apnea    CPAP used at home     Objective: Physical Exam General: The patient is alert and oriented x3 in no acute distress.  Dermatology: Skin is warm, dry and supple bilateral lower extremities. Negative for open lesions or macerations bilateral.   Vascular: Dorsalis Pedis and Posterior Tibial pulses palpable bilateral.  Capillary fill time is immediate to all digits.  Neurological: Epicritic and protective threshold intact bilateral.   Musculoskeletal: Tenderness to palpation to the plantar aspect of the right heel along the plantar fascia greater than the left. All other joints range of motion within normal limits bilateral. Strength 5/5 in all groups bilateral.   Radiographic exam: Normal osseous mineralization. Joint spaces preserved. No fracture/dislocation/boney destruction. No other soft tissue abnormalities or radiopaque foreign bodies.   Assessment: 1. Plantar fasciitis bilateral RT > LT  Plan of Care:  1. Patient evaluated.    2. Injection of 0.5cc Celestone soluspan injected into the right plantar fascia  3.  Continue meloxicam 15 mg daily. 4.  Continue Dansko shoes or OTC insoles from [employer] running store 5.  Return to clinic in 4 weeks  Edrick Kins, DPM [facility]  Dr. Edrick Kins, DPM    [address],  [address]                Office  [phone]  Fax [phone]

## 2020-04-09 ENCOUNTER — Encounter: Payer: Self-pay | Admitting: Podiatry

## 2020-04-14 ENCOUNTER — Telehealth: Payer: Self-pay | Admitting: Pulmonary Disease

## 2020-04-14 NOTE — Telephone Encounter (Signed)
I have sent CM to [employer] asking them to check into this matter. Waiting their response

## 2020-04-14 NOTE — Telephone Encounter (Signed)
Pt returned your call. Her # is [phone]

## 2020-04-14 NOTE — Telephone Encounter (Signed)
Lm for patient.  

## 2020-04-14 NOTE — Telephone Encounter (Signed)
Spoke to patient, who stated that she that she has not been contacted by [employer] for mask fitting. She would like to try a nasal mask. Order was placed on 03/23/2020.  Patient is unsure if [employer] is having trouble locating her account because of her recent name change. Patient may be listed under Melissa Higgins.  Rodena Piety, can you help with this? Thanks

## 2020-04-16 ENCOUNTER — Other Ambulatory Visit: Payer: Self-pay | Admitting: Internal Medicine

## 2020-04-16 DIAGNOSIS — Z1231 Encounter for screening mammogram for malignant neoplasm of breast: Secondary | ICD-10-CM

## 2020-04-22 ENCOUNTER — Other Ambulatory Visit: Payer: Self-pay | Admitting: Internal Medicine

## 2020-04-22 ENCOUNTER — Other Ambulatory Visit: Payer: Self-pay | Admitting: Podiatry

## 2020-04-22 NOTE — Telephone Encounter (Signed)
Please advise 

## 2020-04-27 ENCOUNTER — Encounter: Payer: Self-pay | Admitting: Internal Medicine

## 2020-04-28 NOTE — Telephone Encounter (Signed)
Rodena Piety, please advise on update or if message can be closed. Thanks.

## 2020-04-28 NOTE — Telephone Encounter (Signed)
Called and spoke to Melissa Higgins. Patient was scheduled for a Nurse TB skin test on 03/07/20. Chelise declined scheduling for date to be read and states that she will schedule her follow up reading when she comes in for her appointment on 03/07/2020.

## 2020-04-28 NOTE — Telephone Encounter (Signed)
Ok to schedule TB skin test.

## 2020-04-30 NOTE — Telephone Encounter (Signed)
I finally got a response from [place] with [employer] per the notes It looks like order was canceled by the patient on 03-30-20. She was not eligible for a shipment at that time. She is now eligible per the note date. I will have someone reach out to her to see if she wants these items shipped now.   Thank you,   Demetrius Charity

## 2020-05-05 ENCOUNTER — Ambulatory Visit (INDEPENDENT_AMBULATORY_CARE_PROVIDER_SITE_OTHER): Payer: BC Managed Care – PPO

## 2020-05-05 ENCOUNTER — Ambulatory Visit: Payer: BC Managed Care – PPO | Admitting: Podiatry

## 2020-05-05 ENCOUNTER — Other Ambulatory Visit: Payer: Self-pay

## 2020-05-05 DIAGNOSIS — Z111 Encounter for screening for respiratory tuberculosis: Secondary | ICD-10-CM

## 2020-05-05 NOTE — Progress Notes (Signed)
Patient presented for PPD injection to right forearm, patient voiced no concerns nor showed any signs of distress during injection.

## 2020-05-08 ENCOUNTER — Other Ambulatory Visit: Payer: Self-pay

## 2020-05-08 ENCOUNTER — Ambulatory Visit (INDEPENDENT_AMBULATORY_CARE_PROVIDER_SITE_OTHER): Payer: BC Managed Care – PPO

## 2020-05-08 DIAGNOSIS — Z111 Encounter for screening for respiratory tuberculosis: Secondary | ICD-10-CM

## 2020-05-08 LAB — TB SKIN TEST
Induration: 0 mm
TB Skin Test: NEGATIVE

## 2020-05-08 NOTE — Progress Notes (Signed)
Patient presented for PPD read to the right Forearm.  Results: Negative/0MM

## 2020-05-08 NOTE — Telephone Encounter (Signed)
These can be drawn at appt.  Can order and make note on schedule.

## 2020-05-14 ENCOUNTER — Ambulatory Visit: Payer: BC Managed Care – PPO | Admitting: Internal Medicine

## 2020-05-14 ENCOUNTER — Other Ambulatory Visit: Payer: Self-pay

## 2020-05-14 VITALS — BP 104/64 | HR 73 | Temp 96.8°F | Resp 16 | Ht 70.0 in | Wt 260.0 lb

## 2020-05-14 DIAGNOSIS — E7800 Pure hypercholesterolemia, unspecified: Secondary | ICD-10-CM

## 2020-05-14 DIAGNOSIS — G4730 Sleep apnea, unspecified: Secondary | ICD-10-CM

## 2020-05-14 DIAGNOSIS — Z0000 Encounter for general adult medical examination without abnormal findings: Secondary | ICD-10-CM

## 2020-05-14 DIAGNOSIS — R739 Hyperglycemia, unspecified: Secondary | ICD-10-CM

## 2020-05-14 DIAGNOSIS — D649 Anemia, unspecified: Secondary | ICD-10-CM

## 2020-05-14 DIAGNOSIS — K219 Gastro-esophageal reflux disease without esophagitis: Secondary | ICD-10-CM | POA: Diagnosis not present

## 2020-05-14 DIAGNOSIS — J329 Chronic sinusitis, unspecified: Secondary | ICD-10-CM

## 2020-05-14 DIAGNOSIS — F439 Reaction to severe stress, unspecified: Secondary | ICD-10-CM | POA: Diagnosis not present

## 2020-05-14 MED ORDER — CEFDINIR 300 MG PO CAPS: 300.0000 mg | ORAL_CAPSULE | Freq: Two times a day (BID) | ORAL | 0 refills | Status: DC

## 2020-05-14 MED ORDER — FLUCONAZOLE 150 MG PO TABS: ORAL_TABLET | ORAL | 0 refills | Status: DC

## 2020-05-14 NOTE — Progress Notes (Signed)
Patient ID: Melissa Higgins, female   DOB: Apr 22, 1975, 45 y.o.   MRN: [id number]   Subjective:    Patient ID: Melissa Higgins, female    DOB: Aug 06, 1975, 45 y.o.   MRN: [id number]  HPI This visit occurred during the SARS-CoV-2 public health emergency.  Safety protocols were in place, including screening questions prior to the visit, additional usage of staff PPE, and extensive cleaning of exam room while observing appropriate contact time as indicated for disinfecting solutions.  Patient here for a scheduled follow up. Here to follow up regarding increased stress.  Also with concerns regarding persistent sinus congestion and pressure.  Seeing Dr Nicolasa Ducking.  Doing well on zoloft, wellbutrin and trazodone.  Feels better.  Still with increased stress. Discussed.  Has started Noom.  Lost weight.  Discussed diet and exercise.  Does report increased sinus pressure - productive of colored mucus.  Right ear itching.  Dry cough.  Throat irritated.  Using nasacort and taking sudafed.  Symptoms persistent. Diagnosed with sleep apnea - nasal mask.  No chest pain or sob reported.  No nausea or vomiting reported.  No abdominal pain.     Past Medical History:  Diagnosis Date  . C. difficile colitis    augment use  . Depression   . Dysmenorrhea   . Environmental allergies    allergy shots  . History of frequent urinary tract infections   . Hypercholesterolemia   . Sleep apnea    CPAP used at home   Past Surgical History:  Procedure Laterality Date  . COLONOSCOPY WITH PROPOFOL N/A 01/18/2019   Procedure: COLONOSCOPY WITH PROPOFOL;  Surgeon: Jonathon Bellows, MD;  Location: [facility] ENDOSCOPY;  Service: Gastroenterology;  Laterality: N/A;  . NASAL SINUS SURGERY  2003  . UTERINE FIBROID SURGERY  2019   Family History  Problem Relation Age of Onset  . Hyperlipidemia Mother   . Asthma Father   . Hyperlipidemia Father   . Allergies Father   . Breast cancer Neg Hx    Social History   Socioeconomic History  .  Marital status: Divorced    Spouse name: Not on file  . Number of children: 0  . Years of education: college  . Highest education level: Not on file  Occupational History  . Not on file  Tobacco Use  . Smoking status: Never Smoker  . Smokeless tobacco: Never Used  Substance and Sexual Activity  . Alcohol use: Yes    Alcohol/week: 0.0 standard drinks    Comment: occasional  . Drug use: No  . Sexual activity: Yes    Birth control/protection: None  Other Topics Concern  . 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   Social Determinants of Health   Financial Resource Strain: Not on file  Food Insecurity: Not on file  Transportation Needs: Not on file  Physical Activity: Not on file  Stress: Not on file  Social Connections: Not on file    Outpatient Encounter Medications as of 05/14/2020  Medication Sig  . cefdinir (OMNICEF) 300 MG capsule Take 1 capsule (300 mg total) by mouth 2 (two) times daily.  . fluconazole (DIFLUCAN) 150 MG tablet Take one tablet x 1.  May repeat x 1 in three days if persistent symptoms  . buPROPion (WELLBUTRIN SR) 100 MG 12 hr tablet Take 100 mg by mouth 2 (two) times daily.  Marland Kitchen EPINEPHrine 0.3 mg/0.3 mL IJ SOAJ injection   .  esomeprazole (NEXIUM) 40 MG capsule TAKE 1 CAPSULE(40 MG) BY MOUTH TWICE DAILY BEFORE MEALS  . Loratadine 10 MG CAPS Take by mouth.  . meloxicam (MOBIC) 15 MG tablet Take 1 tablet (15 mg total) by mouth daily.  . pseudoephedrine (SUDAFED) 120 MG 12 hr tablet Take 120 mg by mouth daily.  . sertraline (ZOLOFT) 100 MG tablet TAKE 1 TABLET(100 MG) BY MOUTH DAILY  . traZODone (DESYREL) 50 MG tablet Take 50-100 mg by mouth at bedtime as needed.  . valACYclovir (VALTREX) 500 MG tablet TAKE 1 TABLET BY MOUTH DAILY   Facility-Administered Encounter Medications as of 05/14/2020  Medication  . betamethasone acetate-betamethasone sodium phosphate (CELESTONE) injection 3 mg  .  betamethasone acetate-betamethasone sodium phosphate (CELESTONE) injection 3 mg  . betamethasone acetate-betamethasone sodium phosphate (CELESTONE) injection 3 mg    Review of Systems  Constitutional: Negative for appetite change and unexpected weight change.  HENT: Positive for congestion and sinus pressure.   Respiratory: Positive for cough. Negative for chest tightness and shortness of breath.   Cardiovascular: Negative for chest pain, palpitations and leg swelling.  Gastrointestinal: Negative for abdominal pain, diarrhea, nausea and vomiting.  Genitourinary: Negative for difficulty urinating and dysuria.  Musculoskeletal: Negative for joint swelling and myalgias.  Skin: Negative for color change and rash.  Neurological: Negative for dizziness, light-headedness and headaches.  Psychiatric/Behavioral: Negative for agitation and dysphoric mood.       Objective:    Physical Exam Vitals reviewed.  Constitutional:      General: She is not in acute distress.    Appearance: Normal appearance.  HENT:     Head: Normocephalic and atraumatic.     Right Ear: External ear normal.     Left Ear: External ear normal.     Ears:     Comments: No irritation in ear canal Eyes:     General: No scleral icterus.       Right eye: No discharge.        Left eye: No discharge.     Conjunctiva/sclera: Conjunctivae normal.  Neck:     Thyroid: No thyromegaly.  Cardiovascular:     Rate and Rhythm: Normal rate and regular rhythm.  Pulmonary:     Effort: No respiratory distress.     Breath sounds: Normal breath sounds. No wheezing.  Abdominal:     General: Bowel sounds are normal.     Palpations: Abdomen is soft.     Tenderness: There is no abdominal tenderness.  Musculoskeletal:        General: No swelling or tenderness.     Cervical back: Neck supple. No tenderness.  Lymphadenopathy:     Cervical: No cervical adenopathy.  Skin:    Findings: No erythema or rash.  Neurological:     Mental  Status: She is alert.  Psychiatric:        Mood and Affect: Mood normal.        Behavior: Behavior normal.     BP 104/64   Pulse 73   Temp (!) 96.8 F (36 C) (Temporal)   Resp 16   Ht 5' 10"  (1.778 m)   Wt 260 lb (117.9 kg)   SpO2 98%   BMI 37.31 kg/m  Wt Readings from Last 3 Encounters:  05/14/20 260 lb (117.9 kg)  03/23/20 275 lb (124.7 kg)  03/05/20 272 lb (123.4 kg)     Lab Results  Component Value Date   WBC 9.4 03/05/2020   HGB 12.6 03/05/2020   HCT 38.1  03/05/2020   PLT 276.0 03/05/2020   GLUCOSE 88 03/05/2020   CHOL 215 (H) 03/05/2020   TRIG 161.0 (H) 03/05/2020   HDL 43.00 03/05/2020   LDLCALC 140 (H) 03/05/2020   ALT 13 03/05/2020   AST 15 03/05/2020   NA 138 03/05/2020   K 4.4 03/05/2020   CL 104 03/05/2020   CREATININE 0.73 03/05/2020   BUN 18 03/05/2020   CO2 26 03/05/2020   TSH 2.14 03/05/2020   HGBA1C 5.7 03/05/2020    MM 3D SCREEN BREAST BILATERAL  Result Date: 05/29/2019 CLINICAL DATA:  Screening. EXAM: DIGITAL SCREENING BILATERAL MAMMOGRAM WITH TOMO AND CAD COMPARISON:  Previous exam(s). ACR Breast Density Category c: The breast tissue is heterogeneously dense, which may obscure small masses. FINDINGS: There are no findings suspicious for malignancy. Images were processed with CAD. IMPRESSION: No mammographic evidence of malignancy. A result letter of this screening mammogram will be mailed directly to the patient. RECOMMENDATION: Screening mammogram in one year. (Code:SM-B-01Y) BI-RADS CATEGORY  1: Negative. Electronically Signed   By: Ammie Ferrier M.D.   On: 05/29/2019 16:54       Assessment & Plan:   Problem List Items Addressed This Visit    Anemia    Follow cbc.       GERD (gastroesophageal reflux disease)    Continue nexium.  No upper symptoms reported.        Hypercholesterolemia    Low cholesterol diet and exercise.  Follow lipid panel.       Hyperglycemia    Low carb diet and exercise.  Follow met b and a1c.        Sinusitis    Persistent increased sinus pressure and congestion.  Continue nasacort.  Saline nasal spray.  Mucinex.  Treat with omnicef as directed.  Follow.  Call with update.  Probiotics as directed.  rx for diflucan given to take if needed.       Relevant Medications   cefdinir (OMNICEF) 300 MG capsule   fluconazole (DIFLUCAN) 150 MG tablet   Sleep apnea    cpap - nasal device.  Follow.       Stress    Increased stress.  Discussed.  Seeing Dr Nicolasa Ducking.  Continue wellbutrin, zoloft and trazodone.  Follow. Appears to be doing better.        Other Visit Diagnoses    Routine general medical examination at a health care facility    -  Primary   Relevant Orders   Varicella zoster antibody, IgG (Completed)   Measles/Mumps/Rubella Immunity (Completed)       Einar Pheasant, MD

## 2020-05-15 LAB — MEASLES/MUMPS/RUBELLA IMMUNITY
Mumps IgG: 65.1 AU/mL
Rubella: <0.9 Index — ABNORMAL LOW
Rubeola IgG: 27 AU/mL

## 2020-05-15 LAB — VARICELLA ZOSTER ANTIBODY, IGG: Varicella IgG: 776.5 index

## 2020-05-19 ENCOUNTER — Encounter: Payer: Self-pay | Admitting: Internal Medicine

## 2020-05-20 NOTE — Telephone Encounter (Signed)
Called to schedule patient for COVID test. She stated that she started abx on Thursday. She did not start mucinex until Sunday. Pt stated that she is going to continue doing this and start using her nasal sprays regularly and call with update. Home COVID tests were negative. Stated that she does not really feel worse. She is going to give it a couple more days and let me know if she needs anything.

## 2020-05-20 NOTE — Telephone Encounter (Signed)
Can schedule her for a virtual visit. Also, sounds like with sore throat and cough - she needs to be covid tested.  See if can test today.

## 2020-05-20 NOTE — Telephone Encounter (Signed)
We just saw her last week. Do you want to do a virtual with her to re-evaluate?

## 2020-05-24 ENCOUNTER — Encounter: Payer: Self-pay | Admitting: Internal Medicine

## 2020-05-24 NOTE — Assessment & Plan Note (Addendum)
Increased stress.  Discussed.  Seeing Dr Nicolasa Ducking.  Continue wellbutrin, zoloft and trazodone.  Follow. Appears to be doing better.

## 2020-05-24 NOTE — Assessment & Plan Note (Signed)
Low carb diet and exercise.  Follow met b and a1c.  

## 2020-05-24 NOTE — Assessment & Plan Note (Signed)
Continue nexium.  No upper symptoms reported.   

## 2020-05-24 NOTE — Assessment & Plan Note (Signed)
Follow cbc.  

## 2020-05-24 NOTE — Assessment & Plan Note (Signed)
Low cholesterol diet and exercise.  Follow lipid panel.   

## 2020-05-24 NOTE — Assessment & Plan Note (Signed)
cpap - nasal device.  Follow.

## 2020-05-24 NOTE — Assessment & Plan Note (Signed)
Persistent increased sinus pressure and congestion.  Continue nasacort.  Saline nasal spray.  Mucinex.  Treat with omnicef as directed.  Follow.  Call with update.  Probiotics as directed.  rx for diflucan given to take if needed.

## 2020-05-29 NOTE — Telephone Encounter (Signed)
Patient is almost done with 2 rounds of antibiotics and still having persistent head congestion and cough. Melissa Higgins is going to call ENT to get an appt with Dr Tami Ribas for her persistent sinus issues. Advised per Dr Nicki Reaper that Melissa Higgins could continue to use the mucinex and take delsym for the cough. Holding on MMR vaccine for now. Patient will let me know if Melissa Higgins needs anything

## 2020-06-10 NOTE — Telephone Encounter (Signed)
I would recommend spacing out the MMR and Tdap - waiting at least a few weeks between the doses.

## 2020-06-10 NOTE — Telephone Encounter (Signed)
Patient has been scheduled for MMR vaccine. Wants to know if you are ok with her getting a Tdap while she is here instead of going to pharmacy. She is aware of the cost difference.

## 2020-06-11 ENCOUNTER — Ambulatory Visit
Admission: RE | Admit: 2020-06-11 | Discharge: 2020-06-11 | Disposition: A | Discharge status: Home / Self Care | Payer: BC Managed Care – PPO | Source: Ambulatory Visit | Attending: Internal Medicine | Admitting: Internal Medicine

## 2020-06-11 ENCOUNTER — Other Ambulatory Visit: Payer: Self-pay

## 2020-06-11 DIAGNOSIS — Z1231 Encounter for screening mammogram for malignant neoplasm of breast: Secondary | ICD-10-CM | POA: Insufficient documentation

## 2020-06-17 ENCOUNTER — Ambulatory Visit: Payer: BC Managed Care – PPO

## 2020-06-22 ENCOUNTER — Ambulatory Visit: Payer: BC Managed Care – PPO | Admitting: Podiatry

## 2020-06-25 ENCOUNTER — Ambulatory Visit: Payer: BC Managed Care – PPO

## 2020-06-25 ENCOUNTER — Other Ambulatory Visit: Payer: Self-pay

## 2020-06-26 ENCOUNTER — Ambulatory Visit (INDEPENDENT_AMBULATORY_CARE_PROVIDER_SITE_OTHER): Payer: BC Managed Care – PPO

## 2020-06-26 DIAGNOSIS — Z23 Encounter for immunization: Secondary | ICD-10-CM

## 2020-06-26 NOTE — Progress Notes (Signed)
Patient presented for MMR injection to left arm, patient voiced no concerns nor showed any signs of distress during injection.

## 2020-07-08 ENCOUNTER — Encounter: Payer: Self-pay | Admitting: Internal Medicine

## 2020-07-09 NOTE — Telephone Encounter (Signed)
Please call and notify her that I do not mind seeing her, but  if she is having increased pain, I can get her an appt with ortho or she can go to [facility] for evaluation.  May save her two appts.  I can also see her as well - whatever she prefers.

## 2020-07-10 NOTE — Telephone Encounter (Signed)
Patient is going to [facility]. She is going to wait to get her MMR vaccine until she gets back from her vacation.

## 2020-07-15 NOTE — Telephone Encounter (Signed)
Called patient to clarify. She did have the MMR vaccines when she was younger and then had another one on 06/26/20 after having titer drawn. Does she need another MMR vaccine?

## 2020-07-20 ENCOUNTER — Ambulatory Visit: Payer: BC Managed Care – PPO | Admitting: Internal Medicine

## 2020-07-20 NOTE — Telephone Encounter (Signed)
She should not need another MMR vaccine.  (Has had two previous and one recent).  ID checking as well.

## 2020-08-09 ENCOUNTER — Other Ambulatory Visit: Payer: Self-pay | Admitting: Internal Medicine

## 2020-08-12 ENCOUNTER — Other Ambulatory Visit: Payer: Self-pay

## 2020-08-12 ENCOUNTER — Ambulatory Visit: Payer: BC Managed Care – PPO | Admitting: Dermatology

## 2020-08-12 DIAGNOSIS — L720 Epidermal cyst: Secondary | ICD-10-CM

## 2020-08-12 NOTE — Patient Instructions (Signed)

## 2020-08-12 NOTE — Progress Notes (Signed)
   Follow-Up Visit   Subjective  Melissa Higgins is a 45 y.o. female who presents for the following: lesion (Check lesion on chest. Noticed 2 1/2 weeks ago, after returning from beach trip. Tender to touch. No other symptoms).  The following portions of the chart were reviewed this encounter and updated as appropriate:  Tobacco  Allergies  Meds  Problems  Med Hx  Surg Hx  Fam Hx      Review of Systems: No other skin or systemic complaints except as noted in HPI or Assessment and Plan.   Objective  Well appearing patient in no apparent distress; mood and affect are within normal limits.  A focused examination was performed including face, chest. Relevant physical exam findings are noted in the Assessment and Plan.  left mid chest 1.0 cm slightly erythematous subcutaneous compressible nodule.  Assessment & Plan  Epidermal inclusion cyst left mid chest  Benign-appearing. Exam most consistent with an epidermal inclusion cyst. Discussed that a cyst is a benign growth that can grow over time and sometimes get irritated or inflamed. Recommend observation if it is not bothersome. Discussed option of surgical excision to remove it if it is growing, symptomatic, or other changes noted. Please call for new or changing lesions so they can be evaluated.  Patient deferred treatment at this time.    Return if symptoms worsen or fail to improve.  I, Emelia Salisbury, CMA, am acting as scribe for Forest Gleason, MD.  Documentation: I have reviewed the above documentation for accuracy and completeness, and I agree with the above.  Forest Gleason, MD

## 2020-08-23 ENCOUNTER — Encounter: Payer: Self-pay | Admitting: Dermatology

## 2020-08-25 ENCOUNTER — Other Ambulatory Visit: Payer: Self-pay | Admitting: Podiatry

## 2020-08-25 NOTE — Telephone Encounter (Signed)
Please advise 

## 2020-09-02 ENCOUNTER — Telehealth: Payer: Self-pay | Admitting: Podiatry

## 2020-09-02 NOTE — Telephone Encounter (Signed)
[facility] sent a prescription refill on meloxicoam '15mg'$ . Dr.Evans he ok the refills as needed

## 2020-09-03 ENCOUNTER — Ambulatory Visit: Payer: BC Managed Care – PPO | Admitting: Dermatology

## 2020-09-08 ENCOUNTER — Encounter: Payer: Self-pay | Admitting: Internal Medicine

## 2020-09-09 NOTE — Telephone Encounter (Signed)
Spoke with patient, she has been having these issues for a while. She is going to go to [facility] for evaluation and then f/u with [place] if needed.

## 2020-10-20 DIAGNOSIS — M545 Low back pain, unspecified: Secondary | ICD-10-CM | POA: Insufficient documentation

## 2020-11-24 ENCOUNTER — Other Ambulatory Visit: Payer: Self-pay | Admitting: Internal Medicine

## 2020-12-29 ENCOUNTER — Ambulatory Visit: Payer: BC Managed Care – PPO | Admitting: Podiatry

## 2020-12-29 ENCOUNTER — Other Ambulatory Visit: Payer: Self-pay

## 2020-12-29 DIAGNOSIS — L600 Ingrowing nail: Secondary | ICD-10-CM

## 2020-12-29 MED ORDER — MELOXICAM 15 MG PO TABS: 15.0000 mg | ORAL_TABLET | Freq: Every day | ORAL | 1 refills | Status: DC

## 2020-12-29 NOTE — Progress Notes (Signed)
° °  HPI: 45 y.o. female presenting today for concern of a possible ingrown toenail to the left great toe medial border.  Patient states that she experiences some tenderness with palpation along the inside of the left great toenail plate.  She did have a pedicure a few weeks ago.  She presents for further treatment and evaluation  Past Medical History:  Diagnosis Date   C. difficile colitis    augment use   Depression    Dysmenorrhea    Environmental allergies    allergy shots   History of frequent urinary tract infections    Hypercholesterolemia    Sleep apnea    CPAP used at home    Past Surgical History:  Procedure Laterality Date   COLONOSCOPY WITH PROPOFOL N/A 01/18/2019   Procedure: COLONOSCOPY WITH PROPOFOL;  Surgeon: Jonathon Bellows, MD;  Location: [facility] ENDOSCOPY;  Service: Gastroenterology;  Laterality: N/A;   NASAL SINUS SURGERY  2003   UTERINE FIBROID SURGERY  2019    Allergies  Allergen Reactions   Augmentin [Amoxicillin-Pot Clavulanate] Diarrhea   Macrobid [Nitrofurantoin Macrocrystal]    Nitrofurantoin Other (See Comments) and Nausea And Vomiting    Other reaction(s): Unknown     Physical Exam: General: The patient is alert and oriented x3 in no acute distress.  Dermatology: There is a significant amount of subungual debris and callus tissue along the medial nail fold of the left hallux nail plate.  There is associated tenderness to palpation in this area as well  Vascular: Palpable pedal pulses bilaterally. Capillary refill within normal limits.  Negative for any significant edema or erythema  Neurological: Light touch and protective threshold grossly intact  Musculoskeletal Exam: No pedal deformities noted   Assessment: 1.  Subungual debris with callus tissue medial nail fold left hallux 2.  History of intermittent plantar fasciitis bilateral   Plan of Care:  1. Patient evaluated.  2.  Light debridement of the area using a tissue nipper to remove the callus  tissue and the subungual debris was performed today.  The patient felt immediate relief. 3.  Recommend wide fitting shoes that do not constrict the toebox area and irritate the toe 4.  Explained to the patient that for now we will keep it simple and conservative to see if this helps to alleviate her symptoms.  Return to the clinic if she continues to have pain and tenderness associated to this area and we will perform a partial nail matricectomy of the medial border of the left hallux 5.  Prescription for meloxicam 15 mg daily as needed.  She has had this in the past with good success of any anti-inflammatory or mild pain      Edrick Kins, DPM [facility]  Dr. Edrick Kins, DPM    [address], [place] [address]                Office [phone]  Fax [phone]

## 2020-12-30 ENCOUNTER — Other Ambulatory Visit: Payer: Self-pay | Admitting: Internal Medicine

## 2021-02-21 ENCOUNTER — Other Ambulatory Visit: Payer: Self-pay | Admitting: Internal Medicine

## 2021-02-21 ENCOUNTER — Other Ambulatory Visit: Payer: Self-pay | Admitting: Podiatry

## 2021-03-01 ENCOUNTER — Telehealth: Payer: Self-pay | Admitting: Internal Medicine

## 2021-03-01 ENCOUNTER — Encounter: Payer: Self-pay | Admitting: Internal Medicine

## 2021-03-01 NOTE — Telephone Encounter (Addendum)
Left message asking patient to schedule Physical with her PCP. The provider is asking that the patient schedule a follow up visit with her in May 2023.

## 2021-05-14 ENCOUNTER — Encounter: Payer: Self-pay | Admitting: Internal Medicine

## 2021-05-14 ENCOUNTER — Ambulatory Visit: Payer: BC Managed Care – PPO | Admitting: Internal Medicine

## 2021-05-14 ENCOUNTER — Other Ambulatory Visit (HOSPITAL_COMMUNITY)
Admission: RE | Admit: 2021-05-14 | Discharge: 2021-05-14 | Disposition: A | Discharge status: Home / Self Care | Payer: BC Managed Care – PPO | Source: Ambulatory Visit | Attending: Internal Medicine | Admitting: Internal Medicine

## 2021-05-14 VITALS — BP 126/88 | HR 79 | Temp 98.0°F | Resp 13 | Ht 70.0 in | Wt 232.0 lb

## 2021-05-14 DIAGNOSIS — N898 Other specified noninflammatory disorders of vagina: Secondary | ICD-10-CM

## 2021-05-14 DIAGNOSIS — Z0000 Encounter for general adult medical examination without abnormal findings: Secondary | ICD-10-CM

## 2021-05-14 DIAGNOSIS — Z124 Encounter for screening for malignant neoplasm of cervix: Secondary | ICD-10-CM | POA: Insufficient documentation

## 2021-05-14 DIAGNOSIS — F439 Reaction to severe stress, unspecified: Secondary | ICD-10-CM

## 2021-05-14 DIAGNOSIS — K219 Gastro-esophageal reflux disease without esophagitis: Secondary | ICD-10-CM

## 2021-05-14 DIAGNOSIS — J329 Chronic sinusitis, unspecified: Secondary | ICD-10-CM

## 2021-05-14 DIAGNOSIS — D649 Anemia, unspecified: Secondary | ICD-10-CM | POA: Diagnosis not present

## 2021-05-14 DIAGNOSIS — E7800 Pure hypercholesterolemia, unspecified: Secondary | ICD-10-CM | POA: Diagnosis not present

## 2021-05-14 DIAGNOSIS — R739 Hyperglycemia, unspecified: Secondary | ICD-10-CM

## 2021-05-14 DIAGNOSIS — Z1231 Encounter for screening mammogram for malignant neoplasm of breast: Secondary | ICD-10-CM | POA: Diagnosis not present

## 2021-05-14 DIAGNOSIS — G4730 Sleep apnea, unspecified: Secondary | ICD-10-CM

## 2021-05-14 DIAGNOSIS — Z1211 Encounter for screening for malignant neoplasm of colon: Secondary | ICD-10-CM

## 2021-05-14 LAB — CBC WITH DIFFERENTIAL/PLATELET
Basophils Absolute: 0 10*3/uL (ref 0.0–0.1)
Basophils Relative: 0.7 % (ref 0.0–3.0)
Eosinophils Absolute: 0.2 10*3/uL (ref 0.0–0.7)
Eosinophils Relative: 2.9 % (ref 0.0–5.0)
HCT: 41.7 % (ref 36.0–46.0)
Hemoglobin: 13.8 g/dL (ref 12.0–15.0)
Lymphocytes Relative: 29.4 % (ref 12.0–46.0)
Lymphs Abs: 2.1 10*3/uL (ref 0.7–4.0)
MCHC: 33.2 g/dL (ref 30.0–36.0)
MCV: 93.4 fl (ref 78.0–100.0)
Monocytes Absolute: 0.4 10*3/uL (ref 0.1–1.0)
Monocytes Relative: 6 % (ref 3.0–12.0)
Neutro Abs: 4.3 10*3/uL (ref 1.4–7.7)
Neutrophils Relative %: 61 % (ref 43.0–77.0)
Platelets: 291 10*3/uL (ref 150.0–400.0)
RBC: 4.47 Mil/uL (ref 3.87–5.11)
RDW: 13.1 % (ref 11.5–15.5)
WBC: 7.1 10*3/uL (ref 4.0–10.5)

## 2021-05-14 LAB — LIPID PANEL
Cholesterol: 215 mg/dL — ABNORMAL HIGH (ref 0–200)
HDL: 45.2 mg/dL (ref >39.00)
LDL Cholesterol: 140 mg/dL — ABNORMAL HIGH (ref 0–99)
NonHDL: 169.76
Total CHOL/HDL Ratio: 5
Triglycerides: 148 mg/dL (ref 0.0–149.0)
VLDL: 29.6 mg/dL (ref 0.0–40.0)

## 2021-05-14 LAB — COMPREHENSIVE METABOLIC PANEL
ALT: 14 U/L (ref 0–35)
AST: 18 U/L (ref 0–37)
Albumin: 4.1 g/dL (ref 3.5–5.2)
Alkaline Phosphatase: 83 U/L (ref 39–117)
BUN: 26 mg/dL — ABNORMAL HIGH (ref 6–23)
CO2: 25 mEq/L (ref 19–32)
Calcium: 9.1 mg/dL (ref 8.4–10.5)
Chloride: 105 mEq/L (ref 96–112)
Creatinine, Ser: 0.94 mg/dL (ref 0.40–1.20)
GFR: 72.52 mL/min (ref >60.00)
Glucose, Bld: 93 mg/dL (ref 70–99)
Potassium: 4 mEq/L (ref 3.5–5.1)
Sodium: 139 mEq/L (ref 135–145)
Total Bilirubin: 0.6 mg/dL (ref 0.2–1.2)
Total Protein: 7.2 g/dL (ref 6.0–8.3)

## 2021-05-14 LAB — TSH: TSH: 2.89 u[IU]/mL (ref 0.35–5.50)

## 2021-05-14 MED ORDER — CEFDINIR 300 MG PO CAPS: 300.0000 mg | ORAL_CAPSULE | Freq: Two times a day (BID) | ORAL | 0 refills | Status: DC

## 2021-05-14 MED ORDER — FLUCONAZOLE 150 MG PO TABS: ORAL_TABLET | ORAL | 0 refills | Status: DC

## 2021-05-14 NOTE — Assessment & Plan Note (Addendum)
Physical today 05/14/21.  Mammogram 06/12/20 - Birads I. Schedule f/u mammogram.  PAP today 05/14/21  ?Addendum (correction) - had colonoscopy 2021.  Recommended f/u in 10 years.  Will need to cancel GI referral.   ?

## 2021-05-14 NOTE — Patient Instructions (Signed)
Examples of probiotics - align, culturelle or florastor ? ?Take a probiotic daily while on the antibiotic and for two weeks after completing the antibiotic. ?

## 2021-05-14 NOTE — Progress Notes (Addendum)
Patient ID: Melissa Higgins, female   DOB: 25-Jun-1975, 45 y.o.   MRN: [id number] ? ? ?Subjective:  ? ? Patient ID: Melissa Higgins, female    DOB: 10-20-1975, 46 y.o.   MRN: [id number] ? ?This visit occurred during the SARS-CoV-2 public health emergency.  Safety protocols were in place, including screening questions prior to the visit, additional usage of staff PPE, and extensive cleaning of exam room while observing appropriate contact time as indicated for disinfecting solutions.  ? ?Patient here for her physical exam.  ? ?Chief Complaint  ?Patient presents with  ? Annual Exam  ?  CPE  ? .  ? ?HPI ?Increased stress at work.  Discussed.  Seeing Dr Nicolasa Ducking.  Doing well on current medication regimen.  Has adjusted diet.  Lost weight.  Stays active.  No chest pain or sob reported.  No abdominal pain or bowel change reported.  Does report increased sinus pressure.  Previous sore throat.  Symptoms - c/w sinus infection.  No chest congestion.  Vaginal dryness as outlined.   ? ? ?Past Medical History:  ?Diagnosis Date  ? C. difficile colitis   ? augment use  ? Depression   ? Dysmenorrhea   ? Environmental allergies   ? allergy shots  ? History of frequent urinary tract infections   ? Hypercholesterolemia   ? Sleep apnea   ? CPAP used at home  ? ?Past Surgical History:  ?Procedure Laterality Date  ? COLONOSCOPY WITH PROPOFOL N/A 01/18/2019  ? Procedure: COLONOSCOPY WITH PROPOFOL;  Surgeon: Jonathon Bellows, MD;  Location: [facility] ENDOSCOPY;  Service: Gastroenterology;  Laterality: N/A;  ? NASAL SINUS SURGERY  2003  ? UTERINE FIBROID SURGERY  2019  ? ?Family History  ?Problem Relation Age of Onset  ? Hyperlipidemia Mother   ? Asthma Father   ? Hyperlipidemia Father   ? Allergies Father   ? Breast cancer Neg Hx   ? ?Social History  ? ?Socioeconomic History  ? Marital status: Divorced  ?  Spouse name: Not on file  ? Number of children: 0  ? Years of education: college  ? Highest education level: Not on file  ?Occupational History  ? Not  on file  ?Tobacco Use  ? Smoking status: Never  ? Smokeless tobacco: Never  ?Substance and Sexual Activity  ? Alcohol use: Yes  ?  Alcohol/week: 0.0 standard drinks  ?  Comment: occasional  ? Drug use: No  ? Sexual activity: Yes  ?  Birth control/protection: None  ?Other Topics Concern  ? 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  ? ?Social Determinants of Health  ? ?Financial Resource Strain: Not on file  ?Food Insecurity: Not on file  ?Transportation Needs: Not on file  ?Physical Activity: Not on file  ?Stress: Not on file  ?Social Connections: Not on file  ? ? ? ?Review of Systems  ?Constitutional:  Negative for appetite change, fever and unexpected weight change.  ?HENT:  Positive for congestion and postnasal drip. Negative for sinus pressure and sore throat.   ?Eyes:  Negative for pain and visual disturbance.  ?Respiratory:  Negative for cough, chest tightness and shortness of breath.   ?Cardiovascular:  Negative for chest pain, palpitations and leg swelling.  ?Gastrointestinal:  Negative for abdominal pain, diarrhea, nausea and vomiting.  ?Genitourinary:  Negative for difficulty urinating and dysuria.  ?Musculoskeletal:  Negative for joint swelling and myalgias.  ?Skin:  Negative for color change and rash.  ?Neurological:  Negative for dizziness, light-headedness and headaches.  ?Hematological:  Negative for adenopathy. Does not bruise/bleed easily.  ?Psychiatric/Behavioral:  Negative for agitation and dysphoric mood.   ? ?   ?Objective:  ?  ? ?BP 126/88 (BP Location: Left Arm, Patient Position: Sitting, Cuff Size: Small)   Pulse 79   Temp 98 ?F (36.7 ?C) (Temporal)   Resp 13   Ht '5\' 10"'$  (1.778 m)   Wt 232 lb (105.2 kg)   SpO2 96%   BMI 33.29 kg/m?  ?Wt Readings from Last 3 Encounters:  ?05/14/21 232 lb (105.2 kg)  ?05/14/20 260 lb (117.9 kg)  ?03/23/20 275 lb (124.7 kg)  ? ? ?Physical Exam ?Vitals reviewed.  ?Constitutional:   ?   General: She  is not in acute distress. ?   Appearance: Normal appearance. She is well-developed.  ?HENT:  ?   Head: Normocephalic and atraumatic.  ?   Right Ear: External ear normal.  ?   Left Ear: External ear normal.  ?Eyes:  ?   General: No scleral icterus.    ?   Right eye: No discharge.     ?   Left eye: No discharge.  ?   Conjunctiva/sclera: Conjunctivae normal.  ?Neck:  ?   Thyroid: No thyromegaly.  ?Cardiovascular:  ?   Rate and Rhythm: Normal rate and regular rhythm.  ?Pulmonary:  ?   Effort: No tachypnea, accessory muscle usage or respiratory distress.  ?   Breath sounds: Normal breath sounds. No decreased breath sounds, wheezing or rhonchi.  ?Chest:  ?Breasts: ?   Right: No inverted nipple, mass, nipple discharge or tenderness (no axillary adenopathy).  ?   Left: No inverted nipple, mass, nipple discharge or tenderness (no axilarry adenopathy).  ?Abdominal:  ?   General: Bowel sounds are normal.  ?   Palpations: Abdomen is soft.  ?   Tenderness: There is no abdominal tenderness.  ?Genitourinary: ?   Comments: Normal external genitalia.  Vaginal vault without lesions.  Cervix identified.  Pap smear performed.  Could not appreciate any adnexal masses or tenderness.  KOH/wet prep obtained.  ?Musculoskeletal:     ?   General: No swelling or tenderness.  ?   Cervical back: Neck supple.  ?Lymphadenopathy:  ?   Cervical: No cervical adenopathy.  ?Skin: ?   General: Skin is warm.  ?   Findings: No erythema or rash.  ?Neurological:  ?   Mental Status: She is alert and oriented to person, place, and time.  ?Psychiatric:     ?   Mood and Affect: Mood normal.     ?   Behavior: Behavior normal.  ? ? ? ?Outpatient Encounter Medications as of 05/14/2021  ?Medication Sig  ? buPROPion (WELLBUTRIN SR) 100 MG 12 hr tablet Take 100 mg by mouth 2 (two) times daily.  ? cefdinir (OMNICEF) 300 MG capsule Take 1 capsule (300 mg total) by mouth 2 (two) times daily.  ? EPINEPHrine 0.3 mg/0.3 mL IJ SOAJ injection   ? esomeprazole (NEXIUM) 40 MG  capsule TAKE 1 CAPSULE BY MOUTH TWICE DAILY BEFORE MEALS  ? Loratadine 10 MG CAPS Take by mouth.  ? meloxicam (MOBIC) 15 MG tablet TAKE 1 TABLET(15 MG) BY MOUTH DAILY  ? pseudoephedrine (SUDAFED) 120 MG 12 hr tablet Take 120 mg by mouth daily.  ? sertraline (ZOLOFT) 100 MG tablet TAKE 1 TABLET(100 MG) BY MOUTH DAILY  ? traZODone (DESYREL) 50 MG tablet Take  50-100 mg by mouth at bedtime as needed.  ? valACYclovir (VALTREX) 500 MG tablet TAKE 1 TABLET BY MOUTH DAILY  ? [DISCONTINUED] fluconazole (DIFLUCAN) 150 MG tablet Take one tablet x 1.  May repeat x 1 in three days if persistent symptoms  ? fluconazole (DIFLUCAN) 150 MG tablet Take one tablet x 1.  May repeat x 1 in three days if persistent symptoms  ? [DISCONTINUED] cefdinir (OMNICEF) 300 MG capsule Take 1 capsule (300 mg total) by mouth 2 (two) times daily.  ? ?Facility-Administered Encounter Medications as of 05/14/2021  ?Medication  ? betamethasone acetate-betamethasone sodium phosphate (CELESTONE) injection 3 mg  ? betamethasone acetate-betamethasone sodium phosphate (CELESTONE) injection 3 mg  ? betamethasone acetate-betamethasone sodium phosphate (CELESTONE) injection 3 mg  ?  ? ?Lab Results  ?Component Value Date  ? WBC 7.1 05/14/2021  ? HGB 13.8 05/14/2021  ? HCT 41.7 05/14/2021  ? PLT 291.0 05/14/2021  ? GLUCOSE 93 05/14/2021  ? CHOL 215 (H) 05/14/2021  ? TRIG 148.0 05/14/2021  ? HDL 45.20 05/14/2021  ? LDLCALC 140 (H) 05/14/2021  ? ALT 14 05/14/2021  ? AST 18 05/14/2021  ? NA 139 05/14/2021  ? K 4.0 05/14/2021  ? CL 105 05/14/2021  ? CREATININE 0.94 05/14/2021  ? BUN 26 (H) 05/14/2021  ? CO2 25 05/14/2021  ? TSH 2.89 05/14/2021  ? HGBA1C 5.7 03/05/2020  ? ? ?MM 3D SCREEN BREAST BILATERAL ? ?Result Date: 06/12/2020 ?CLINICAL DATA:  Screening. EXAM: DIGITAL SCREENING BILATERAL MAMMOGRAM WITH TOMOSYNTHESIS AND CAD TECHNIQUE: Bilateral screening digital craniocaudal and mediolateral oblique mammograms were obtained. Bilateral screening digital breast  tomosynthesis was performed. The images were evaluated with computer-aided detection. COMPARISON:  Previous exam(s). ACR Breast Density Category c: The breast tissue is heterogeneously dense, which may obscure small ma

## 2021-05-15 ENCOUNTER — Encounter: Payer: Self-pay | Admitting: Internal Medicine

## 2021-05-15 NOTE — Assessment & Plan Note (Signed)
Continue saline nasal spray/steroid nasal spray.  Mucinex.  Treat with omnicef.  Probiotics as directed.  Follow.  Call with update.   

## 2021-05-15 NOTE — Assessment & Plan Note (Signed)
Low carb diet and exercise.  Follow met b and a1c.  ?

## 2021-05-15 NOTE — Assessment & Plan Note (Signed)
Increased stress.  Discussed.  Seeing Dr Nicolasa Ducking.  Continue wellbutrin, zoloft and trazodone.  Follow.  ?

## 2021-05-15 NOTE — Assessment & Plan Note (Signed)
Continue nexium.  No upper symptoms reported.   

## 2021-05-15 NOTE — Assessment & Plan Note (Signed)
Low cholesterol diet and exercise.  Follow lipid panel.   

## 2021-05-15 NOTE — Assessment & Plan Note (Signed)
Continue cpap.  

## 2021-05-15 NOTE — Assessment & Plan Note (Signed)
Follow cbc.  

## 2021-05-17 ENCOUNTER — Telehealth: Payer: Self-pay | Admitting: Internal Medicine

## 2021-05-17 LAB — CERVICOVAGINAL ANCILLARY ONLY
Bacterial Vaginitis (gardnerella): NEGATIVE
Candida Glabrata: NEGATIVE
Candida Vaginitis: NEGATIVE
Chlamydia: NEGATIVE
Comment: NEGATIVE
Comment: NEGATIVE
Comment: NEGATIVE
Comment: NEGATIVE
Comment: NEGATIVE
Comment: NORMAL
Neisseria Gonorrhea: NEGATIVE
Trichomonas: NEGATIVE

## 2021-05-17 NOTE — Telephone Encounter (Signed)
Patient was seen on 5/5/203, Dr Nicki Reaper  was suppose to but in a referral for a mammogram not colonoscopy. Please cancel colonoscopy referral and put in a referral for mammogram.  ?

## 2021-05-17 NOTE — Telephone Encounter (Signed)
Lm for pt to cb.

## 2021-05-18 ENCOUNTER — Telehealth: Payer: Self-pay

## 2021-05-18 LAB — CYTOLOGY - PAP
Adequacy: ABSENT
Comment: NEGATIVE
Diagnosis: NEGATIVE
High risk HPV: NEGATIVE

## 2021-05-18 NOTE — Telephone Encounter (Signed)
CALLED PATIENT NO ANSWER LEFT VOICEMAIL FOR A CALL BACK ? ?

## 2021-05-18 NOTE — Telephone Encounter (Signed)
CALLED PATIENT NO ANSWER LEFT VOICEMAIL FOR A CALL BACK °Letter sent °

## 2021-05-20 ENCOUNTER — Telehealth: Payer: Self-pay | Admitting: Internal Medicine

## 2021-05-20 NOTE — Telephone Encounter (Signed)
Had placed order for GI referral.  Actually she had colonoscopy 2021 and does not need another one for 10 years (per Dr Allen Norris).  She does need a mammogram scheduled.  Please schedule and notify pt of appt date and time.   ?

## 2021-05-25 NOTE — Telephone Encounter (Signed)
Fyi ? ?Pt sched for mammo 6/15 ay 1:40  ?S/w Jenn , advised ?

## 2021-06-12 ENCOUNTER — Other Ambulatory Visit: Payer: Self-pay | Admitting: Family

## 2021-06-24 ENCOUNTER — Ambulatory Visit
Admission: RE | Admit: 2021-06-24 | Discharge: 2021-06-24 | Disposition: A | Discharge status: Home / Self Care | Payer: BC Managed Care – PPO | Source: Ambulatory Visit | Attending: Internal Medicine | Admitting: Internal Medicine

## 2021-06-24 DIAGNOSIS — Z1231 Encounter for screening mammogram for malignant neoplasm of breast: Secondary | ICD-10-CM | POA: Diagnosis present

## 2021-07-31 ENCOUNTER — Encounter: Payer: Self-pay | Admitting: Internal Medicine

## 2021-08-02 NOTE — Telephone Encounter (Signed)
Can order a met c, cbc and a1c, but if having acute dizziness, falling down, etc - needs to be evaluated before 08/13/21.  Would recommend more acute evaluation.

## 2021-08-02 NOTE — Telephone Encounter (Signed)
S/w pt - agrees to recommendation of eval today. Will go over to [facility]gent care now.  Will keep 8/4 appointment with you for f/u

## 2021-08-13 ENCOUNTER — Encounter: Payer: Self-pay | Admitting: Internal Medicine

## 2021-08-13 ENCOUNTER — Ambulatory Visit: Payer: BC Managed Care – PPO | Admitting: Internal Medicine

## 2021-08-13 ENCOUNTER — Ambulatory Visit: Payer: BC Managed Care – PPO | Admitting: Podiatry

## 2021-08-13 VITALS — BP 118/70 | HR 78 | Temp 98.3°F | Resp 16 | Ht 70.0 in | Wt 236.6 lb

## 2021-08-13 DIAGNOSIS — E7800 Pure hypercholesterolemia, unspecified: Secondary | ICD-10-CM

## 2021-08-13 DIAGNOSIS — R739 Hyperglycemia, unspecified: Secondary | ICD-10-CM

## 2021-08-13 DIAGNOSIS — F439 Reaction to severe stress, unspecified: Secondary | ICD-10-CM

## 2021-08-13 DIAGNOSIS — K219 Gastro-esophageal reflux disease without esophagitis: Secondary | ICD-10-CM | POA: Diagnosis not present

## 2021-08-13 DIAGNOSIS — R051 Acute cough: Secondary | ICD-10-CM

## 2021-08-13 DIAGNOSIS — G4730 Sleep apnea, unspecified: Secondary | ICD-10-CM

## 2021-08-13 DIAGNOSIS — B369 Superficial mycosis, unspecified: Secondary | ICD-10-CM

## 2021-08-13 DIAGNOSIS — R55 Syncope and collapse: Secondary | ICD-10-CM

## 2021-08-13 MED ORDER — AZITHROMYCIN 250 MG PO TABS: ORAL_TABLET | ORAL | 0 refills | Status: AC

## 2021-08-13 MED ORDER — FLUCONAZOLE 150 MG PO TABS: ORAL_TABLET | ORAL | 0 refills | Status: DC

## 2021-08-13 NOTE — Patient Instructions (Signed)
Continue flonase nasal spray as we discussed.  Saline nasal spray  Mucinex in the am and robitussin DM in th evening.    Take a probiotic daily while on the antibiotics and for two weeks after completing antibiotics.    Examples of probiotics:  florastor, culturelle or align  Lotrimin cream - twice a day - between toes.

## 2021-08-13 NOTE — Progress Notes (Unsigned)
Patient ID: Melissa Higgins, female   DOB: 1975/11/07, 46 y.o.   MRN: [id number]   Subjective:    Patient ID: Melissa Higgins, female    DOB: [birth year], 46 y.o.   MRN: [id number]   Patient here for a scheduled follow up.   Marland Kitchen   HPI Seen in ER 08/02/21 - after near syncopal episode.  States - drove 2 hours.  Ate lunch with family.  One hour drive followed.  Felt a little woozy.  Out of car and into friends house.  Legs gave out - fell to floor - on knees.  Denies LOC.  No confusion.  Drank water and ate popsicle.  Head felt foggy.  EKG - ok.  Labs unrevealing.  Since this episode, she has not had any further episodes.  No syncope or near syncope.  No chest pain or sob.  No nausea or vomiting.  Does report recently developing increased cough and congestion.  Increased drainage.  Sore throat.  AM worse.  Increased chest congestion.  Yellow mucus color at times.  No coughing fits.  No chest pain or chest tightness.  Taking sudafed.     Past Medical History:  Diagnosis Date   C. difficile colitis    augment use   Depression    Dysmenorrhea    Environmental allergies    allergy shots   History of frequent urinary tract infections    Hypercholesterolemia    Sleep apnea    CPAP used at home   Past Surgical History:  Procedure Laterality Date   COLONOSCOPY WITH PROPOFOL N/A 01/18/2019   Procedure: COLONOSCOPY WITH PROPOFOL;  Surgeon: Jonathon Bellows, MD;  Location: [facility] ENDOSCOPY;  Service: Gastroenterology;  Laterality: N/A;   NASAL SINUS SURGERY  2003   UTERINE FIBROID SURGERY  2019   Family History  Problem Relation Age of Onset   Hyperlipidemia Mother    Asthma Father    Hyperlipidemia Father    Allergies Father    Breast cancer Neg Hx    Social History   Socioeconomic History   Marital status: Divorced    Spouse name: Not on file   Number of children: 0   Years of education: college   Highest education level: Not on file  Occupational History   Not on file  Tobacco Use    Smoking status: Never   Smokeless tobacco: Never  Substance and Sexual Activity   Alcohol use: Yes    Alcohol/week: 0.0 standard drinks of alcohol    Comment: occasional   Drug use: No   Sexual activity: Yes    Birth control/protection: None  Other Topics Concern   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   Social Determinants of Health   Financial Resource Strain: Not on file  Food Insecurity: Not on file  Transportation Needs: Not on file  Physical Activity: Not on file  Stress: Not on file  Social Connections: Not on file     Review of Systems  Constitutional:  Negative for appetite change, fever and unexpected weight change.  HENT:  Positive for congestion, postnasal drip and sore throat.   Respiratory:  Positive for cough. Negative for chest tightness and shortness of breath.   Cardiovascular:  Negative for chest pain, palpitations and leg swelling.  Gastrointestinal:  Negative for abdominal pain, diarrhea, nausea and vomiting.  Genitourinary:  Negative for difficulty urinating and dysuria.  Musculoskeletal:  Negative for joint  swelling and myalgias.  Skin:  Negative for color change and rash.  Neurological:  Negative for dizziness, light-headedness and headaches.  Psychiatric/Behavioral:  Negative for agitation and dysphoric mood.        Objective:     BP 118/70 (BP Location: Left Arm, Patient Position: Sitting, Cuff Size: Large)   Pulse 78   Temp 98.3 F (36.8 C) (Temporal)   Resp 16   Ht 5' 10"  (1.778 m)   Wt 236 lb 9.6 oz (107.3 kg)   SpO2 98%   BMI 33.95 kg/m  Wt Readings from Last 3 Encounters:  08/13/21 236 lb 9.6 oz (107.3 kg)  05/14/21 232 lb (105.2 kg)  05/14/20 260 lb (117.9 kg)    Physical Exam Vitals reviewed.  Constitutional:      General: She is not in acute distress.    Appearance: Normal appearance.  HENT:     Head: Normocephalic and atraumatic.     Right Ear: External ear  normal.     Left Ear: External ear normal.  Eyes:     General: No scleral icterus.       Right eye: No discharge.        Left eye: No discharge.     Conjunctiva/sclera: Conjunctivae normal.  Neck:     Thyroid: No thyromegaly.  Cardiovascular:     Rate and Rhythm: Normal rate and regular rhythm.  Pulmonary:     Effort: No respiratory distress.     Breath sounds: Normal breath sounds. No wheezing.  Abdominal:     General: Bowel sounds are normal.     Palpations: Abdomen is soft.     Tenderness: There is no abdominal tenderness.  Musculoskeletal:        General: No swelling or tenderness.     Cervical back: Neck supple. No tenderness.  Lymphadenopathy:     Cervical: No cervical adenopathy.  Skin:    Findings: No erythema or rash.  Neurological:     Mental Status: She is alert.  Psychiatric:        Mood and Affect: Mood normal.        Behavior: Behavior normal.      Outpatient Encounter Medications as of 08/13/2021  Medication Sig   azithromycin (ZITHROMAX) 250 MG tablet Take 2 tablets on day 1, then 1 tablet daily on days 2 through 5   buPROPion (WELLBUTRIN SR) 100 MG 12 hr tablet Take 100 mg by mouth 2 (two) times daily.   cefdinir (OMNICEF) 300 MG capsule Take 1 capsule (300 mg total) by mouth 2 (two) times daily.   EPINEPHrine 0.3 mg/0.3 mL IJ SOAJ injection    esomeprazole (NEXIUM) 40 MG capsule TAKE 1 CAPSULE BY MOUTH TWICE DAILY BEFORE MEALS   Loratadine 10 MG CAPS Take by mouth.   meloxicam (MOBIC) 15 MG tablet TAKE 1 TABLET(15 MG) BY MOUTH DAILY   pseudoephedrine (SUDAFED) 120 MG 12 hr tablet Take 120 mg by mouth daily.   sertraline (ZOLOFT) 100 MG tablet TAKE 1 TABLET(100 MG) BY MOUTH DAILY   traZODone (DESYREL) 50 MG tablet Take 50-100 mg by mouth at bedtime as needed.   valACYclovir (VALTREX) 500 MG tablet TAKE 1 TABLET BY MOUTH DAILY   [DISCONTINUED] fluconazole (DIFLUCAN) 150 MG tablet Take one tablet x 1.  May repeat x 1 in three days if persistent symptoms    fluconazole (DIFLUCAN) 150 MG tablet Take one tablet x 1.  May repeat x 1 in three days if persistent symptoms   Facility-Administered  Encounter Medications as of 08/13/2021  Medication   betamethasone acetate-betamethasone sodium phosphate (CELESTONE) injection 3 mg   betamethasone acetate-betamethasone sodium phosphate (CELESTONE) injection 3 mg   betamethasone acetate-betamethasone sodium phosphate (CELESTONE) injection 3 mg     Lab Results  Component Value Date   WBC 7.1 05/14/2021   HGB 13.8 05/14/2021   HCT 41.7 05/14/2021   PLT 291.0 05/14/2021   GLUCOSE 93 05/14/2021   CHOL 215 (H) 05/14/2021   TRIG 148.0 05/14/2021   HDL 45.20 05/14/2021   LDLCALC 140 (H) 05/14/2021   ALT 14 05/14/2021   AST 18 05/14/2021   NA 139 05/14/2021   K 4.0 05/14/2021   CL 105 05/14/2021   CREATININE 0.94 05/14/2021   BUN 26 (H) 05/14/2021   CO2 25 05/14/2021   TSH 2.89 05/14/2021   HGBA1C 5.7 03/05/2020    MM 3D SCREEN BREAST BILATERAL  Result Date: 06/25/2021 CLINICAL DATA:  Screening. EXAM: DIGITAL SCREENING BILATERAL MAMMOGRAM WITH TOMOSYNTHESIS AND CAD TECHNIQUE: Bilateral screening digital craniocaudal and mediolateral oblique mammograms were obtained. Bilateral screening digital breast tomosynthesis was performed. The images were evaluated with computer-aided detection. COMPARISON:  Previous exam(s). ACR Breast Density Category c: The breast tissue is heterogeneously dense, which may obscure small masses. FINDINGS: There are no findings suspicious for malignancy. IMPRESSION: No mammographic evidence of malignancy. A result letter of this screening mammogram will be mailed directly to the patient. RECOMMENDATION: Screening mammogram in one year. (Code:SM-B-01Y) BI-RADS CATEGORY  1: Negative. Electronically Signed   By: Abelardo Diesel M.D.   On: 06/25/2021 14:53       Assessment & Plan:   Problem List Items Addressed This Visit     Cough - Primary    Congestion and cough as outlined.   Lungs clear.  URI - treat with saline nasal flushes and flonase as directed.  mucinex in the am and robitussin DM in the pm.  rx for zpak as directed if symptoms do not improve.  Discussed probiotics if takes abx.  Follow.  Call with update. covid swab. Discussed quarantine guidelines.       Relevant Orders   Novel Coronavirus, NAA ([facility]) (Completed)   GERD (gastroesophageal reflux disease)    Continue nexium.  No upper symptoms reported.        Hypercholesterolemia    Low cholesterol diet and exercise.  Follow lipid panel.       Hyperglycemia    Low carb diet and exercise.  Follow met b and a1c.       Near syncope    Described the recent episode as outlined.  Unclear etiology.  Stays active.  No chest pain, tightness, sob, syncope or near syncope with increased activity or exertion.  EKG and labs unrevealing in ER.  Discussed further w/up.  Desires to monitor for now.  Follow.        Sleep apnea    Continue cpap.       Stress    Seeing Dr Nicolasa Ducking.  Continue wellbutrin, zoloft and trazodone.  Follow.       Superficial fungus infection of skin    Noticed between 4th and 5th toe.  Lotrimin.  Follow.       Relevant Medications   azithromycin (ZITHROMAX) 250 MG tablet   fluconazole (DIFLUCAN) 150 MG tablet     Einar Pheasant, MD

## 2021-08-14 ENCOUNTER — Encounter: Payer: Self-pay | Admitting: Internal Medicine

## 2021-08-14 DIAGNOSIS — R059 Cough, unspecified: Secondary | ICD-10-CM | POA: Insufficient documentation

## 2021-08-14 DIAGNOSIS — R55 Syncope and collapse: Secondary | ICD-10-CM | POA: Insufficient documentation

## 2021-08-14 DIAGNOSIS — B369 Superficial mycosis, unspecified: Secondary | ICD-10-CM | POA: Insufficient documentation

## 2021-08-14 LAB — NOVEL CORONAVIRUS, NAA: SARS-CoV-2, NAA: NOT DETECTED

## 2021-08-14 NOTE — Assessment & Plan Note (Addendum)
Congestion and cough as outlined.  Lungs clear.  URI - treat with saline nasal flushes and flonase as directed.  mucinex in the am and robitussin DM in the pm.  rx for zpak as directed if symptoms do not improve.  Discussed probiotics if takes abx.  Follow.  Call with update. covid swab. Discussed quarantine guidelines.

## 2021-08-14 NOTE — Assessment & Plan Note (Signed)
Described the recent episode as outlined.  Unclear etiology.  Stays active.  No chest pain, tightness, sob, syncope or near syncope with increased activity or exertion.  EKG and labs unrevealing in ER.  Discussed further w/up.  Desires to monitor for now.  Follow.

## 2021-08-14 NOTE — Assessment & Plan Note (Signed)
Continue nexium.  No upper symptoms reported.   

## 2021-08-14 NOTE — Assessment & Plan Note (Signed)
Low carb diet and exercise.  Follow met b and a1c.  

## 2021-08-14 NOTE — Assessment & Plan Note (Signed)
Seeing Dr Kapur.  Continue wellbutrin, zoloft and trazodone.  Follow.  

## 2021-08-14 NOTE — Assessment & Plan Note (Signed)
Noticed between 4th and 5th toe.  Lotrimin.  Follow.

## 2021-08-14 NOTE — Assessment & Plan Note (Signed)
Low cholesterol diet and exercise.  Follow lipid panel.   

## 2021-08-14 NOTE — Assessment & Plan Note (Signed)
Continue cpap.  

## 2021-08-17 ENCOUNTER — Ambulatory Visit: Payer: BC Managed Care – PPO | Admitting: Podiatry

## 2021-08-31 ENCOUNTER — Encounter: Payer: Self-pay | Admitting: Internal Medicine

## 2021-09-15 ENCOUNTER — Other Ambulatory Visit: Payer: Self-pay | Admitting: Internal Medicine

## 2021-09-28 ENCOUNTER — Encounter: Payer: Self-pay | Admitting: Podiatry

## 2021-10-01 ENCOUNTER — Other Ambulatory Visit: Payer: Self-pay | Admitting: Podiatry

## 2021-10-01 MED ORDER — MELOXICAM 15 MG PO TABS: 15.0000 mg | ORAL_TABLET | Freq: Every day | ORAL | 1 refills | Status: DC

## 2021-10-31 ENCOUNTER — Other Ambulatory Visit: Payer: Self-pay | Admitting: Internal Medicine

## 2021-11-11 ENCOUNTER — Ambulatory Visit: Payer: BC Managed Care – PPO | Admitting: Internal Medicine

## 2021-11-11 ENCOUNTER — Encounter: Payer: Self-pay | Admitting: Internal Medicine

## 2021-11-11 VITALS — BP 126/70 | HR 73 | Temp 97.9°F | Resp 17 | Ht 70.0 in | Wt 243.6 lb

## 2021-11-11 DIAGNOSIS — J329 Chronic sinusitis, unspecified: Secondary | ICD-10-CM

## 2021-11-11 DIAGNOSIS — F439 Reaction to severe stress, unspecified: Secondary | ICD-10-CM

## 2021-11-11 DIAGNOSIS — E7800 Pure hypercholesterolemia, unspecified: Secondary | ICD-10-CM

## 2021-11-11 DIAGNOSIS — K219 Gastro-esophageal reflux disease without esophagitis: Secondary | ICD-10-CM | POA: Diagnosis not present

## 2021-11-11 DIAGNOSIS — D649 Anemia, unspecified: Secondary | ICD-10-CM | POA: Diagnosis not present

## 2021-11-11 DIAGNOSIS — R739 Hyperglycemia, unspecified: Secondary | ICD-10-CM

## 2021-11-11 DIAGNOSIS — R55 Syncope and collapse: Secondary | ICD-10-CM

## 2021-11-11 DIAGNOSIS — R0981 Nasal congestion: Secondary | ICD-10-CM

## 2021-11-11 DIAGNOSIS — G4730 Sleep apnea, unspecified: Secondary | ICD-10-CM

## 2021-11-11 MED ORDER — CEFDINIR 300 MG PO CAPS: 300.0000 mg | ORAL_CAPSULE | Freq: Two times a day (BID) | ORAL | 0 refills | Status: DC

## 2021-11-11 NOTE — Progress Notes (Signed)
Patient ID: Melissa Higgins, female   DOB: 05-Aug-1975, 46 y.o.   MRN: [id number]   Subjective:    Patient ID: Melissa Higgins, female    DOB: 02-19-1975, 46 y.o.   MRN: [id number]   Patient here for  Chief Complaint  Patient presents with   Follow-up   .   HPI Here to follow up regarding increased stress, sleep apnea, sinus issues and cholesterol.  Using cpap.  Patent examiner.  On wellbutrin.  Increased stress.  Discussed.  Previously seen ER - after near syncopal episode.  See last note.  No further syncope.  Has noticed some light headedness.  Notices some sensation change after standing.  Sat better.  Now with increased head pressure.  Some sinus pressure. Symptoms started last week.  Taking sudafed/mucinex DM.  Some increased fatigue.  Increased head pressure, sore throat and increased drainage.  No fever.  Appears to be c/w sinus infection.  Using steroid nasal spray, saline nasal spray and humidifier. Received covid vaccine 10/17 and flu vaccine - last Friday.     Past Medical History:  Diagnosis Date   C. difficile colitis    augment use   Depression    Dysmenorrhea    Environmental allergies    allergy shots   History of frequent urinary tract infections    Hypercholesterolemia    Sleep apnea    CPAP used at home   Past Surgical History:  Procedure Laterality Date   COLONOSCOPY WITH PROPOFOL N/A 01/18/2019   Procedure: COLONOSCOPY WITH PROPOFOL;  Surgeon: Jonathon Bellows, MD;  Location: [facility] ENDOSCOPY;  Service: Gastroenterology;  Laterality: N/A;   NASAL SINUS SURGERY  2003   UTERINE FIBROID SURGERY  2019   Family History  Problem Relation Age of Onset   Hyperlipidemia Mother    Asthma Father    Hyperlipidemia Father    Allergies Father    Breast cancer Neg Hx    Social History   Socioeconomic History   Marital status: Divorced    Spouse name: Not on file   Number of children: 0   Years of education: college   Highest education level: Not on file   Occupational History   Not on file  Tobacco Use   Smoking status: Never   Smokeless tobacco: Never  Substance and Sexual Activity   Alcohol use: Yes    Alcohol/week: 0.0 standard drinks of alcohol    Comment: occasional   Drug use: No   Sexual activity: Yes    Birth control/protection: None  Other Topics Concern   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   Social Determinants of Health   Financial Resource Strain: Not on file  Food Insecurity: Not on file  Transportation Needs: Not on file  Physical Activity: Not on file  Stress: Not on file  Social Connections: Not on file     Review of Systems  Constitutional:  Positive for fatigue. Negative for appetite change and unexpected weight change.  HENT:  Positive for congestion, postnasal drip and sinus pressure.   Respiratory:  Negative for cough, chest tightness and shortness of breath.   Cardiovascular:  Negative for chest pain, palpitations and leg swelling.  Gastrointestinal:  Negative for abdominal pain, diarrhea, nausea and vomiting.  Genitourinary:  Negative for difficulty urinating and dysuria.  Musculoskeletal:  Negative for joint swelling and myalgias.  Skin:  Negative for color change and rash.  Neurological:  Increased head pressure with current symptoms.  Sensation when standing as outlined.   Psychiatric/Behavioral:  Negative for agitation.        Increased stress as outlined.        Objective:     BP 126/70 (BP Location: Left Arm, Patient Position: Sitting, Cuff Size: Large)   Pulse 73   Temp 97.9 F (36.6 C) (Temporal)   Resp 17   Ht 5' 10" (1.778 m)   Wt 243 lb 9.6 oz (110.5 kg)   SpO2 97%   BMI 34.95 kg/m  Wt Readings from Last 3 Encounters:  11/11/21 243 lb 9.6 oz (110.5 kg)  08/13/21 236 lb 9.6 oz (107.3 kg)  05/14/21 232 lb (105.2 kg)    Physical Exam   Outpatient Encounter Medications as of 11/11/2021  Medication Sig    buPROPion (WELLBUTRIN SR) 100 MG 12 hr tablet Take 100 mg by mouth 2 (two) times daily.   cefdinir (OMNICEF) 300 MG capsule Take 1 capsule (300 mg total) by mouth 2 (two) times daily.   EPINEPHrine 0.3 mg/0.3 mL IJ SOAJ injection    esomeprazole (NEXIUM) 40 MG capsule TAKE ONE CAPSULE BY MOUTH TWICE DAILY BEFORE MEALS   Loratadine 10 MG CAPS Take by mouth.   meloxicam (MOBIC) 15 MG tablet Take 1 tablet (15 mg total) by mouth daily.   pseudoephedrine (SUDAFED) 120 MG 12 hr tablet Take 120 mg by mouth daily.   sertraline (ZOLOFT) 100 MG tablet TAKE 1 TABLET(100 MG) BY MOUTH DAILY   traZODone (DESYREL) 50 MG tablet Take 50-100 mg by mouth at bedtime as needed.   valACYclovir (VALTREX) 500 MG tablet TAKE 1 TABLET BY MOUTH DAILY   [DISCONTINUED] cefdinir (OMNICEF) 300 MG capsule Take 1 capsule (300 mg total) by mouth 2 (two) times daily.   [DISCONTINUED] fluconazole (DIFLUCAN) 150 MG tablet Take one tablet x 1.  May repeat x 1 in three days if persistent symptoms   Facility-Administered Encounter Medications as of 11/11/2021  Medication   betamethasone acetate-betamethasone sodium phosphate (CELESTONE) injection 3 mg   betamethasone acetate-betamethasone sodium phosphate (CELESTONE) injection 3 mg   betamethasone acetate-betamethasone sodium phosphate (CELESTONE) injection 3 mg     Lab Results  Component Value Date   WBC 7.1 05/14/2021   HGB 13.8 05/14/2021   HCT 41.7 05/14/2021   PLT 291.0 05/14/2021   GLUCOSE 93 05/14/2021   CHOL 215 (H) 05/14/2021   TRIG 148.0 05/14/2021   HDL 45.20 05/14/2021   LDLCALC 140 (H) 05/14/2021   ALT 14 05/14/2021   AST 18 05/14/2021   NA 139 05/14/2021   K 4.0 05/14/2021   CL 105 05/14/2021   CREATININE 0.94 05/14/2021   BUN 26 (H) 05/14/2021   CO2 25 05/14/2021   TSH 2.89 05/14/2021   HGBA1C 5.7 03/05/2020    MM 3D SCREEN BREAST BILATERAL  Result Date: 06/25/2021 CLINICAL DATA:  Screening. EXAM: DIGITAL SCREENING BILATERAL MAMMOGRAM WITH  TOMOSYNTHESIS AND CAD TECHNIQUE: Bilateral screening digital craniocaudal and mediolateral oblique mammograms were obtained. Bilateral screening digital breast tomosynthesis was performed. The images were evaluated with computer-aided detection. COMPARISON:  Previous exam(s). ACR Breast Density Category c: The breast tissue is heterogeneously dense, which may obscure small masses. FINDINGS: There are no findings suspicious for malignancy. IMPRESSION: No mammographic evidence of malignancy. A result letter of this screening mammogram will be mailed directly to the patient. RECOMMENDATION: Screening mammogram in one year. (Code:SM-B-01Y) BI-RADS CATEGORY  1: Negative. Electronically Signed   By: Mallie Darting.D.  On: 06/25/2021 14:53       Assessment & Plan:   Problem List Items Addressed This Visit     Anemia    Follow cbc.       GERD (gastroesophageal reflux disease)    Continue nexium.  No upper symptoms reported.        Hypercholesterolemia    Low cholesterol diet and exercise.  Follow lipid panel.       Hyperglycemia    Low carb diet and exercise.  Follow met b and a1c.       Nasal congestion - Primary   Relevant Orders   Novel Coronavirus, NAA ([facility]) (Completed)   Near syncope    Described the recent episode as outlined.  Unclear etiology.  Stays active.  No chest pain, tightness, sob, syncope or near syncope with increased activity or exertion.  EKG and labs unrevealing in ER.  Discussed further w/up  given persistent intermittent episodes of light headedness/dizziness with recent syncope and now with head pressure - obtain MRI brain.        Sinusitis    Continue saline nasal spray/steroid nasal spray.  Mucinex.  Treat with omnicef.  Probiotics as directed.  Follow.  Call with update.        Relevant Medications   cefdinir (OMNICEF) 300 MG capsule   Sleep apnea    Continue cpap.       Stress    Seeing Dr Nicolasa Ducking.  Continue wellbutrin, zoloft and trazodone.  Follow.          Einar Pheasant, MD

## 2021-11-12 LAB — NOVEL CORONAVIRUS, NAA: SARS-CoV-2, NAA: NOT DETECTED

## 2021-11-16 ENCOUNTER — Encounter: Payer: Self-pay | Admitting: Internal Medicine

## 2021-11-16 DIAGNOSIS — R55 Syncope and collapse: Secondary | ICD-10-CM

## 2021-11-16 NOTE — Telephone Encounter (Signed)
Ok

## 2021-11-17 ENCOUNTER — Telehealth: Payer: Self-pay

## 2021-11-17 ENCOUNTER — Telehealth (INDEPENDENT_AMBULATORY_CARE_PROVIDER_SITE_OTHER): Payer: BC Managed Care – PPO | Admitting: Family

## 2021-11-17 ENCOUNTER — Encounter: Payer: Self-pay | Admitting: Family

## 2021-11-17 DIAGNOSIS — J0110 Acute frontal sinusitis, unspecified: Secondary | ICD-10-CM

## 2021-11-17 MED ORDER — FLUCONAZOLE 150 MG PO TABS: 150.0000 mg | ORAL_TABLET | Freq: Once | ORAL | 1 refills | Status: AC

## 2021-11-17 MED ORDER — DOXYCYCLINE HYCLATE 100 MG PO TABS: 100.0000 mg | ORAL_TABLET | Freq: Two times a day (BID) | ORAL | 0 refills | Status: AC

## 2021-11-17 NOTE — Progress Notes (Signed)
Virtual Visit via Video Note  I connected with@  on 11/19/21 at  4:00 PM EST by a video enabled telemedicine application and verified that I am speaking with the correct person using two identifiers.  Location patient: home Location provider:work  Persons participating in the virtual visit: patient, provider  I discussed the limitations of evaluation and management by telemedicine and the availability of in person appointments. The patient expressed understanding and agreed to proceed.   HPI: Complains of bilateral sore throat, worsening.   Endorses thick nasal congestion, occassional cough  No fever, wheezing, sob, chills  Covid test negative again yesterday  Given zpak 08/2021, levaquin 10/2020  She gets vaginal yeast infection.   She sees Dr Tami Ribas for allergy shots.     She has been taking omnicef 7 days ago. She has taken this before and she feels not helpful.   She reports recurring sinus infections  H/o sinus surgery 2003 She has worked in school where mold was present.   History of GERD, sleep apnea  She was seen 6 days ago by PCP for nasal congestion.  COVID-19 negative  ROS: See pertinent positives and negatives per HPI.    EXAM:  VITALS per patient if applicable: There were no vitals taken for this visit. BP Readings from Last 3 Encounters:  11/11/21 126/70  08/13/21 118/70  05/14/21 126/88   Wt Readings from Last 3 Encounters:  11/11/21 243 lb 9.6 oz (110.5 kg)  08/13/21 236 lb 9.6 oz (107.3 kg)  05/14/21 232 lb (105.2 kg)    GENERAL: alert, oriented, appears well and in no acute distress  HEENT: atraumatic, conjunttiva clear, no obvious abnormalities on inspection of external nose and ears  NECK: normal movements of the head and neck  LUNGS: on inspection no signs of respiratory distress, breathing rate appears normal, no obvious gross SOB, gasping or wheezing  CV: no obvious cyanosis  MS: moves all visible extremities without  noticeable abnormality  PSYCH/NEURO: pleasant and cooperative, no obvious depression or anxiety, speech and thought processing grossly intact  ASSESSMENT AND PLAN:  Discussed the following assessment and plan:  Problem List Items Addressed This Visit       Respiratory   Sinusitis, acute - Primary    Fortunately no systemic features however patient has not improved on Omnicef started 7 days ago.  Long history of allergies, recurrent sinus infection.  We have opted to start doxycycline today for 10 days.  Encouraged her to start probiotics as well.  If no relief from doxycycline, emphasized the importance of a CT sinus and follow-up with ENT for further evaluation.  She verbalized understanding and let me know how she is doing      Relevant Medications   doxycycline (VIBRA-TABS) 100 MG tablet    -we discussed possible serious and likely etiologies, options for evaluation and workup, limitations of telemedicine visit vs in person visit, treatment, treatment risks and precautions. Pt prefers to treat via telemedicine empirically rather then risking or undertaking an in person visit at this moment.  .   I discussed the assessment and treatment plan with the patient. The patient was provided an opportunity to ask questions and all were answered. The patient agreed with the plan and demonstrated an understanding of the instructions.   The patient was advised to call back or seek an in-person evaluation if the symptoms worsen or if the condition fails to improve as anticipated.   Mable Paris, FNP

## 2021-11-17 NOTE — Telephone Encounter (Signed)
Lvm to call back to see of pt can do mcvv early.

## 2021-11-19 NOTE — Assessment & Plan Note (Signed)
Fortunately no systemic features however patient has not improved on Omnicef started 7 days ago.  Long history of allergies, recurrent sinus infection.  We have opted to start doxycycline today for 10 days.  Encouraged her to start probiotics as well.  If no relief from doxycycline, emphasized the importance of a CT sinus and follow-up with ENT for further evaluation.  She verbalized understanding and let me know how she is doing

## 2021-11-19 NOTE — Patient Instructions (Signed)
Start doxycycline.   Please wear sun protective clothing and avoid long periods in the sun as doxycycline can make it easier to develop a sunburn  Ensure to take probiotics while on antibiotics and also for 2 weeks after completion. This can either be by eating yogurt daily or taking a probiotic supplement over the counter such as Culturelle.It is important to re-colonize the gut with good bacteria and also to prevent any diarrheal infections associated with antibiotic use.    If no improvement on doxycycline, please let me know right away so I can obtain a CT sinus as well as arrange follow-up with [facility]

## 2021-11-21 ENCOUNTER — Encounter: Payer: Self-pay | Admitting: Internal Medicine

## 2021-11-21 NOTE — Assessment & Plan Note (Signed)
Seeing Dr Nicolasa Ducking.  Continue wellbutrin, zoloft and trazodone.  Follow.

## 2021-11-21 NOTE — Assessment & Plan Note (Signed)
Low carb diet and exercise.  Follow met b and a1c.  

## 2021-11-21 NOTE — Assessment & Plan Note (Signed)
Follow cbc.  

## 2021-11-21 NOTE — Assessment & Plan Note (Signed)
Continue saline nasal spray/steroid nasal spray.  Mucinex.  Treat with omnicef.  Probiotics as directed.  Follow.  Call with update.

## 2021-11-21 NOTE — Assessment & Plan Note (Signed)
Described the recent episode as outlined.  Unclear etiology.  Stays active.  No chest pain, tightness, sob, syncope or near syncope with increased activity or exertion.  EKG and labs unrevealing in ER.  Discussed further w/up last visit. Wants to monitor.  Follow.

## 2021-11-21 NOTE — Assessment & Plan Note (Signed)
Continue nexium.  No upper symptoms reported.

## 2021-11-21 NOTE — Assessment & Plan Note (Signed)
Low cholesterol diet and exercise.  Follow lipid panel.   

## 2021-11-21 NOTE — Assessment & Plan Note (Signed)
Continue cpap.  

## 2021-11-22 NOTE — Telephone Encounter (Signed)
Please call.

## 2021-11-22 NOTE — Telephone Encounter (Signed)
LM for pt to cb 

## 2021-11-22 NOTE — Telephone Encounter (Signed)
MRI brain

## 2021-11-22 NOTE — Telephone Encounter (Signed)
MRI ordered.  Someone should contact her with appt date and time.

## 2021-11-23 ENCOUNTER — Telehealth: Payer: Self-pay | Admitting: Internal Medicine

## 2021-11-23 NOTE — Telephone Encounter (Signed)
Lft pt vm to call ofc to sch .Thank you!

## 2021-11-30 ENCOUNTER — Telehealth: Payer: Self-pay

## 2021-11-30 ENCOUNTER — Ambulatory Visit
Admission: RE | Admit: 2021-11-30 | Discharge: 2021-11-30 | Disposition: A | Discharge status: Home / Self Care | Payer: BC Managed Care – PPO | Source: Ambulatory Visit | Attending: Internal Medicine | Admitting: Internal Medicine

## 2021-11-30 DIAGNOSIS — R55 Syncope and collapse: Secondary | ICD-10-CM | POA: Diagnosis not present

## 2021-11-30 MED ORDER — GADOBUTROL 1 MMOL/ML IV SOLN
10.0000 mL | Freq: Once | INTRAVENOUS | Status: AC | PRN
  Administered 2021-11-30: 10 mL via INTRAVENOUS

## 2021-12-07 ENCOUNTER — Encounter: Payer: Self-pay | Admitting: Internal Medicine

## 2021-12-07 NOTE — Telephone Encounter (Signed)
Called and left message for her regarding her MRI.  See MRI result note.  Agree with ENT referral if she is agreeable.  Please see MRI result note.

## 2021-12-08 NOTE — Telephone Encounter (Signed)
Patient states she is returning Dr. Randell Patient Scott's call.  I transferred patient to [facility], [place].

## 2021-12-08 NOTE — Telephone Encounter (Signed)
See result note.  

## 2021-12-08 NOTE — Telephone Encounter (Signed)
APPT W/ [facility]QUEEN SCHED 12/18 AT 320PM PT AWARE

## 2021-12-17 ENCOUNTER — Ambulatory Visit: Payer: BC Managed Care – PPO | Admitting: Podiatry

## 2021-12-17 DIAGNOSIS — L84 Corns and callosities: Secondary | ICD-10-CM

## 2021-12-17 NOTE — Progress Notes (Signed)
   Chief Complaint  Patient presents with   [place]    Patient is here for possible tinea pedis left foot.    HPI: 46 y.o. female presenting today for new complaint of pain and tenderness between the fourth and fifth digits of the left foot.  Denies a history of injury.  Patient noticed the pain about 2 weeks ago.  Denies any change in shoe gear or change in activity.  She says that she experiences pain with close toed shoes.   Past Medical History:  Diagnosis Date   C. difficile colitis    augment use   Depression    Dysmenorrhea    Environmental allergies    allergy shots   History of frequent urinary tract infections    Hypercholesterolemia    Sleep apnea    CPAP used at home    Past Surgical History:  Procedure Laterality Date   COLONOSCOPY WITH PROPOFOL N/A 01/18/2019   Procedure: COLONOSCOPY WITH PROPOFOL;  Surgeon: Jonathon Bellows, MD;  Location: [facility] ENDOSCOPY;  Service: Gastroenterology;  Laterality: N/A;   NASAL SINUS SURGERY  2003   UTERINE FIBROID SURGERY  2019    Allergies  Allergen Reactions   Augmentin [Amoxicillin-Pot Clavulanate] Diarrhea   Macrobid [Nitrofurantoin Macrocrystal]    Nitrofurantoin Other (See Comments) and Nausea And Vomiting    Other reaction(s): Unknown     Physical Exam: General: The patient is alert and oriented x3 in no acute distress.  Dermatology: Skin is warm, dry and supple bilateral lower extremities.  Superficial ulcer noted to the base of the medial aspect of the left fifth digit adjacent to the fourth toe.  Superficial wound that measures approximately 0.3 cm in diameter.  There is some maceration around the area.  No purulence.  No drainage.  Clinically no indication of infection  Vascular: Palpable pedal pulses bilaterally. Capillary refill within normal limits.  Negative for any significant edema or erythema  Neurological: Light touch and protective threshold grossly intact  Musculoskeletal Exam: No pedal deformities  noted   Assessment: 1. Heloma molle/superficial wound medial aspect of the left fifth toe interdigital webspace   Plan of Care:  1. Patient evaluated.  2.  Light debridement of the area was performed today and the hyperkeratotic skin and callus tissue around the area was excisionally debrided using a tissue nipper without incident. 3.  Recommend triple antibiotic and a light Band-Aid daily 4.  Recommend wide fitting shoes that do not constrict the toebox area 5.  Return to clinic as needed     Edrick Kins, DPM [facility]  Dr. Edrick Kins, DPM    [address], [place] [address]                Office [phone]  Fax [phone]

## 2021-12-26 ENCOUNTER — Encounter: Payer: Self-pay | Admitting: Internal Medicine

## 2021-12-28 NOTE — Telephone Encounter (Signed)
If agreeable, I would like for her to keep f/u - f/u regarding stress/zoloft, etc.

## 2021-12-29 ENCOUNTER — Other Ambulatory Visit: Payer: Self-pay | Admitting: Family

## 2021-12-30 ENCOUNTER — Encounter: Payer: Self-pay | Admitting: Internal Medicine

## 2021-12-30 ENCOUNTER — Other Ambulatory Visit (HOSPITAL_COMMUNITY)
Admission: RE | Admit: 2021-12-30 | Discharge: 2021-12-30 | Disposition: A | Discharge status: Home / Self Care | Payer: BC Managed Care – PPO | Source: Ambulatory Visit | Attending: Internal Medicine | Admitting: Internal Medicine

## 2021-12-30 ENCOUNTER — Ambulatory Visit: Payer: BC Managed Care – PPO | Admitting: Internal Medicine

## 2021-12-30 VITALS — BP 120/80 | HR 74 | Temp 97.7°F | Resp 18 | Ht 70.0 in | Wt 241.0 lb

## 2021-12-30 DIAGNOSIS — K219 Gastro-esophageal reflux disease without esophagitis: Secondary | ICD-10-CM | POA: Diagnosis not present

## 2021-12-30 DIAGNOSIS — R739 Hyperglycemia, unspecified: Secondary | ICD-10-CM

## 2021-12-30 DIAGNOSIS — D649 Anemia, unspecified: Secondary | ICD-10-CM | POA: Diagnosis not present

## 2021-12-30 DIAGNOSIS — E7800 Pure hypercholesterolemia, unspecified: Secondary | ICD-10-CM

## 2021-12-30 DIAGNOSIS — R0981 Nasal congestion: Secondary | ICD-10-CM

## 2021-12-30 DIAGNOSIS — N898 Other specified noninflammatory disorders of vagina: Secondary | ICD-10-CM | POA: Insufficient documentation

## 2021-12-30 DIAGNOSIS — G4730 Sleep apnea, unspecified: Secondary | ICD-10-CM

## 2021-12-30 DIAGNOSIS — F439 Reaction to severe stress, unspecified: Secondary | ICD-10-CM

## 2021-12-30 LAB — POC URINALSYSI DIPSTICK (AUTOMATED)
Bilirubin, UA: NEGATIVE
Blood, UA: NEGATIVE
Glucose, UA: NEGATIVE
Ketones, UA: NEGATIVE
Leukocytes, UA: NEGATIVE
Nitrite, UA: NEGATIVE
Protein, UA: NEGATIVE
Spec Grav, UA: 1.02 (ref 1.010–1.025)
Urobilinogen, UA: 0.2 E.U./dL
pH, UA: 5.5 (ref 5.0–8.0)

## 2021-12-30 NOTE — Progress Notes (Signed)
Subjective:    Patient ID: Melissa Higgins, female    DOB: [birth year], 46 y.o.   MRN: [id number]  Patient here for  Chief Complaint  Patient presents with   Medical Management of Chronic Issues    f/u regarding stress/zoloft, etc.   Vaginitis    Possible yeast infection-c/o vaginal discharge x 4 days & frequency    HPI Has been having persistent issues with sinus congestion and inflammation.  Saw Dr Tami Ribas.  Placed her on prednisone and saline/abx.  Scheduled for allergy testing.  Sinus symptoms improved.  No dizziness or "weird feelings".  Persistent increased stress.  Discussed.  Counselor.  On wellbutrin and zoloft.  Tries to stay active.  No chest pain or sob reported.  No abdominal pain reported.  Reports vaginal symptoms.     Past Medical History:  Diagnosis Date   C. difficile colitis    augment use   Depression    Dysmenorrhea    Environmental allergies    allergy shots   History of frequent urinary tract infections    Hypercholesterolemia    Sleep apnea    CPAP used at home   Past Surgical History:  Procedure Laterality Date   COLONOSCOPY WITH PROPOFOL N/A 01/18/2019   Procedure: COLONOSCOPY WITH PROPOFOL;  Surgeon: Jonathon Bellows, MD;  Location: [facility] ENDOSCOPY;  Service: Gastroenterology;  Laterality: N/A;   NASAL SINUS SURGERY  2003   UTERINE FIBROID SURGERY  2019   Family History  Problem Relation Age of Onset   Hyperlipidemia Mother    Asthma Father    Hyperlipidemia Father    Allergies Father    Breast cancer Neg Hx    Social History   Socioeconomic History   Marital status: Divorced    Spouse name: Not on file   Number of children: 0   Years of education: college   Highest education level: Not on file  Occupational History   Not on file  Tobacco Use   Smoking status: Never   Smokeless tobacco: Never  Substance and Sexual Activity   Alcohol use: Yes    Alcohol/week: 0.0 standard drinks of alcohol    Comment: occasional   Drug use: No    Sexual activity: Yes    Birth control/protection: None  Other Topics Concern   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   Social Determinants of Health   Financial Resource Strain: Not on file  Food Insecurity: Not on file  Transportation Needs: Not on file  Physical Activity: Not on file  Stress: Not on file  Social Connections: Not on file     Review of Systems  Constitutional:  Negative for appetite change and unexpected weight change.  HENT:  Negative for sinus pressure.        Congestion is better with current treatment.   Respiratory:  Negative for cough, chest tightness and shortness of breath.   Cardiovascular:  Negative for chest pain, palpitations and leg swelling.  Gastrointestinal:  Negative for abdominal pain, diarrhea, nausea and vomiting.  Genitourinary:  Negative for difficulty urinating.       Vaginal symptoms as outlined.    Musculoskeletal:  Negative for joint swelling and myalgias.  Skin:  Negative for color change and rash.  Neurological:  Negative for dizziness and headaches.  Psychiatric/Behavioral:  Negative for agitation.        Increased stress as outlined.  Objective:     BP 120/80   Pulse 74   Temp 97.7 F (36.5 C) (Oral)   Resp 18   Ht _0  (1.778 m)   Wt 241 lb (109.3 kg)   LMP 10/22/2021 (Exact Date)   SpO2 98%   BMI 34.58 kg/m  Wt Readings from Last 3 Encounters:  12/30/21 241 lb (109.3 kg)  11/11/21 243 lb 9.6 oz (110.5 kg)  08/13/21 236 lb 9.6 oz (107.3 kg)    Physical Exam Vitals reviewed.  Constitutional:      General: She is not in acute distress.    Appearance: Normal appearance.  HENT:     Head: Normocephalic and atraumatic.     Right Ear: External ear normal.     Left Ear: External ear normal.  Eyes:     General: No scleral icterus.       Right eye: No discharge.        Left eye: No discharge.     Conjunctiva/sclera: Conjunctivae normal.  Neck:      Thyroid: No thyromegaly.  Cardiovascular:     Rate and Rhythm: Normal rate and regular rhythm.  Pulmonary:     Effort: No respiratory distress.     Breath sounds: Normal breath sounds. No wheezing.  Abdominal:     General: Bowel sounds are normal.     Palpations: Abdomen is soft.     Tenderness: There is no abdominal tenderness.  Genitourinary:    Comments: Normal external genitalia.  Vaginal vault without lesions.  KOH/Wet prep obtained. Could not appreciate any adnexal masses or tenderness.   Musculoskeletal:        General: No swelling or tenderness.     Cervical back: Neck supple. No tenderness.  Lymphadenopathy:     Cervical: No cervical adenopathy.  Skin:    Findings: No erythema or rash.  Neurological:     Mental Status: She is alert.  Psychiatric:        Mood and Affect: Mood normal.        Behavior: Behavior normal.      Outpatient Encounter Medications as of 12/30/2021  Medication Sig   buPROPion (WELLBUTRIN SR) 100 MG 12 hr tablet Take 100 mg by mouth 2 (two) times daily.   EPINEPHrine 0.3 mg/0.3 mL IJ SOAJ injection    esomeprazole (NEXIUM) 40 MG capsule TAKE ONE CAPSULE BY MOUTH TWICE DAILY BEFORE MEALS   gentamicin ointment (GARAMYCIN) 0.1 % Apply topically 3 (three) times daily.   Loratadine 10 MG CAPS Take by mouth.   meloxicam (MOBIC) 15 MG tablet Take 1 tablet (15 mg total) by mouth daily.   predniSONE (STERAPRED UNI-PAK 48 TAB) 10 MG (48) TBPK tablet See admin instructions. see package   pseudoephedrine (SUDAFED) 120 MG 12 hr tablet Take 120 mg by mouth daily.   sertraline (ZOLOFT) 100 MG tablet TAKE 1 TABLET(100 MG) BY MOUTH DAILY   traZODone (DESYREL) 50 MG tablet Take 50-100 mg by mouth at bedtime as needed.   valACYclovir (VALTREX) 500 MG tablet TAKE 1 TABLET BY MOUTH DAILY   [DISCONTINUED] cefdinir (OMNICEF) 300 MG capsule Take 1 capsule (300 mg total) by mouth 2 (two) times daily.   Facility-Administered Encounter Medications as of 12/30/2021   Medication   betamethasone acetate-betamethasone sodium phosphate (CELESTONE) injection 3 mg   betamethasone acetate-betamethasone sodium phosphate (CELESTONE) injection 3 mg   betamethasone acetate-betamethasone sodium phosphate (CELESTONE) injection 3 mg     Lab Results  Component Value Date   WBC  7.1 05/14/2021   HGB 13.8 05/14/2021   HCT 41.7 05/14/2021   PLT 291.0 05/14/2021   GLUCOSE 93 05/14/2021   CHOL 215 (H) 05/14/2021   TRIG 148.0 05/14/2021   HDL 45.20 05/14/2021   LDLCALC 140 (H) 05/14/2021   ALT 14 05/14/2021   AST 18 05/14/2021   NA 139 05/14/2021   K 4.0 05/14/2021   CL 105 05/14/2021   CREATININE 0.94 05/14/2021   BUN 26 (H) 05/14/2021   CO2 25 05/14/2021   TSH 2.89 05/14/2021   HGBA1C 5.7 03/05/2020    MR Brain W Wo Contrast  Result Date: 11/30/2021 CLINICAL DATA:  Provided history: Syncope/presyncope, cerebrovascular cause suspected. Additional history provided by scanning technologist: Patient reports episodes of lightheadedness/dizziness with recent syncopal episode, head pressure. EXAM: MRI HEAD WITHOUT AND WITH CONTRAST TECHNIQUE: Multiplanar, multiecho pulse sequences of the brain and surrounding structures were obtained without and with intravenous contrast. CONTRAST:  81m GADAVIST GADOBUTROL 1 MMOL/ML IV SOLN COMPARISON:  Report from brain MRI 01/24/2003 (images unavailable). FINDINGS: Brain: Cerebral volume is normal. There are a few small nonspecific foci of T2 FLAIR hyperintense signal abnormality scattered within the bilateral frontal lobe subcortical white matter (measuring up to 4 mm). No cortical encephalomalacia is identified. There is no acute infarct. No evidence of an intracranial mass. No chronic intracranial blood products. No extra-axial fluid collection. No midline shift. No pathologic intracranial enhancement identified. Vascular: Maintained flow voids within the proximal large arterial vessels. Skull and upper cervical spine: No focal  suspicious marrow lesion. Sinuses/Orbits: No mass or acute finding within the imaged orbits. Mild mucosal thickening within the bilateral maxillary sinuses. Superimposed 15 mm mucous retention cyst within the right maxillary sinus. Minimal mucosal thickening and fluid scattered within the right ethmoid air cells. IMPRESSION: 1. No evidence of acute intracranial abnormality. 2. There are a few small nonspecific T2 FLAIR hyperintense remote insults within the bilateral frontal lobe white matter (measuring up to 4 mm). 3. Otherwise unremarkable MRI appearance of the brain. 4. Paranasal sinus disease, as described. Electronically Signed   By: KKellie SimmeringD.O.   On: 11/30/2021 18:13       Assessment & Plan:  Vaginal discharge -     POCT Urinalysis Dipstick (Automated) -     Cervicovaginal ancillary only  Anemia, unspecified type Assessment & Plan: Follow cbc.    Gastroesophageal reflux disease, unspecified whether esophagitis present Assessment & Plan: Continue nexium.  No upper symptoms reported.     Hypercholesterolemia Assessment & Plan: Low cholesterol diet and exercise.  Follow lipid panel.    Hyperglycemia Assessment & Plan: Low carb diet and exercise.  Follow met b and a1c.    Nasal congestion Assessment & Plan: Saw ENT.  Saw Dr MTami Ribas  Placed her on prednisone and saline/abx.  Scheduled for allergy testing.  Sinus symptoms improved.    Sleep apnea, unspecified type Assessment & Plan: CPAP   Stress Assessment & Plan: Seeing Dr KNicolasa Ducking  Continue wellbutrin, zoloft and trazodone.  Follow.    Vaginal irritation Assessment & Plan: KOH/wet prep obtained.  Check urine.  Treatment pending results.       CEinar Pheasant MD

## 2021-12-31 LAB — CERVICOVAGINAL ANCILLARY ONLY
Bacterial Vaginitis (gardnerella): NEGATIVE
Candida Glabrata: NEGATIVE
Candida Vaginitis: NEGATIVE
Comment: NEGATIVE
Comment: NEGATIVE
Comment: NEGATIVE

## 2022-01-05 ENCOUNTER — Telehealth: Payer: BC Managed Care – PPO | Admitting: Physician Assistant

## 2022-01-05 DIAGNOSIS — R6889 Other general symptoms and signs: Secondary | ICD-10-CM | POA: Diagnosis not present

## 2022-01-05 MED ORDER — OSELTAMIVIR PHOSPHATE 75 MG PO CAPS: 75.0000 mg | ORAL_CAPSULE | Freq: Two times a day (BID) | ORAL | 0 refills | Status: DC

## 2022-01-05 NOTE — Progress Notes (Signed)
E visit for Flu like symptoms   We are sorry that you are not feeling well.  Here is how we plan to help! Based on what you have shared with me it looks like you may have a respiratory virus that may be influenza.  Influenza or "the flu" is   an infection caused by a respiratory virus. The flu virus is highly contagious and persons who did not receive their yearly flu vaccination may "catch" the flu from close contact.  We have anti-viral medications to treat the viruses that cause this infection. They are not a "cure" and only shorten the course of the infection. These prescriptions are most effective when they are given within the first 2 days of "flu" symptoms. Antiviral medication are indicated if you have a high risk of complications from the flu. You should  also consider an antiviral medication if you are in close contact with someone who is at risk. These medications can help patients avoid complications from the flu  but have side effects that you should know. Possible side effects from Tamiflu or oseltamivir include nausea, vomiting, diarrhea, dizziness, headaches, eye redness, sleep problems or other respiratory symptoms. You should not take Tamiflu if you have an allergy to oseltamivir or any to the ingredients in Tamiflu.  Based upon your symptoms and potential risk factors I have prescribed Oseltamivir (Tamiflu).  It has been sent to your designated pharmacy.  You will take one 75 mg capsule orally twice a day for the next 5 days.  ANYONE WHO HAS FLU SYMPTOMS SHOULD: Stay home. The flu is highly contagious and going out or to work exposes others! Be sure to drink plenty of fluids. Water is fine as well as fruit juices, sodas and electrolyte beverages. You may want to stay away from caffeine or alcohol. If you are nauseated, try taking small sips of liquids. How do you know if you are getting enough fluid? Your urine should be a pale yellow or almost colorless. Get rest. Taking a steamy  shower or using a humidifier may help nasal congestion and ease sore throat pain. Using a saline nasal spray works much the same way. Cough drops, hard candies and sore throat lozenges may ease your cough. Line up a caregiver. Have someone check on you regularly.   GET HELP RIGHT AWAY IF: You cannot keep down liquids or your medications. You become short of breath Your fell like you are going to pass out or loose consciousness. Your symptoms persist after you have completed your treatment plan MAKE SURE YOU  Understand these instructions. Will watch your condition. Will get help right away if you are not doing well or get worse.  Your e-visit answers were reviewed by a board certified advanced clinical practitioner to complete your personal care plan.  Depending on the condition, your plan could have included both over the counter or prescription medications.  If there is a problem please reply  once you have received a response from your provider.  Your safety is important to us.  If you have drug allergies check your prescription carefully.    You can use MyChart to ask questions about today's visit, request a non-urgent call back, or ask for a work or school excuse for 24 hours related to this e-Visit. If it has been greater than 24 hours you will need to follow up with your provider, or enter a new e-Visit to address those concerns.  You will get an e-mail in the next   two days asking about your experience.  I hope that your e-visit has been valuable and will speed your recovery. Thank you for using e-visits.  I have spent 5 minutes in review of e-visit questionnaire, review and updating patient chart, medical decision making and response to patient.   Jeremiah M Kenda Kloehn, PA-C  

## 2022-01-09 ENCOUNTER — Encounter: Payer: Self-pay | Admitting: Internal Medicine

## 2022-01-09 NOTE — Assessment & Plan Note (Signed)
Low cholesterol diet and exercise.  Follow lipid panel.   

## 2022-01-09 NOTE — Assessment & Plan Note (Signed)
Seeing Dr Nicolasa Ducking.  Continue wellbutrin, zoloft and trazodone.  Follow.

## 2022-01-09 NOTE — Assessment & Plan Note (Signed)
Saw ENT.  Saw Dr Tami Ribas.  Placed her on prednisone and saline/abx.  Scheduled for allergy testing.  Sinus symptoms improved.

## 2022-01-09 NOTE — Assessment & Plan Note (Signed)
Follow cbc.  

## 2022-01-09 NOTE — Assessment & Plan Note (Signed)
Continue nexium.  No upper symptoms reported.

## 2022-01-09 NOTE — Assessment & Plan Note (Signed)
CPAP.  

## 2022-01-09 NOTE — Assessment & Plan Note (Signed)
Low carb diet and exercise.  Follow met b and a1c.  

## 2022-01-09 NOTE — Assessment & Plan Note (Signed)
KOH/wet prep obtained.  Check urine.  Treatment pending results.

## 2022-01-18 ENCOUNTER — Telehealth: Payer: Self-pay

## 2022-01-18 NOTE — Telephone Encounter (Signed)
Report received.  Nothing further needed.

## 2022-01-18 NOTE — Telephone Encounter (Signed)
Lm for patient to request that she bring SD card to 01/19/22 visit.  Lm for Brad with [employer] to request report.

## 2022-01-19 ENCOUNTER — Ambulatory Visit: Payer: BC Managed Care – PPO | Admitting: Primary Care

## 2022-01-19 ENCOUNTER — Encounter: Payer: Self-pay | Admitting: Primary Care

## 2022-01-19 VITALS — BP 122/80 | HR 72 | Temp 97.0°F | Ht 70.0 in | Wt 244.0 lb

## 2022-01-19 DIAGNOSIS — G4730 Sleep apnea, unspecified: Secondary | ICD-10-CM

## 2022-01-19 NOTE — Progress Notes (Signed)
Reviewed and agree with assessment/plan.   Chesley Mires, MD [facility] Pulmonary/Critical Care 01/19/2022, 1:22 PM Pager:  [phone]

## 2022-01-19 NOTE — Patient Instructions (Signed)
  Recommendations:  - Resume CPAP use nightly - Elevate head of bed with wedge pillow or focus on side sleeping position - Work on weight loss efforts, aim to get 20-30 mins exercise five times a week - Discuss other causes of fatigue with primary care provider  - Call in 1 month let me know how new mask and wearing CPAP most consistently is working for you, if still struggling we will continue with repeat home sleep study and then refer you to orthodontics for oral appliance. If doing better we can cancel sleep study.   Orders: Densensitization mask fitting Home sleep study   Follow-up 3 months with Eustaquio Maize NP

## 2022-01-19 NOTE — Assessment & Plan Note (Addendum)
-   Patient has mild OSA. She has been struggling with fatigue symptoms. She has not been consistently wearing CPAP d.t mask leaks. Current pressure 8-20cm h20; Residual AHI 0.7/hr. We will have her undergo mask desensitization study and lower max pressure to 15cm h20. If not better we will have patient repeat home sleep study and consider referral to orthodontics for oral appliance.     Recommendations:  - Resume CPAP use nightly - Elevate head of bed with wedge pillow or focus on side sleeping position - Work on weight loss efforts, aim to get 20-30 mins exercise five times a week - Discuss other causes of fatigue with primary care provider   Orders: Densensitization mask fitting Home sleep study   Follow-up 3 months with Eustaquio Maize NP

## 2022-01-19 NOTE — Progress Notes (Signed)
$'@Patient'P$  ID: Melissa Higgins, female    DOB: [birth year], 47 y.o.   MRN: [id number]  Chief Complaint  Patient presents with   Follow-up    Does not use CPAP regularly. Wakes up exhausted. Machine leaks. Has lost 40 pounds since her last visit.     Referring provider: Einar Pheasant, MD  HPI: 47 year-old female, never smoker. PMH significant for OSA.   PMH: UTI, GERD Smoker/ Smoking History: Never smoker Maintenance: None Pt of: DR  Previous [facility] pulmonary encounter: 03/23/2020  - Visit  47 year old female never smoker [facility] office for mild obstructive sleep apnea.  Patient presenting to office today.  Former patient of DR.  Patient with history of mild obstructive sleep apnea and is using her CPAP most nights.  CPAP compliance report listed below:  02/22/2020-03/22/2020-CPAP compliance report-24 out of last 30 days used, 24 those days greater than 4 hours, average usage 7 hours and 15 minutes, APAP setting 8-20, AHI 0.9  Patient is wondering if she would benefit from using a nasal mask.  She feels that she may tolerate this better than the full face that she is using.  Patient is currently working with her primary care provider, therapist as well as psychiatrist to help with management of her depression anxiety symptoms.  She feels that the symptoms have worsened over the past couple months.  She believes this may also be affecting her sleep.   01/19/2022 Patient presents today for overdue follow-up/OSA. She has symptoms of restless sleep and daytime fatigue. She has history of mild OSA and was consistently wearing CPAP until recently. She reports waking up in the middle of the night due to mask/air leaks. She does not feel rested when she wakes up in the morning whether she wears CPAP at night or not. She has lost weight since last visit but overall is up 14 lbs since her original sleep study.  Airview download 12/20/21-01/18/22 Usage 10/30 days; 6 days (20%) Average usage days used  5 hours 10 mins Pressure 8-20cm h20 Airleaks 13.2L/min AHI 0.7  Allergies  Allergen Reactions   Augmentin [Amoxicillin-Pot Clavulanate] Diarrhea    Immunization History  Administered Date(s) Administered   Influenza Split 09/10/2013   Influenza,inj,Quad PF,6+ Mos 10/17/2017, 09/21/2018   Influenza-Unspecified 10/23/2019, 11/05/2021   MMR 06/26/2020   PFIZER(Purple Top)SARS-COV-2 Vaccination 03/09/2019, 04/06/2019, 10/23/2019   PPD Test 05/05/2020    Past Medical History:  Diagnosis Date   C. difficile colitis    augment use   Depression    Dysmenorrhea    Environmental allergies    allergy shots   History of frequent urinary tract infections    Hypercholesterolemia    Sleep apnea    CPAP used at home    Tobacco History: Social History   Tobacco Use  Smoking Status Never  Smokeless Tobacco Never   Counseling given: Not Answered   Outpatient Medications Prior to Visit  Medication Sig Dispense Refill   buPROPion (WELLBUTRIN SR) 100 MG 12 hr tablet Take 100 mg by mouth 2 (two) times daily.     EPINEPHrine 0.3 mg/0.3 mL IJ SOAJ injection      esomeprazole (NEXIUM) 40 MG capsule TAKE ONE CAPSULE BY MOUTH TWICE DAILY BEFORE MEALS 180 capsule 1   gentamicin ointment (GARAMYCIN) 0.1 % Apply topically 3 (three) times daily.     Loratadine 10 MG CAPS Take by mouth.     meloxicam (MOBIC) 15 MG tablet Take 1 tablet (15 mg total) by mouth daily. 90 tablet  1   pseudoephedrine (SUDAFED) 120 MG 12 hr tablet Take 120 mg by mouth daily.     sertraline (ZOLOFT) 100 MG tablet TAKE 1 TABLET(100 MG) BY MOUTH DAILY 90 tablet 1   traZODone (DESYREL) 50 MG tablet Take 50-100 mg by mouth at bedtime as needed.     valACYclovir (VALTREX) 500 MG tablet TAKE 1 TABLET BY MOUTH DAILY 30 tablet 2   predniSONE (STERAPRED UNI-PAK 48 TAB) 10 MG (48) TBPK tablet See admin instructions. see package     oseltamivir (TAMIFLU) 75 MG capsule Take 1 capsule (75 mg total) by mouth 2 (two) times daily. 10  capsule 0   Facility-Administered Medications Prior to Visit  Medication Dose Route Frequency Provider Last Rate Last Admin   betamethasone acetate-betamethasone sodium phosphate (CELESTONE) injection 3 mg  3 mg Intramuscular Once Daylene Katayama M, DPM       betamethasone acetate-betamethasone sodium phosphate (CELESTONE) injection 3 mg  3 mg Intramuscular Once Daylene Katayama M, DPM       betamethasone acetate-betamethasone sodium phosphate (CELESTONE) injection 3 mg  3 mg Intra-articular Once Edrick Kins, DPM        Review of Systems  Review of Systems  Constitutional:  Positive for fatigue.  Respiratory: Negative.    Psychiatric/Behavioral:  Positive for sleep disturbance.     Physical Exam  BP 122/80 (BP Location: Left Arm, Cuff Size: Large)   Pulse 72   Temp (!) 97 F (36.1 C)   Ht '5\' 10"'$  (1.778 m)   Wt 244 lb (110.7 kg)   LMP 10/22/2021 (Exact Date)   SpO2 96%   BMI 35.01 kg/m  Physical Exam Constitutional:      Appearance: Normal appearance.  HENT:     Head: Normocephalic and atraumatic.     Mouth/Throat:     Mouth: Mucous membranes are moist.     Pharynx: Oropharynx is clear.  Cardiovascular:     Rate and Rhythm: Normal rate and regular rhythm.  Pulmonary:     Effort: Pulmonary effort is normal.     Breath sounds: Normal breath sounds.  Musculoskeletal:        General: Normal range of motion.  Skin:    General: Skin is warm and dry.  Neurological:     General: No focal deficit present.     Mental Status: She is alert and oriented to person, place, and time. Mental status is at baseline.  Psychiatric:        Mood and Affect: Mood normal.        Behavior: Behavior normal.        Thought Content: Thought content normal.        Judgment: Judgment normal.      Lab Results:  CBC    Component Value Date/Time   WBC 7.1 05/14/2021 0755   RBC 4.47 05/14/2021 0755   HGB 13.8 05/14/2021 0755   HCT 41.7 05/14/2021 0755   PLT 291.0 05/14/2021 0755   MCV 93.4  05/14/2021 0755   MCHC 33.2 05/14/2021 0755   RDW 13.1 05/14/2021 0755   LYMPHSABS 2.1 05/14/2021 0755   MONOABS 0.4 05/14/2021 0755   EOSABS 0.2 05/14/2021 0755   BASOSABS 0.0 05/14/2021 0755    BMET    Component Value Date/Time   NA 139 05/14/2021 0755   K 4.0 05/14/2021 0755   CL 105 05/14/2021 0755   CO2 25 05/14/2021 0755   GLUCOSE 93 05/14/2021 0755   BUN 26 (H) 05/14/2021 0755  CREATININE 0.94 05/14/2021 0755   CREATININE 0.72 11/25/2016 1608   CALCIUM 9.1 05/14/2021 0755    BNP No results found for: "BNP"  ProBNP No results found for: "PROBNP"  Imaging: No results found.   Assessment & Plan:   Sleep apnea - Patient has mild OSA. She has been struggling with fatigue symptoms. She has not been consistently wearing CPAP d.t mask leaks. Current pressure 8-20cm h20; Residual AHI 0.7/hr. We will have her undergo mask desensitization study and lower max pressure to 15cm h20. If not better we will have patient repeat home sleep study and consider referral to orthodontics for oral appliance.     Recommendations:  - Resume CPAP use nightly - Elevate head of bed with wedge pillow or focus on side sleeping position - Work on weight loss efforts, aim to get 20-30 mins exercise five times a week - Discuss other causes of fatigue with primary care provider   Orders: Densensitization mask fitting Home sleep study   Follow-up 3 months with Beth NP   Martyn Ehrich, NP 01/19/2022

## 2022-02-01 ENCOUNTER — Encounter: Payer: Self-pay | Admitting: Internal Medicine

## 2022-02-04 ENCOUNTER — Encounter: Payer: Self-pay | Admitting: Internal Medicine

## 2022-02-04 ENCOUNTER — Ambulatory Visit: Payer: BC Managed Care – PPO | Admitting: Internal Medicine

## 2022-02-04 VITALS — BP 124/82 | HR 65 | Temp 98.3°F | Resp 17 | Ht 70.0 in | Wt 248.1 lb

## 2022-02-04 DIAGNOSIS — G4730 Sleep apnea, unspecified: Secondary | ICD-10-CM | POA: Diagnosis not present

## 2022-02-04 DIAGNOSIS — N926 Irregular menstruation, unspecified: Secondary | ICD-10-CM | POA: Diagnosis not present

## 2022-02-04 DIAGNOSIS — R55 Syncope and collapse: Secondary | ICD-10-CM

## 2022-02-04 DIAGNOSIS — R5383 Other fatigue: Secondary | ICD-10-CM

## 2022-02-04 DIAGNOSIS — R739 Hyperglycemia, unspecified: Secondary | ICD-10-CM

## 2022-02-04 DIAGNOSIS — K219 Gastro-esophageal reflux disease without esophagitis: Secondary | ICD-10-CM

## 2022-02-04 DIAGNOSIS — E7800 Pure hypercholesterolemia, unspecified: Secondary | ICD-10-CM

## 2022-02-04 DIAGNOSIS — F439 Reaction to severe stress, unspecified: Secondary | ICD-10-CM | POA: Diagnosis not present

## 2022-02-04 DIAGNOSIS — D649 Anemia, unspecified: Secondary | ICD-10-CM

## 2022-02-04 MED ORDER — BUSPIRONE HCL 5 MG PO TABS: 5.0000 mg | ORAL_TABLET | Freq: Two times a day (BID) | ORAL | 1 refills | Status: DC

## 2022-02-04 MED ORDER — VALACYCLOVIR HCL 500 MG PO TABS: 500.0000 mg | ORAL_TABLET | Freq: Every day | ORAL | 2 refills | Status: DC

## 2022-02-04 NOTE — Progress Notes (Unsigned)
Subjective:    Patient ID: Melissa Higgins, female    DOB: 02-13-1975, 47 y.o.   MRN: [id number]  Patient here for  Chief Complaint  Patient presents with   Medication Problem    Discuss trying a new psych-D/C Wellbutrin. Has been very agitated & emotional this week.     HPI Here to discuss her current medication regimen and also increased stress and agitation.  Increased work stress.  Feels she gets agitated a lot.  Feels overwhelmed.  No support from administration at work.  Increased stress and anxiety.  No major depressive symptoms.  She has been seeing psychiatry.  Has been on Zoloft, trazodone and Wellbutrin.  She stopped her Wellbutrin 2 weeks ago.  She felt like the Wellbutrin was aggravating some of her symptoms.  Felt a little better last week, but then this week symptoms have worsened again.  Feel she needs something to help level things out.  She is seeing her counselor regularly.  Discussed increasing the visits.  She just saw her psychiatrist yesterday.  No changes made.  She makes her request today to transfer psychiatrist.  Would like to stay in town if possible.  Increased fatigue.  She is using CPAP regularly.  Compliant.  Seeing pulmonary.  No chest pain.  No syncope.  No abdominal pain or cramping reported.  Did have her menstrual period - 01/14/22 - 01/23/22.     Past Medical History:  Diagnosis Date   C. difficile colitis    augment use   Depression    Dysmenorrhea    Environmental allergies    allergy shots   History of frequent urinary tract infections    Hypercholesterolemia    Sleep apnea    CPAP used at home   Past Surgical History:  Procedure Laterality Date   COLONOSCOPY WITH PROPOFOL N/A 01/18/2019   Procedure: COLONOSCOPY WITH PROPOFOL;  Surgeon: Jonathon Bellows, MD;  Location: [facility] ENDOSCOPY;  Service: Gastroenterology;  Laterality: N/A;   NASAL SINUS SURGERY  2003   UTERINE FIBROID SURGERY  2019   Family History  Problem Relation Age of Onset    Hyperlipidemia Mother    Asthma Father    Hyperlipidemia Father    Allergies Father    Breast cancer Neg Hx    Social History   Socioeconomic History   Marital status: Divorced    Spouse name: Not on file   Number of children: 0   Years of education: college   Highest education level: Not on file  Occupational History   Not on file  Tobacco Use   Smoking status: Never   Smokeless tobacco: Never  Substance and Sexual Activity   Alcohol use: Yes    Alcohol/week: 0.0 standard drinks of alcohol    Comment: occasional   Drug use: No   Sexual activity: Yes    Birth control/protection: None  Other Topics Concern   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   Social Determinants of Health   Financial Resource Strain: Not on file  Food Insecurity: Not on file  Transportation Needs: Not on file  Physical Activity: Not on file  Stress: Not on file  Social Connections: Not on file     Review of Systems  Constitutional:  Positive for fatigue. Negative for appetite change and unexpected weight change.  HENT:  Negative for congestion and sinus pressure.   Respiratory:  Negative for cough, chest  tightness and shortness of breath.   Cardiovascular:  Negative for chest pain, palpitations and leg swelling.  Gastrointestinal:  Negative for abdominal pain, diarrhea, nausea and vomiting.  Genitourinary:  Negative for difficulty urinating and dysuria.  Musculoskeletal:  Negative for joint swelling and myalgias.  Skin:  Negative for color change and rash.  Neurological:  Negative for dizziness and headaches.  Psychiatric/Behavioral:  Positive for agitation.        Increased stress.         Objective:     BP 124/82   Pulse 65   Temp 98.3 F (36.8 C) (Oral)   Resp 17   Ht '5\' 10"'$  (1.778 m)   Wt 248 lb 2 oz (112.5 kg)   LMP 01/14/2022 (Exact Date)   SpO2 97%   BMI 35.60 kg/m  Wt Readings from Last 3 Encounters:   02/04/22 248 lb 2 oz (112.5 kg)  01/19/22 244 lb (110.7 kg)  12/30/21 241 lb (109.3 kg)    Physical Exam Vitals reviewed.  Constitutional:      General: She is not in acute distress.    Appearance: Normal appearance.  HENT:     Head: Normocephalic and atraumatic.     Right Ear: External ear normal.     Left Ear: External ear normal.  Eyes:     General: No scleral icterus.       Right eye: No discharge.        Left eye: No discharge.     Conjunctiva/sclera: Conjunctivae normal.  Neck:     Thyroid: No thyromegaly.  Cardiovascular:     Rate and Rhythm: Normal rate and regular rhythm.  Pulmonary:     Effort: No respiratory distress.     Breath sounds: Normal breath sounds. No wheezing.  Abdominal:     General: Bowel sounds are normal.     Palpations: Abdomen is soft.     Tenderness: There is no abdominal tenderness.  Musculoskeletal:        General: No swelling or tenderness.     Cervical back: Neck supple. No tenderness.  Lymphadenopathy:     Cervical: No cervical adenopathy.  Skin:    Findings: No erythema or rash.  Neurological:     Mental Status: She is alert.  Psychiatric:        Mood and Affect: Mood normal.        Behavior: Behavior normal.      Outpatient Encounter Medications as of 02/04/2022  Medication Sig   busPIRone (BUSPAR) 5 MG tablet Take 1 tablet (5 mg total) by mouth 2 (two) times daily.   EPINEPHrine 0.3 mg/0.3 mL IJ SOAJ injection    esomeprazole (NEXIUM) 40 MG capsule TAKE ONE CAPSULE BY MOUTH TWICE DAILY BEFORE MEALS   gentamicin ointment (GARAMYCIN) 0.1 % Apply topically 3 (three) times daily.   Loratadine 10 MG CAPS Take by mouth.   meloxicam (MOBIC) 15 MG tablet Take 1 tablet (15 mg total) by mouth daily.   pseudoephedrine (SUDAFED) 120 MG 12 hr tablet Take 120 mg by mouth daily.   sertraline (ZOLOFT) 100 MG tablet TAKE 1 TABLET(100 MG) BY MOUTH DAILY   traZODone (DESYREL) 50 MG tablet Take 50-100 mg by mouth at bedtime as needed.    [DISCONTINUED] valACYclovir (VALTREX) 500 MG tablet TAKE 1 TABLET BY MOUTH DAILY   valACYclovir (VALTREX) 500 MG tablet Take 1 tablet (500 mg total) by mouth daily.   [DISCONTINUED] buPROPion (WELLBUTRIN SR) 100 MG 12 hr tablet Take 100 mg  by mouth 2 (two) times daily. (Patient not taking: Reported on 02/04/2022)   [DISCONTINUED] predniSONE (STERAPRED UNI-PAK 48 TAB) 10 MG (48) TBPK tablet See admin instructions. see package   Facility-Administered Encounter Medications as of 02/04/2022  Medication   betamethasone acetate-betamethasone sodium phosphate (CELESTONE) injection 3 mg   betamethasone acetate-betamethasone sodium phosphate (CELESTONE) injection 3 mg   betamethasone acetate-betamethasone sodium phosphate (CELESTONE) injection 3 mg     Lab Results  Component Value Date   WBC 7.1 05/14/2021   HGB 13.8 05/14/2021   HCT 41.7 05/14/2021   PLT 291.0 05/14/2021   GLUCOSE 93 05/14/2021   CHOL 215 (H) 05/14/2021   TRIG 148.0 05/14/2021   HDL 45.20 05/14/2021   LDLCALC 140 (H) 05/14/2021   ALT 14 05/14/2021   AST 18 05/14/2021   NA 139 05/14/2021   K 4.0 05/14/2021   CL 105 05/14/2021   CREATININE 0.94 05/14/2021   BUN 26 (H) 05/14/2021   CO2 25 05/14/2021   TSH 2.89 05/14/2021   HGBA1C 5.7 03/05/2020    No results found.     Assessment & Plan:  Stress Assessment & Plan:  Has been seeing Dr Nicolasa Ducking.  Has been on wellbutrin, zoloft and trazodone.  Increased agitation and stress as outlined.  Stopped wellbutrin two weeks ago.  Symptoms as outlined.  Discussed medication.  Will continue zoloft and trazodone.  Start buspar '5mg'$  bid.  Continue f/u with her counselor - more frequent visits.  She request a new psychiatrist.  Would like to stay in town if possible.  Follow.  Call with update.   Orders: -     Ambulatory referral to Psychiatry  Sleep apnea, unspecified type Assessment & Plan: Continue cpap nightly.  Compliant.     Near syncope Assessment & Plan: No significant  episodes or symptoms. Follow.    Menstrual irregularity Assessment & Plan: Period 01/14/22 - 01/23/22.  First period since October.  Follow.  Keep menstrual diary    Hyperglycemia Assessment & Plan: Low carb diet and exercise.  Follow met b and a1c.    Hypercholesterolemia Assessment & Plan: Low cholesterol diet and exercise.  Follow lipid panel.    Gastroesophageal reflux disease, unspecified whether esophagitis present Assessment & Plan: Continue nexium.  No upper symptoms reported.     Other fatigue Assessment & Plan: Increased fatigue.  May be multifactorial.  Using cpap nightly.  Compliant.  Feel stress is contributing.  Treat as outlined.  Follow.  Exercise.    Anemia, unspecified type Assessment & Plan: Follow cbc.    Other orders -     valACYclovir HCl; Take 1 tablet (500 mg total) by mouth daily.  Dispense: 30 tablet; Refill: 2 -     busPIRone HCl; Take 1 tablet (5 mg total) by mouth 2 (two) times daily.  Dispense: 60 tablet; Refill: 1     Einar Pheasant, MD

## 2022-02-05 ENCOUNTER — Encounter: Payer: Self-pay | Admitting: Internal Medicine

## 2022-02-05 NOTE — Assessment & Plan Note (Signed)
Period 01/14/22 - 01/23/22.  First period since October.  Follow.  Keep menstrual diary

## 2022-02-05 NOTE — Assessment & Plan Note (Signed)
Increased fatigue.  May be multifactorial.  Using cpap nightly.  Compliant.  Feel stress is contributing.  Treat as outlined.  Follow.  Exercise.

## 2022-02-05 NOTE — Assessment & Plan Note (Signed)
No significant episodes or symptoms. Follow.

## 2022-02-05 NOTE — Assessment & Plan Note (Signed)
Follow cbc.  

## 2022-02-05 NOTE — Assessment & Plan Note (Signed)
Continue cpap nightly.  Compliant.

## 2022-02-05 NOTE — Assessment & Plan Note (Signed)
Continue nexium.  No upper symptoms reported.

## 2022-02-05 NOTE — Assessment & Plan Note (Signed)
Low cholesterol diet and exercise.  Follow lipid panel.   

## 2022-02-05 NOTE — Assessment & Plan Note (Signed)
Low carb diet and exercise.  Follow met b and a1c.  

## 2022-02-05 NOTE — Assessment & Plan Note (Signed)
Has been seeing Dr Nicolasa Ducking.  Has been on wellbutrin, zoloft and trazodone.  Increased agitation and stress as outlined.  Stopped wellbutrin two weeks ago.  Symptoms as outlined.  Discussed medication.  Will continue zoloft and trazodone.  Start buspar '5mg'$  bid.  Continue f/u with her counselor - more frequent visits.  She request a new psychiatrist.  Would like to stay in town if possible.  Follow.  Call with update.

## 2022-02-08 ENCOUNTER — Other Ambulatory Visit (HOSPITAL_BASED_OUTPATIENT_CLINIC_OR_DEPARTMENT_OTHER): Payer: BC Managed Care – PPO

## 2022-02-23 IMAGING — MG DIGITAL SCREENING BILAT W/ TOMO W/ CAD
8 series · 8 of 24 positions shown · non-contrast
Comparison: Previous exam(s).

CLINICAL DATA: Screening.

EXAM:
DIGITAL SCREENING BILATERAL MAMMOGRAM WITH TOMO AND CAD

[R MLO synth-2D]
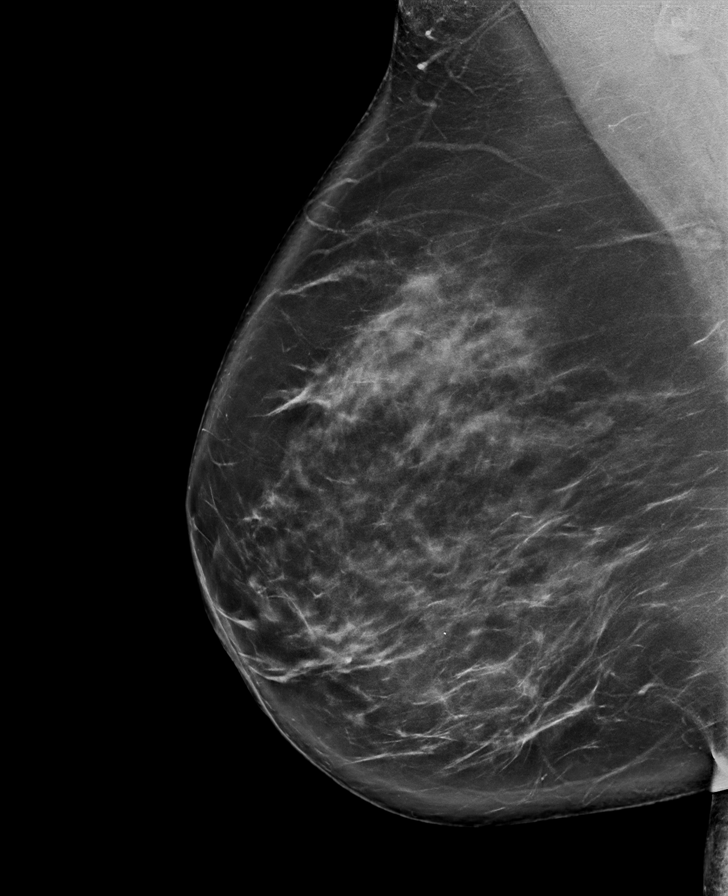

[L MLO synth-2D]
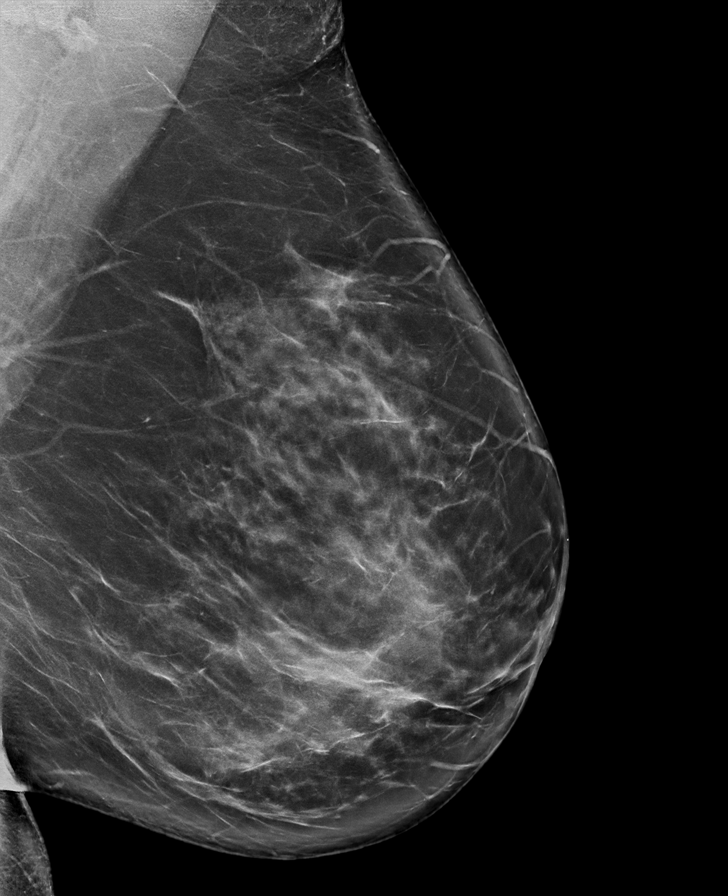

[R CC synth-2D]
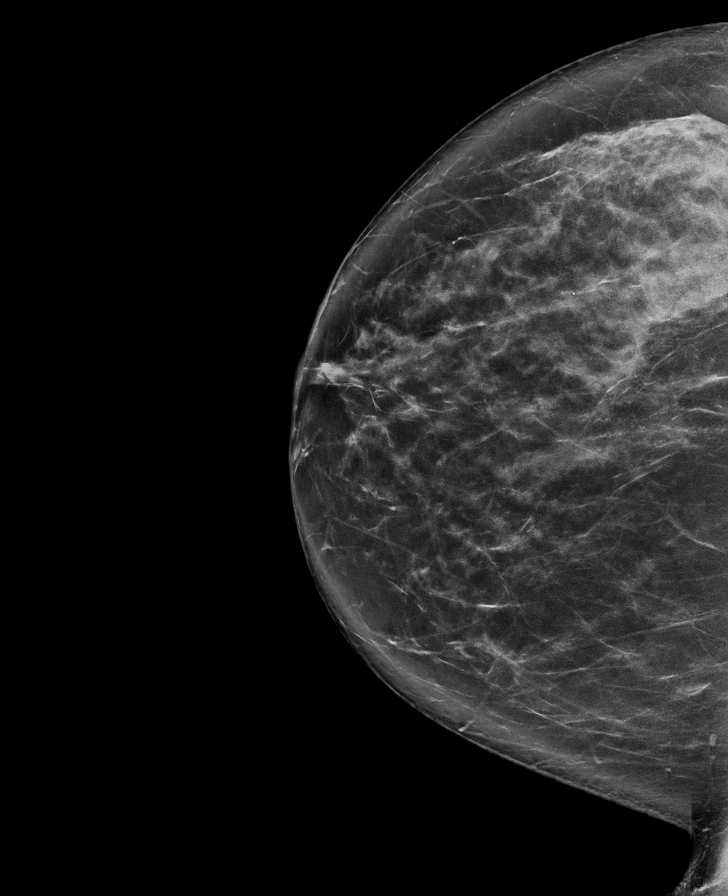

[L CC synth-2D]
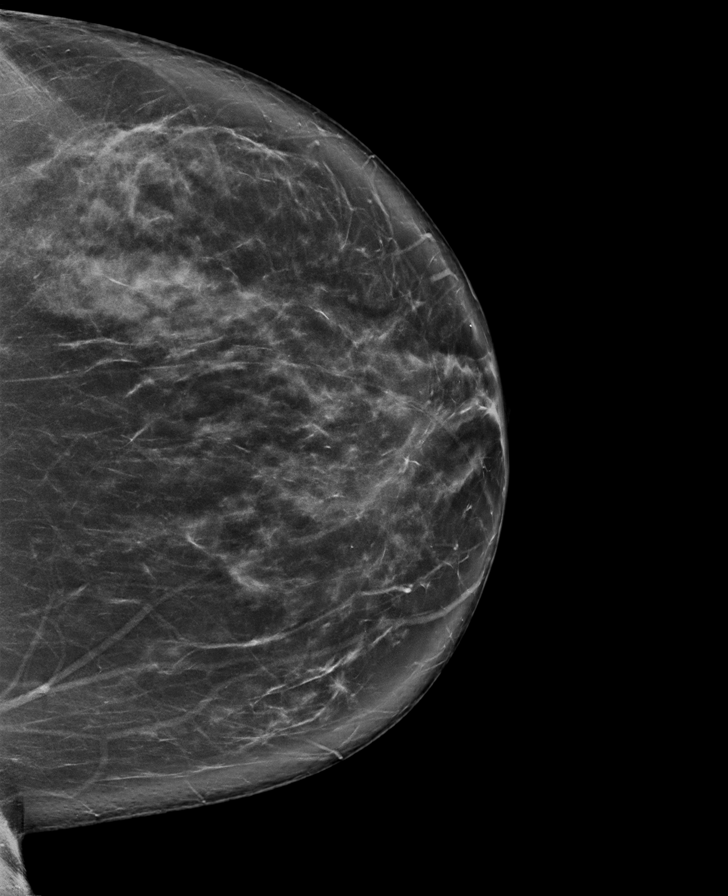

[R CC tomo · tomo slice 49/96.0]
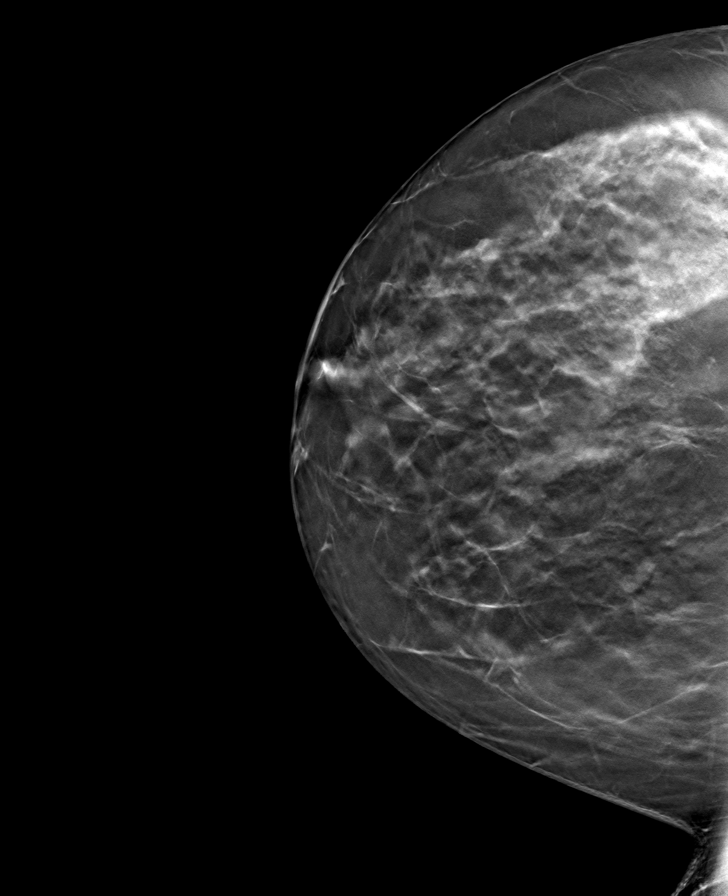

[L MLO tomo · tomo slice 52/103.0]
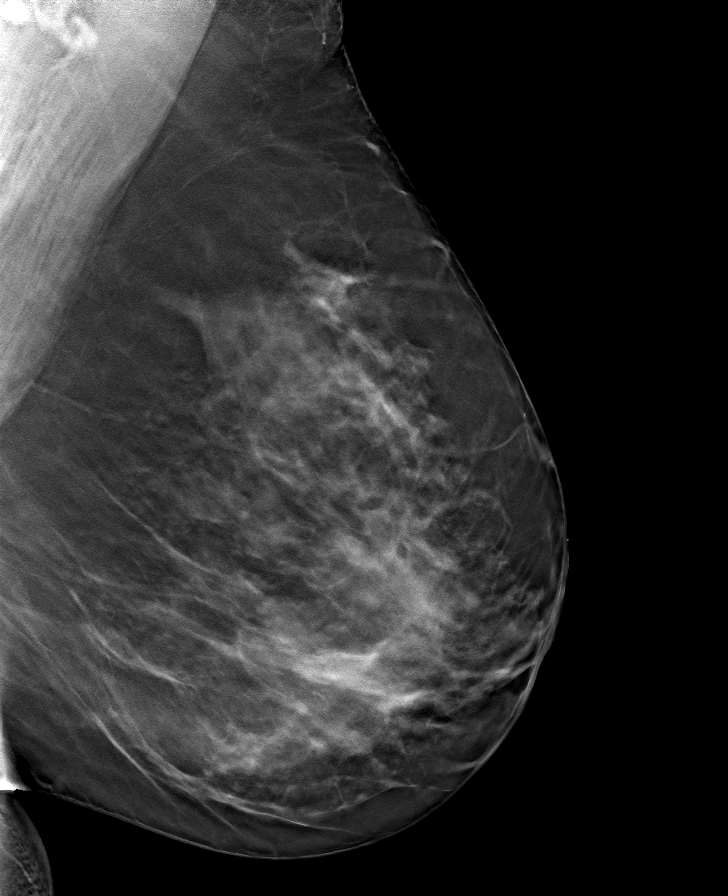

[R MLO tomo · tomo slice 55/109.0]
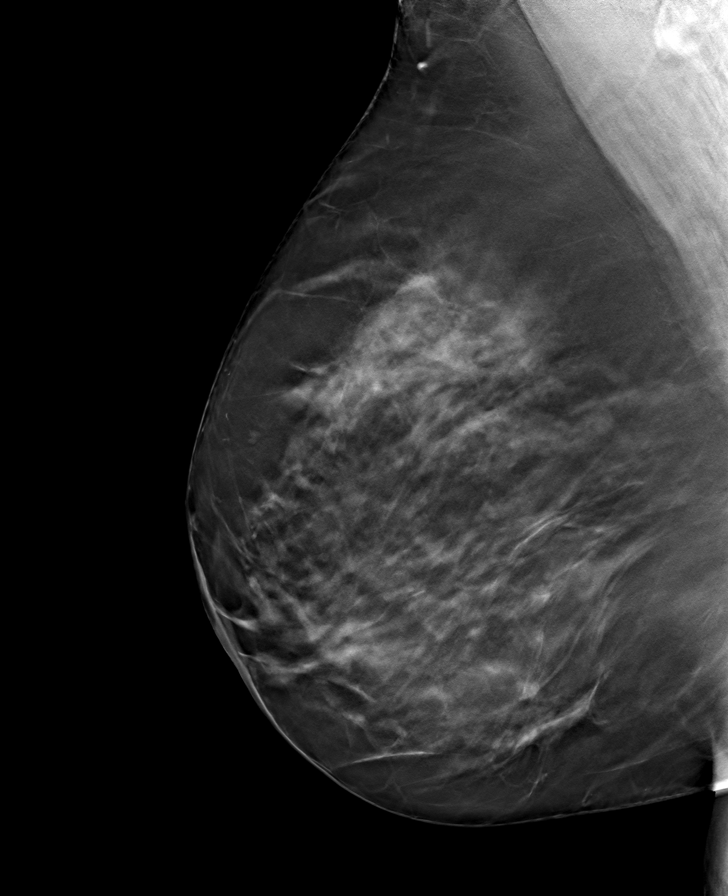

[L CC tomo · tomo slice 49/98.0]
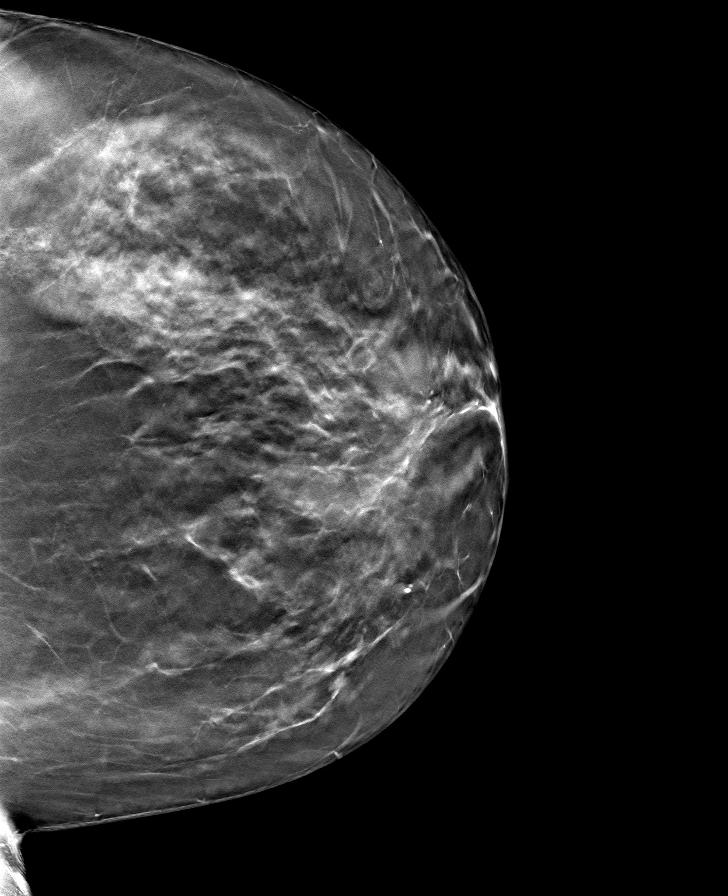

[8 of 24 positions shown; findings below may reference images not displayed]

ACR Breast Density Category c: The breast tissue is heterogeneously
dense, which may obscure small masses.
FINDINGS: There are no findings suspicious for malignancy. Images were
processed with CAD.
IMPRESSION: No mammographic evidence of malignancy. A result letter of this
screening mammogram will be mailed directly to the patient.

RECOMMENDATION:
Screening mammogram in one year. (Code:[id number])

BI-RADS CATEGORY  1: Negative.

## 2022-03-02 ENCOUNTER — Ambulatory Visit (HOSPITAL_BASED_OUTPATIENT_CLINIC_OR_DEPARTMENT_OTHER): Payer: BC Managed Care – PPO | Attending: Primary Care | Admitting: Radiology

## 2022-03-02 DIAGNOSIS — G4730 Sleep apnea, unspecified: Secondary | ICD-10-CM

## 2022-03-22 ENCOUNTER — Ambulatory Visit: Payer: BC Managed Care – PPO

## 2022-03-22 DIAGNOSIS — G4730 Sleep apnea, unspecified: Secondary | ICD-10-CM

## 2022-03-22 DIAGNOSIS — G4733 Obstructive sleep apnea (adult) (pediatric): Secondary | ICD-10-CM

## 2022-03-29 DIAGNOSIS — G4733 Obstructive sleep apnea (adult) (pediatric): Secondary | ICD-10-CM | POA: Diagnosis not present

## 2022-04-15 NOTE — Telephone Encounter (Signed)
HST showed very mild sleep apnea, AHI 6.3/hour. She does not need CPAP treatment unless experiencing excessive daytime sleepiness. If she would like to discuss starting set up virtual visit. If she would like referral for oral appliance we can

## 2022-04-15 NOTE — Telephone Encounter (Signed)
Sleep study has been scanned into her chart.

## 2022-04-26 ENCOUNTER — Encounter: Payer: Self-pay | Admitting: Podiatry

## 2022-04-26 ENCOUNTER — Other Ambulatory Visit: Payer: Self-pay | Admitting: Podiatry

## 2022-04-27 ENCOUNTER — Other Ambulatory Visit: Payer: Self-pay | Admitting: Podiatry

## 2022-04-27 MED ORDER — MELOXICAM 15 MG PO TABS: 15.0000 mg | ORAL_TABLET | Freq: Every day | ORAL | 1 refills | Status: DC

## 2022-05-13 ENCOUNTER — Other Ambulatory Visit: Payer: Self-pay | Admitting: Internal Medicine

## 2022-05-16 ENCOUNTER — Encounter: Payer: BC Managed Care – PPO | Admitting: Internal Medicine

## 2022-05-25 ENCOUNTER — Ambulatory Visit: Payer: Self-pay | Admitting: Psychiatry

## 2022-05-31 ENCOUNTER — Encounter: Payer: Self-pay | Admitting: Internal Medicine

## 2022-05-31 ENCOUNTER — Other Ambulatory Visit: Payer: Self-pay | Admitting: Internal Medicine

## 2022-05-31 DIAGNOSIS — Z1231 Encounter for screening mammogram for malignant neoplasm of breast: Secondary | ICD-10-CM

## 2022-06-01 NOTE — Telephone Encounter (Signed)
Confirmed no acute issues. Patient was started on acid reflux meds and symptoms were controlled for awhile but now having some issues again. Patient also noted a herpes flare so pelvic exam may be necessary. Patient has been scheduled to see Dr Lorin Picket Friday 5/24. Pt agreed to be evaluated asap if any new or worsening symptoms.

## 2022-06-01 NOTE — Telephone Encounter (Signed)
Pt returning call

## 2022-06-01 NOTE — Telephone Encounter (Signed)
LMTCB

## 2022-06-03 ENCOUNTER — Encounter: Payer: Self-pay | Admitting: Internal Medicine

## 2022-06-03 ENCOUNTER — Other Ambulatory Visit (HOSPITAL_COMMUNITY)
Admission: RE | Admit: 2022-06-03 | Discharge: 2022-06-03 | Disposition: A | Discharge status: Home / Self Care | Payer: BC Managed Care – PPO | Source: Ambulatory Visit | Attending: Internal Medicine | Admitting: Internal Medicine

## 2022-06-03 ENCOUNTER — Ambulatory Visit: Payer: BC Managed Care – PPO | Admitting: Internal Medicine

## 2022-06-03 VITALS — BP 106/78 | HR 72 | Temp 98.0°F | Ht 70.0 in | Wt 245.6 lb

## 2022-06-03 DIAGNOSIS — B009 Herpesviral infection, unspecified: Secondary | ICD-10-CM

## 2022-06-03 DIAGNOSIS — R051 Acute cough: Secondary | ICD-10-CM | POA: Diagnosis not present

## 2022-06-03 DIAGNOSIS — N898 Other specified noninflammatory disorders of vagina: Secondary | ICD-10-CM | POA: Insufficient documentation

## 2022-06-03 DIAGNOSIS — R350 Frequency of micturition: Secondary | ICD-10-CM | POA: Diagnosis not present

## 2022-06-03 DIAGNOSIS — G4730 Sleep apnea, unspecified: Secondary | ICD-10-CM

## 2022-06-03 DIAGNOSIS — M7918 Myalgia, other site: Secondary | ICD-10-CM

## 2022-06-03 DIAGNOSIS — R739 Hyperglycemia, unspecified: Secondary | ICD-10-CM

## 2022-06-03 DIAGNOSIS — K219 Gastro-esophageal reflux disease without esophagitis: Secondary | ICD-10-CM | POA: Diagnosis not present

## 2022-06-03 DIAGNOSIS — E7800 Pure hypercholesterolemia, unspecified: Secondary | ICD-10-CM

## 2022-06-03 NOTE — Progress Notes (Unsigned)
Subjective:    Patient ID: Melissa Higgins, female    DOB: [birth year], 47 y.o.   MRN: [id number]  Patient here for  Chief Complaint  Patient presents with   Gastroesophageal Reflux    HPI Here for work in appt.  Work in to discuss increased acid reflux.  Describes sensation in her chest.  Better with TUMS.  Added pepcid to nexium.  Still with persistent symptoms.  Has helped some.  She also reports some increased sinus pressure/nasal congestion.  Increased sore throat.  Some cough and congestion.  No fever.  Had telehealth visit.  Started on doxycycline.  States had "snotty nose" recently, but over the last 24 hours, symptoms have worsened as outlined.  No chest pain or sob with exertion.  No abdominal  pain.  She does report some right low buttock /hip pain.  No known injury.  Some vaginal irritation/discharge. One sore area.     Past Medical History:  Diagnosis Date   C. difficile colitis    augment use   Depression    Dysmenorrhea    Environmental allergies    allergy shots   History of frequent urinary tract infections    Hypercholesterolemia    Sleep apnea    CPAP used at home   Past Surgical History:  Procedure Laterality Date   COLONOSCOPY WITH PROPOFOL N/A 01/18/2019   Procedure: COLONOSCOPY WITH PROPOFOL;  Surgeon: Wyline Mood, MD;  Location: [facility] ENDOSCOPY;  Service: Gastroenterology;  Laterality: N/A;   NASAL SINUS SURGERY  2003   UTERINE FIBROID SURGERY  2019   Family History  Problem Relation Age of Onset   Hyperlipidemia Mother    Asthma Father    Hyperlipidemia Father    Allergies Father    Breast cancer Neg Hx    Social History   Socioeconomic History   Marital status: Divorced    Spouse name: Not on file   Number of children: 0   Years of education: college   Highest education level: Master's degree (e.g., MA, MS, MEng, MEd, MSW, MBA)  Occupational History   Not on file  Tobacco Use   Smoking status: Never   Smokeless tobacco: Never  Substance  and Sexual Activity   Alcohol use: Yes    Alcohol/week: 0.0 standard drinks of alcohol    Comment: occasional   Drug use: No   Sexual activity: Yes    Birth control/protection: None  Other Topics Concern   Not on file  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   Social Determinants of Health   Financial Resource Strain: Low Risk  (06/01/2022)   Overall Financial Resource Strain (CARDIA)    Difficulty of Paying Living Expenses: Not hard at all  Food Insecurity: No Food Insecurity (06/01/2022)   Hunger Vital Sign    Worried About Running Out of Food in the Last Year: Never true    Ran Out of Food in the Last Year: Never true  Transportation Needs: No Transportation Needs (06/01/2022)   PRAPARE - Administrator, Civil Service (Medical): No    Lack of Transportation (Non-Medical): No  Physical Activity: Insufficiently Active (06/01/2022)   Exercise Vital Sign    Days of Exercise per Week: 3 days    Minutes of Exercise per Session: 20 min  Stress: Stress Concern Present (06/01/2022)   [employer] of Occupational Health - Occupational Stress Questionnaire    Feeling of Stress : To  some extent  Social Connections: Moderately Isolated (06/01/2022)   Social Connection and Isolation Panel [NHANES]    Frequency of Communication with Friends and Family: Twice a week    Frequency of Social Gatherings with Friends and Family: Once a week    Attends Religious Services: 1 to 4 times per year    Active Member of [employer] or Organizations: No    Attends Engineer, structural: Not on file    Marital Status: Divorced     Review of Systems  Constitutional:  Negative for appetite change and unexpected weight change.  HENT:  Positive for congestion. Negative for sinus pressure.   Respiratory:  Positive for cough. Negative for chest tightness and shortness of breath.   Cardiovascular:  Negative for chest pain and palpitations.   Gastrointestinal:  Negative for abdominal pain, diarrhea, nausea and vomiting.  Genitourinary:  Positive for vaginal discharge. Negative for difficulty urinating and dysuria.  Musculoskeletal:  Negative for joint swelling and myalgias.       Right buttock pain as outlined.    Skin:  Negative for color change and rash.  Neurological:  Negative for dizziness and headaches.  Psychiatric/Behavioral:  Negative for agitation and dysphoric mood.        Objective:     BP 106/78   Pulse 72   Temp 98 F (36.7 C) (Oral)   Ht 5\' 10"  (1.778 m)   Wt 245 lb 9.6 oz (111.4 kg)   SpO2 96%   BMI 35.24 kg/m  Wt Readings from Last 3 Encounters:  06/03/22 245 lb 9.6 oz (111.4 kg)  03/02/22 244 lb (110.7 kg)  02/04/22 248 lb 2 oz (112.5 kg)    Physical Exam Vitals reviewed.  Constitutional:      General: She is not in acute distress.    Appearance: Normal appearance.  HENT:     Head: Normocephalic and atraumatic.     Right Ear: External ear normal.     Left Ear: External ear normal.  Eyes:     General: No scleral icterus.       Right eye: No discharge.        Left eye: No discharge.     Conjunctiva/sclera: Conjunctivae normal.  Neck:     Thyroid: No thyromegaly.  Cardiovascular:     Rate and Rhythm: Normal rate and regular rhythm.  Pulmonary:     Effort: No respiratory distress.     Breath sounds: Normal breath sounds. No wheezing.  Abdominal:     General: Bowel sounds are normal.     Palpations: Abdomen is soft.     Tenderness: There is no abdominal tenderness.  Genitourinary:    Comments: Normal external genitalia.  Lesion - above vaginal opening. Vaginal vault without lesions.  Blood in vaginal vault.  KOH/wet prep obtained.  Could not appreciate any adnexal masses or tenderness.   Musculoskeletal:        General: No swelling or tenderness.     Cervical back: Neck supple. No tenderness.  Lymphadenopathy:     Cervical: No cervical adenopathy.  Skin:    Findings: No erythema  or rash.  Neurological:     Mental Status: She is alert.  Psychiatric:        Mood and Affect: Mood normal.        Behavior: Behavior normal.      Outpatient Encounter Medications as of 06/03/2022  Medication Sig   azelastine (ASTELIN) 0.1 % nasal spray Place 2 sprays into both nostrils  2 (two) times daily.   benzonatate (TESSALON) 100 MG capsule Take 100 mg by mouth daily at 12 noon.   doxycycline (VIBRA-TABS) 100 MG tablet Take 100 mg by mouth 2 (two) times daily.   EPINEPHrine 0.3 mg/0.3 mL IJ SOAJ injection    esomeprazole (NEXIUM) 40 MG capsule TAKE 1 CAPSULE BY MOUTH TWICE DAILY BEFORE MEALS   famotidine (PEPCID) 40 MG tablet Take 40 mg by mouth 2 (two) times daily as needed.   gentamicin ointment (GARAMYCIN) 0.1 % Apply topically 3 (three) times daily.   Loratadine 10 MG CAPS Take by mouth.   meloxicam (MOBIC) 15 MG tablet Take 1 tablet (15 mg total) by mouth daily.   pseudoephedrine (SUDAFED) 120 MG 12 hr tablet Take 120 mg by mouth daily.   sertraline (ZOLOFT) 100 MG tablet TAKE 1 TABLET(100 MG) BY MOUTH DAILY   traZODone (DESYREL) 50 MG tablet Take 50-100 mg by mouth at bedtime as needed.   valACYclovir (VALTREX) 500 MG tablet Take 1 tablet (500 mg total) by mouth daily.   [DISCONTINUED] busPIRone (BUSPAR) 5 MG tablet Take 1 tablet (5 mg total) by mouth 2 (two) times daily.   Facility-Administered Encounter Medications as of 06/03/2022  Medication   betamethasone acetate-betamethasone sodium phosphate (CELESTONE) injection 3 mg   betamethasone acetate-betamethasone sodium phosphate (CELESTONE) injection 3 mg   betamethasone acetate-betamethasone sodium phosphate (CELESTONE) injection 3 mg     Lab Results  Component Value Date   WBC 7.1 05/14/2021   HGB 13.8 05/14/2021   HCT 41.7 05/14/2021   PLT 291.0 05/14/2021   GLUCOSE 93 05/14/2021   CHOL 215 (H) 05/14/2021   TRIG 148.0 05/14/2021   HDL 45.20 05/14/2021   LDLCALC 140 (H) 05/14/2021   ALT 14 05/14/2021   AST  18 05/14/2021   NA 139 05/14/2021   K 4.0 05/14/2021   CL 105 05/14/2021   CREATININE 0.94 05/14/2021   BUN 26 (H) 05/14/2021   CO2 25 05/14/2021   TSH 2.89 05/14/2021   HGBA1C 5.7 03/05/2020    No results found.     Assessment & Plan:  Vaginal irritation Assessment & Plan: KOH/wet prep obtained.  Check urine.  Treatment pending results.   Orders: -     Cervicovaginal ancillary only -     Herpes simplex virus culture  Acute cough Assessment & Plan: Sinus pressure/congestion, drainage and cough as outlined.  Already on doxycycline.  Given change in symptoms with associated cough and sore throat, nasal swab for covid/flu and RSV.  Continue doxycycline.  Saline nasal spray.  Mucinex.  Delsym if needed for cough.  Follow.    Orders: -     COVID-19, Flu A+B and RSV  Urinary frequency -     Urinalysis, Routine w reflex microscopic -     Urine Culture  Gastroesophageal reflux disease, unspecified whether esophagitis present Assessment & Plan: Continue nexium.  Worsening symptoms as outlined.  Also on pepcid.  Increase nexium to take 20 minutes before breakfast and 30 minutes before evening meal.  Pepcid before bed.  Follow.  Call with update.    Herpes simplex type II infection Assessment & Plan: Has been on acyclovir.  Has been off.  Swab of lesion.  Get her back on prophylaxis acyclovir.     Hypercholesterolemia Assessment & Plan: Low cholesterol diet and exercise.  Follow lipid panel.    Hyperglycemia Assessment & Plan: Low carb diet and exercise.  Follow met b and a1c.    Sleep apnea, unspecified  type Assessment & Plan: Continue cpap nightly.  Compliant.     Vaginal lesion Assessment & Plan: Vaginal lesion as outlined.  History of HSV II.  Restart acyclovir prophylaxis.  Culture obtained.    Right buttock pain Assessment & Plan: Negative SLR.  Tylenol prn.  Stretches.  Follow.    Other orders -     TIQ-NTM -     MICROSCOPIC MESSAGE     Dale Tequesta, MD

## 2022-06-03 NOTE — Patient Instructions (Signed)
Take omeprazole 30 minutes before breakfast and 30 minutes before your evening meal.   Take famotidine 20mg  - before bed

## 2022-06-04 DIAGNOSIS — M7918 Myalgia, other site: Secondary | ICD-10-CM | POA: Insufficient documentation

## 2022-06-04 LAB — URINALYSIS, ROUTINE W REFLEX MICROSCOPIC
Bilirubin Urine: NEGATIVE
Glucose, UA: NEGATIVE
Hyaline Cast: NONE SEEN /LPF
Nitrite: NEGATIVE
RBC / HPF: NONE SEEN /HPF (ref 0–2)
Specific Gravity, Urine: 1.034 (ref 1.001–1.035)
pH: 5.5 (ref 5.0–8.0)

## 2022-06-04 LAB — COVID-19, FLU A+B AND RSV
Influenza A, NAA: NOT DETECTED
Influenza B, NAA: NOT DETECTED
RSV, NAA: NOT DETECTED
SARS-CoV-2, NAA: NOT DETECTED

## 2022-06-04 LAB — URINE CULTURE
MICRO NUMBER:: 15001283
Result:: NO GROWTH
SPECIMEN QUALITY:: ADEQUATE

## 2022-06-04 LAB — MICROSCOPIC MESSAGE

## 2022-06-04 NOTE — Assessment & Plan Note (Signed)
KOH/wet prep obtained.  Check urine.  Treatment pending results.  

## 2022-06-04 NOTE — Assessment & Plan Note (Signed)
Has been on acyclovir.  Has been off.  Swab of lesion.  Get her back on prophylaxis acyclovir.

## 2022-06-04 NOTE — Assessment & Plan Note (Signed)
Negative SLR.  Tylenol prn.  Stretches.  Follow.

## 2022-06-04 NOTE — Assessment & Plan Note (Signed)
Low carb diet and exercise.  Follow met b and a1c.   

## 2022-06-04 NOTE — Assessment & Plan Note (Signed)
Sinus pressure/congestion, drainage and cough as outlined.  Already on doxycycline.  Given change in symptoms with associated cough and sore throat, nasal swab for covid/flu and RSV.  Continue doxycycline.  Saline nasal spray.  Mucinex.  Delsym if needed for cough.  Follow.

## 2022-06-04 NOTE — Assessment & Plan Note (Signed)
Low cholesterol diet and exercise.  Follow lipid panel.   

## 2022-06-04 NOTE — Assessment & Plan Note (Signed)
Continue nexium.  Worsening symptoms as outlined.  Also on pepcid.  Increase nexium to take 20 minutes before breakfast and 30 minutes before evening meal.  Pepcid before bed.  Follow.  Call with update.

## 2022-06-04 NOTE — Assessment & Plan Note (Signed)
Vaginal lesion as outlined.  History of HSV II.  Restart acyclovir prophylaxis.  Culture obtained.

## 2022-06-04 NOTE — Assessment & Plan Note (Signed)
Continue cpap nightly.  Compliant.   

## 2022-06-08 LAB — CERVICOVAGINAL ANCILLARY ONLY
Bacterial Vaginitis (gardnerella): NEGATIVE
Candida Glabrata: NEGATIVE
Candida Vaginitis: POSITIVE — AB
Comment: NEGATIVE
Comment: NEGATIVE
Comment: NEGATIVE

## 2022-06-08 LAB — TIQ-NTM

## 2022-06-08 LAB — HERPES SIMPLEX VIRUS CULTURE

## 2022-06-09 ENCOUNTER — Telehealth: Payer: Self-pay

## 2022-06-09 NOTE — Telephone Encounter (Signed)
-----   Message from [place], MD sent at 06/07/2022  4:46 AM EDT ----- Please call and notify urine culture negative for infection. The urinalysis did reveal some yeast, which is probable coming from the vaginal area.  Recommend monistat 3 - one applicator q hs x 3 nights.  I had sent her a my chart message that her nasal swab was negative.  She was to continue doxycycline.  Please confirm doing better.  Also, need to check with [place].  The vaginal swab is still showing active.  Is this test going to be run?

## 2022-06-10 ENCOUNTER — Encounter: Payer: Self-pay | Admitting: Internal Medicine

## 2022-06-10 ENCOUNTER — Telehealth: Payer: BC Managed Care – PPO | Admitting: Internal Medicine

## 2022-06-10 DIAGNOSIS — R051 Acute cough: Secondary | ICD-10-CM | POA: Diagnosis not present

## 2022-06-10 DIAGNOSIS — N898 Other specified noninflammatory disorders of vagina: Secondary | ICD-10-CM | POA: Diagnosis not present

## 2022-06-10 DIAGNOSIS — K219 Gastro-esophageal reflux disease without esophagitis: Secondary | ICD-10-CM

## 2022-06-10 MED ORDER — FLUCONAZOLE 150 MG PO TABS: ORAL_TABLET | ORAL | 0 refills | Status: DC

## 2022-06-10 MED ORDER — PREDNISONE 10 MG PO TABS
ORAL_TABLET | ORAL | 0 refills | Status: DC
Start: 2022-06-10 — End: 2022-07-07

## 2022-06-10 MED ORDER — DOXYCYCLINE HYCLATE 100 MG PO TABS: 100.0000 mg | ORAL_TABLET | Freq: Two times a day (BID) | ORAL | 0 refills | Status: DC

## 2022-06-10 NOTE — Telephone Encounter (Signed)
Pt returned [place] LPN call. Note below was read to pt. Pt was not clear, so I transferred call to [place].

## 2022-06-10 NOTE — Telephone Encounter (Signed)
See result note.  

## 2022-06-10 NOTE — Progress Notes (Unsigned)
Patient ID: Melissa Higgins, female   DOB: June 24, 1975, 47 y.o.   MRN: [id number]   Virtual Visit via video Note    All issues noted in this document were discussed and addressed.  No physical exam was performed (except for noted visual exam findings with Video Visits).   I connected with Melissa Higgins by a video enabled telemedicine application and verified that I am speaking with the correct person using two identifiers. Location patient: home Location provider: work Persons participating in the virtual visit: patient, provider  The limitations, risks, security and privacy concerns of performing an evaluation and management service by video and the availability of in person appointments have been discussed.  It has also discussed with the patient that there may be a patient responsible charge related to this service. The patient expressed understanding and agreed to proceed.   Reason for visit: work in appt  HPI: Work in with concerns regarding persistent sinus symptoms.  Was seen 06/03/22 - with increased sinus congestion and cough.  Treated with doxycycline.  That evening she did not sleep well.  Increased congestion.  The following day - temp 99.1. Felt better for a couple of days, but symptoms persists.  Still feeling better than she did initially, but still with increased head and nasal congestion.  Some yellow mucus.  Some sore throat.  Chest congestion.  Increased cough.  Last doxycycline yesterday am.  No vomiting or diarrhea.  Taking delsym.  Drinking hot tea.  Taking cough drops, mucinex and sudafed.  Some headache.  Vaginal symptoms have improved.     ROS: See pertinent positives and negatives per HPI.  Past Medical History:  Diagnosis Date   C. difficile colitis    augment use   Depression    Dysmenorrhea    Environmental allergies    allergy shots   History of frequent urinary tract infections    Hypercholesterolemia    Sleep apnea    CPAP used at home    Past  Surgical History:  Procedure Laterality Date   COLONOSCOPY WITH PROPOFOL N/A 01/18/2019   Procedure: COLONOSCOPY WITH PROPOFOL;  Surgeon: Wyline Mood, MD;  Location: [facility] ENDOSCOPY;  Service: Gastroenterology;  Laterality: N/A;   NASAL SINUS SURGERY  2003   UTERINE FIBROID SURGERY  2019    Family History  Problem Relation Age of Onset   Hyperlipidemia Mother    Asthma Father    Hyperlipidemia Father    Allergies Father    Breast cancer Neg Hx     SOCIAL HX: reviewed.    Current Outpatient Medications:    doxycycline (VIBRA-TABS) 100 MG tablet, Take 1 tablet (100 mg total) by mouth 2 (two) times daily., Disp: 14 tablet, Rfl: 0   fluconazole (DIFLUCAN) 150 MG tablet, Take one tablet x 1.  If persistent symptoms after 3 days, may repeat x 1, Disp: 2 tablet, Rfl: 0   predniSONE (DELTASONE) 10 MG tablet, Take 4 tablets x 1 day and then decrease by 1/2 tablet per day until down to zero mg., Disp: 18 tablet, Rfl: 0   azelastine (ASTELIN) 0.1 % nasal spray, Place 2 sprays into both nostrils 2 (two) times daily., Disp: , Rfl:    benzonatate (TESSALON) 100 MG capsule, Take 100 mg by mouth daily at 12 noon., Disp: , Rfl:    EPINEPHrine 0.3 mg/0.3 mL IJ SOAJ injection, , Disp: , Rfl:    esomeprazole (NEXIUM) 40 MG capsule, TAKE 1 CAPSULE BY MOUTH TWICE DAILY BEFORE MEALS, Disp: 180  capsule, Rfl: 1   famotidine (PEPCID) 40 MG tablet, Take 40 mg by mouth 2 (two) times daily as needed., Disp: , Rfl:    gentamicin ointment (GARAMYCIN) 0.1 %, Apply topically 3 (three) times daily., Disp: , Rfl:    Loratadine 10 MG CAPS, Take by mouth., Disp: , Rfl:    meloxicam (MOBIC) 15 MG tablet, Take 1 tablet (15 mg total) by mouth daily., Disp: 90 tablet, Rfl: 1   pseudoephedrine (SUDAFED) 120 MG 12 hr tablet, Take 120 mg by mouth daily., Disp: , Rfl:    sertraline (ZOLOFT) 100 MG tablet, TAKE 1 TABLET(100 MG) BY MOUTH DAILY, Disp: 90 tablet, Rfl: 1   traZODone (DESYREL) 50 MG tablet, Take 50-100 mg by mouth at  bedtime as needed., Disp: , Rfl:    valACYclovir (VALTREX) 500 MG tablet, Take 1 tablet (500 mg total) by mouth daily., Disp: 30 tablet, Rfl: 2  Current Facility-Administered Medications:    betamethasone acetate-betamethasone sodium phosphate (CELESTONE) injection 3 mg, 3 mg, Intramuscular, Once, Evans, Brent M, DPM   betamethasone acetate-betamethasone sodium phosphate (CELESTONE) injection 3 mg, 3 mg, Intramuscular, Once, Evans, Brent M, DPM   betamethasone acetate-betamethasone sodium phosphate (CELESTONE) injection 3 mg, 3 mg, Intra-articular, Once, Evans, Larena Glassman, DPM  EXAM:  GENERAL: alert, oriented, appears well and in no acute distress  HEENT: atraumatic, conjunttiva clear, no obvious abnormalities on inspection of external nose and ears  NECK: normal movements of the head and neck  LUNGS: on inspection no signs of respiratory distress, breathing rate appears normal, no obvious gross SOB, gasping or wheezing  CV: no obvious cyanosis  PSYCH/NEURO: pleasant and cooperative, no obvious depression or anxiety, speech and thought processing grossly intact  ASSESSMENT AND PLAN:  Discussed the following assessment and plan:  Problem List Items Addressed This Visit     Vaginal irritation    Symptoms have improved.  Given abx, diflucan rx - treat yeast infection.       GERD (gastroesophageal reflux disease)    Continue nexium and pepcid.       Cough - Primary    Sinus pressure/congestion, drainage and cough as outlined.  Being treated with doxycycline.  Symptoms have improved, but are persistent.  Will extend out doxycycline x 1 more week.  Add prednisone taper as directed.  Continue saline nasal spray.  Mucinex.  Delsym if needed for cough.  Follow.  Call with update.         Return if symptoms worsen or fail to improve.   I discussed the assessment and treatment plan with the patient. The patient was provided an opportunity to ask questions and all were answered. The  patient agreed with the plan and demonstrated an understanding of the instructions.   The patient was advised to call back or seek an in-person evaluation if the symptoms worsen or if the condition fails to improve as anticipated.    Dale Bingham, MD

## 2022-06-11 ENCOUNTER — Encounter: Payer: Self-pay | Admitting: Internal Medicine

## 2022-06-11 NOTE — Assessment & Plan Note (Signed)
Sinus pressure/congestion, drainage and cough as outlined.  Being treated with doxycycline.  Symptoms have improved, but are persistent.  Will extend out doxycycline x 1 more week.  Add prednisone taper as directed.  Continue saline nasal spray.  Mucinex.  Delsym if needed for cough.  Follow.  Call with update.

## 2022-06-11 NOTE — Assessment & Plan Note (Signed)
Symptoms have improved.  Given abx, diflucan rx - treat yeast infection.

## 2022-06-11 NOTE — Assessment & Plan Note (Signed)
Continue nexium and pepcid.

## 2022-06-29 ENCOUNTER — Encounter: Payer: Self-pay | Admitting: Internal Medicine

## 2022-07-07 ENCOUNTER — Encounter: Payer: Self-pay | Admitting: Internal Medicine

## 2022-07-07 ENCOUNTER — Ambulatory Visit (INDEPENDENT_AMBULATORY_CARE_PROVIDER_SITE_OTHER): Payer: BC Managed Care – PPO | Admitting: Internal Medicine

## 2022-07-07 ENCOUNTER — Other Ambulatory Visit (HOSPITAL_COMMUNITY)
Admission: RE | Admit: 2022-07-07 | Discharge: 2022-07-07 | Disposition: A | Discharge status: Home / Self Care | Payer: BC Managed Care – PPO | Source: Ambulatory Visit | Attending: Internal Medicine | Admitting: Internal Medicine

## 2022-07-07 ENCOUNTER — Ambulatory Visit
Admission: RE | Admit: 2022-07-07 | Discharge: 2022-07-07 | Disposition: A | Discharge status: Home / Self Care | Payer: BC Managed Care – PPO | Source: Ambulatory Visit | Attending: Internal Medicine | Admitting: Internal Medicine

## 2022-07-07 VITALS — BP 108/70 | HR 72 | Temp 97.9°F | Resp 16 | Ht 70.0 in | Wt 242.0 lb

## 2022-07-07 DIAGNOSIS — Z124 Encounter for screening for malignant neoplasm of cervix: Secondary | ICD-10-CM

## 2022-07-07 DIAGNOSIS — Z1231 Encounter for screening mammogram for malignant neoplasm of breast: Secondary | ICD-10-CM | POA: Insufficient documentation

## 2022-07-07 DIAGNOSIS — E7800 Pure hypercholesterolemia, unspecified: Secondary | ICD-10-CM

## 2022-07-07 DIAGNOSIS — Z0000 Encounter for general adult medical examination without abnormal findings: Secondary | ICD-10-CM | POA: Diagnosis not present

## 2022-07-07 DIAGNOSIS — R739 Hyperglycemia, unspecified: Secondary | ICD-10-CM

## 2022-07-07 DIAGNOSIS — R051 Acute cough: Secondary | ICD-10-CM

## 2022-07-07 DIAGNOSIS — F439 Reaction to severe stress, unspecified: Secondary | ICD-10-CM

## 2022-07-07 LAB — CBC WITH DIFFERENTIAL/PLATELET
Basophils Absolute: 0.1 10*3/uL (ref 0.0–0.1)
Basophils Relative: 0.8 % (ref 0.0–3.0)
Eosinophils Absolute: 0.2 10*3/uL (ref 0.0–0.7)
Eosinophils Relative: 2.8 % (ref 0.0–5.0)
HCT: 40.1 % (ref 36.0–46.0)
Hemoglobin: 13.1 g/dL (ref 12.0–15.0)
Lymphocytes Relative: 38 % (ref 12.0–46.0)
Lymphs Abs: 2.9 10*3/uL (ref 0.7–4.0)
MCHC: 32.7 g/dL (ref 30.0–36.0)
MCV: 89.9 fl (ref 78.0–100.0)
Monocytes Absolute: 0.4 10*3/uL (ref 0.1–1.0)
Monocytes Relative: 5.6 % (ref 3.0–12.0)
Neutro Abs: 4 10*3/uL (ref 1.4–7.7)
Neutrophils Relative %: 52.8 % (ref 43.0–77.0)
Platelets: 276 10*3/uL (ref 150.0–400.0)
RBC: 4.46 Mil/uL (ref 3.87–5.11)
RDW: 13.2 % (ref 11.5–15.5)
WBC: 7.6 10*3/uL (ref 4.0–10.5)

## 2022-07-07 LAB — COMPREHENSIVE METABOLIC PANEL
ALT: 10 U/L (ref 0–35)
AST: 15 U/L (ref 0–37)
Albumin: 4 g/dL (ref 3.5–5.2)
Alkaline Phosphatase: 75 U/L (ref 39–117)
BUN: 21 mg/dL (ref 6–23)
CO2: 27 mEq/L (ref 19–32)
Calcium: 9.6 mg/dL (ref 8.4–10.5)
Chloride: 103 mEq/L (ref 96–112)
Creatinine, Ser: 0.78 mg/dL (ref 0.40–1.20)
GFR: 90 mL/min (ref >60.00)
Glucose, Bld: 92 mg/dL (ref 70–99)
Potassium: 3.8 mEq/L (ref 3.5–5.1)
Sodium: 137 mEq/L (ref 135–145)
Total Bilirubin: 0.5 mg/dL (ref 0.2–1.2)
Total Protein: 6.9 g/dL (ref 6.0–8.3)

## 2022-07-07 LAB — TSH: TSH: 2.17 u[IU]/mL (ref 0.35–5.50)

## 2022-07-07 LAB — LIPID PANEL
Cholesterol: 207 mg/dL — ABNORMAL HIGH (ref 0–200)
HDL: 41.1 mg/dL (ref >39.00)
LDL Cholesterol: 143 mg/dL — ABNORMAL HIGH (ref 0–99)
NonHDL: 165.51
Total CHOL/HDL Ratio: 5
Triglycerides: 112 mg/dL (ref 0.0–149.0)
VLDL: 22.4 mg/dL (ref 0.0–40.0)

## 2022-07-07 NOTE — Progress Notes (Unsigned)
Subjective:    Patient ID: Melissa Higgins, female    DOB: Apr 19, 1975, 47 y.o.   MRN: [id number]  Patient here for  Chief Complaint  Patient presents with   Annual Exam    HPI Here for a physical exam. Recently tested positive for covid.  She is doing better.  Feels better.  Congestion better.  Still some minimal dry cough.  No sob.  No nausea or vomiting.  Taste and smell are starting to improve.  Seeing psychiatry and counselor.  Has f/u planned in mid July. Plans to discuss possibly starting medication.  Overall appears to be doing better.  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     Past Medical History:  Diagnosis Date   C. difficile colitis    augment use   Depression    Dysmenorrhea    Environmental allergies    allergy shots   History of frequent urinary tract infections    Hypercholesterolemia    Sleep apnea    CPAP used at home   Past Surgical History:  Procedure Laterality Date   COLONOSCOPY WITH PROPOFOL N/A 01/18/2019   Procedure: COLONOSCOPY WITH PROPOFOL;  Surgeon: Wyline Mood, MD;  Location: [facility] ENDOSCOPY;  Service: Gastroenterology;  Laterality: N/A;   NASAL SINUS SURGERY  2003   UTERINE FIBROID SURGERY  2019   Family History  Problem Relation Age of Onset   Hyperlipidemia Mother    Asthma Father    Hyperlipidemia Father    Allergies Father    Breast cancer Neg Hx    Social History   Socioeconomic History   Marital status: Divorced    Spouse name: Not on file   Number of children: 0   Years of education: college   Highest education level: Master's degree (e.g., MA, MS, MEng, MEd, MSW, MBA)  Occupational History   Not on file  Tobacco Use   Smoking status: Never   Smokeless tobacco: Never  Substance and Sexual Activity   Alcohol use: Yes    Alcohol/week: 0.0 standard drinks of alcohol    Comment: occasional   Drug use: No   Sexual activity: Yes    Birth control/protection: None  Other Topics Concern   Not on  file  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   Social Determinants of Health   Financial Resource Strain: Low Risk  (06/01/2022)   Overall Financial Resource Strain (CARDIA)    Difficulty of Paying Living Expenses: Not hard at all  Food Insecurity: No Food Insecurity (06/01/2022)   Hunger Vital Sign    Worried About Running Out of Food in the Last Year: Never true    Ran Out of Food in the Last Year: Never true  Transportation Needs: No Transportation Needs (06/01/2022)   PRAPARE - Administrator, Civil Service (Medical): No    Lack of Transportation (Non-Medical): No  Physical Activity: Insufficiently Active (06/01/2022)   Exercise Vital Sign    Days of Exercise per Week: 3 days    Minutes of Exercise per Session: 20 min  Stress: Stress Concern Present (06/01/2022)   [employer] of Occupational Health - Occupational Stress Questionnaire    Feeling of Stress : To some extent  Social Connections: Moderately Isolated (06/01/2022)   Social Connection and Isolation Panel [NHANES]    Frequency of Communication with Friends and Family: Twice a week    Frequency of Social  Gatherings with Friends and Family: Once a week    Attends Religious Services: 1 to 4 times per year    Active Member of [employer] or Organizations: No    Attends Engineer, structural: Not on file    Marital Status: Divorced     Review of Systems  Constitutional:  Negative for appetite change and unexpected weight change.  HENT:  Negative for congestion, sinus pressure and sore throat.   Eyes:  Negative for pain and visual disturbance.  Respiratory:  Negative for chest tightness and shortness of breath.        Some residual dry cough.   Cardiovascular:  Negative for chest pain, palpitations and leg swelling.  Gastrointestinal:  Negative for abdominal pain, diarrhea, nausea and vomiting.  Genitourinary:  Negative for difficulty urinating and dysuria.   Musculoskeletal:  Negative for joint swelling and myalgias.  Skin:  Negative for color change and rash.  Neurological:  Negative for dizziness and headaches.  Hematological:  Negative for adenopathy. Does not bruise/bleed easily.  Psychiatric/Behavioral:  Negative for agitation and dysphoric mood.        Objective:     BP 108/70   Pulse 72   Temp 97.9 F (36.6 C)   Resp 16   Ht 5\' 10"  (1.778 m)   Wt 242 lb (109.8 kg)   SpO2 98%   BMI 34.72 kg/m  Wt Readings from Last 3 Encounters:  07/07/22 242 lb (109.8 kg)  06/03/22 245 lb 9.6 oz (111.4 kg)  03/02/22 244 lb (110.7 kg)    Physical Exam Vitals reviewed.  Constitutional:      General: She is not in acute distress.    Appearance: Normal appearance. She is well-developed.  HENT:     Head: Normocephalic and atraumatic.     Right Ear: External ear normal.     Left Ear: External ear normal.  Eyes:     General: No scleral icterus.       Right eye: No discharge.        Left eye: No discharge.     Conjunctiva/sclera: Conjunctivae normal.  Neck:     Thyroid: No thyromegaly.  Cardiovascular:     Rate and Rhythm: Normal rate and regular rhythm.  Pulmonary:     Effort: No tachypnea, accessory muscle usage or respiratory distress.     Breath sounds: Normal breath sounds. No decreased breath sounds or wheezing.  Chest:  Breasts:    Right: No inverted nipple, mass, nipple discharge or tenderness (no axillary adenopathy).     Left: No inverted nipple, mass, nipple discharge or tenderness (no axilarry adenopathy).  Abdominal:     General: Bowel sounds are normal.     Palpations: Abdomen is soft.     Tenderness: There is no abdominal tenderness.  Genitourinary:    Comments: Normal external genitalia.  Vaginal vault without lesions.  Cervix identified.  Pap smear performed.  Could not appreciate any adnexal masses or tenderness.   Musculoskeletal:        General: No swelling or tenderness.     Cervical back: Neck supple.   Lymphadenopathy:     Cervical: No cervical adenopathy.  Skin:    Findings: No erythema or rash.  Neurological:     Mental Status: She is alert and oriented to person, place, and time.  Psychiatric:        Mood and Affect: Mood normal.        Behavior: Behavior normal.      Outpatient  Encounter Medications as of 07/07/2022  Medication Sig   azelastine (ASTELIN) 0.1 % nasal spray Place 2 sprays into both nostrils 2 (two) times daily.   EPINEPHrine 0.3 mg/0.3 mL IJ SOAJ injection    esomeprazole (NEXIUM) 40 MG capsule TAKE 1 CAPSULE BY MOUTH TWICE DAILY BEFORE MEALS   famotidine (PEPCID) 40 MG tablet Take 40 mg by mouth 2 (two) times daily as needed.   gentamicin ointment (GARAMYCIN) 0.1 % Apply topically 3 (three) times daily.   Loratadine 10 MG CAPS Take by mouth.   meloxicam (MOBIC) 15 MG tablet Take 1 tablet (15 mg total) by mouth daily.   pseudoephedrine (SUDAFED) 120 MG 12 hr tablet Take 120 mg by mouth daily.   sertraline (ZOLOFT) 100 MG tablet TAKE 1 TABLET(100 MG) BY MOUTH DAILY   traZODone (DESYREL) 50 MG tablet Take 50-100 mg by mouth at bedtime as needed.   valACYclovir (VALTREX) 500 MG tablet Take 1 tablet (500 mg total) by mouth daily.   [DISCONTINUED] benzonatate (TESSALON) 100 MG capsule Take 100 mg by mouth daily at 12 noon.   [DISCONTINUED] doxycycline (VIBRA-TABS) 100 MG tablet Take 1 tablet (100 mg total) by mouth 2 (two) times daily.   [DISCONTINUED] fluconazole (DIFLUCAN) 150 MG tablet Take one tablet x 1.  If persistent symptoms after 3 days, may repeat x 1   [DISCONTINUED] predniSONE (DELTASONE) 10 MG tablet Take 4 tablets x 1 day and then decrease by 1/2 tablet per day until down to zero mg.   Facility-Administered Encounter Medications as of 07/07/2022  Medication   betamethasone acetate-betamethasone sodium phosphate (CELESTONE) injection 3 mg   betamethasone acetate-betamethasone sodium phosphate (CELESTONE) injection 3 mg   betamethasone  acetate-betamethasone sodium phosphate (CELESTONE) injection 3 mg     Lab Results  Component Value Date   WBC 7.1 05/14/2021   HGB 13.8 05/14/2021   HCT 41.7 05/14/2021   PLT 291.0 05/14/2021   GLUCOSE 93 05/14/2021   CHOL 215 (H) 05/14/2021   TRIG 148.0 05/14/2021   HDL 45.20 05/14/2021   LDLCALC 140 (H) 05/14/2021   ALT 14 05/14/2021   AST 18 05/14/2021   NA 139 05/14/2021   K 4.0 05/14/2021   CL 105 05/14/2021   CREATININE 0.94 05/14/2021   BUN 26 (H) 05/14/2021   CO2 25 05/14/2021   TSH 2.89 05/14/2021   HGBA1C 5.7 03/05/2020    No results found.     Assessment & Plan:  Health care maintenance Assessment & Plan: Physical today 07/07/22.  Mammogram 06/24/21 - Birads I. Schedule f/u mammogram.  PAP 05/14/21 - negative with negative HPV. Had colonoscopy 2021.  Recommended f/u in 10 years.      Hypercholesterolemia -     CBC with Differential/Platelet -     Comprehensive metabolic panel -     TSH -     Lipid panel  Hyperglycemia  Screening for cervical cancer -     Cytology - PAP     D[place], MD

## 2022-07-07 NOTE — Assessment & Plan Note (Addendum)
Physical today 07/07/22.  Mammogram 06/24/21 - Birads I. Schedule f/u mammogram.  PAP 05/14/21 - negative with negative HPV. Had colonoscopy 2021.  Recommended f/u in 10 years.

## 2022-07-11 LAB — CYTOLOGY - PAP
Comment: NEGATIVE
Diagnosis: NEGATIVE
High risk HPV: NEGATIVE

## 2022-07-14 ENCOUNTER — Encounter: Payer: Self-pay | Admitting: Internal Medicine

## 2022-07-14 NOTE — Assessment & Plan Note (Signed)
Recent covid.  Doing better.  Feeling better.  Minimal residual cough.  Cough suppressant - delsym/robitussin DM - iff needed.  Follow.  Notify if persistent problems.

## 2022-07-14 NOTE — Assessment & Plan Note (Signed)
Has been seeing Dr Maryruth Bun and counselor.  Has f/u planned in July.  Planning new job.  Overall appears to be doing better.  Follow.

## 2022-07-14 NOTE — Assessment & Plan Note (Signed)
Low carb diet and exercise.  Follow met b and a1c.   

## 2022-07-14 NOTE — Assessment & Plan Note (Signed)
Low cholesterol diet and exercise.  Follow lipid panel.   

## 2022-08-05 ENCOUNTER — Encounter: Payer: Self-pay | Admitting: Internal Medicine

## 2022-08-08 NOTE — Telephone Encounter (Signed)
Please print form.  Also, need to know if she needs a TB skin test - annually.  Her last was 2022.

## 2022-08-08 NOTE — Telephone Encounter (Signed)
She had her cpe on 6/27. Please look at this form and let me know if we can use her 6/27 visit or if she needs to be seen again.

## 2022-08-09 NOTE — Telephone Encounter (Signed)
FYI. Does not need TB test. Ok to fill out form for her?

## 2022-08-09 NOTE — Telephone Encounter (Signed)
Signed form.  Per note, no PPD needed.  I do not have documented that she has received tetanus booster.  If has not had one, then can get booster at pharmacy.

## 2022-09-09 ENCOUNTER — Encounter: Payer: Self-pay | Admitting: Internal Medicine

## 2022-09-10 ENCOUNTER — Ambulatory Visit
Admission: EM | Admit: 2022-09-10 | Discharge: 2022-09-10 | Disposition: A | Discharge status: Home / Self Care | Payer: BC Managed Care – PPO | Attending: Family Medicine | Admitting: Family Medicine

## 2022-09-10 DIAGNOSIS — M25551 Pain in right hip: Secondary | ICD-10-CM | POA: Diagnosis not present

## 2022-09-10 DIAGNOSIS — J019 Acute sinusitis, unspecified: Secondary | ICD-10-CM | POA: Diagnosis not present

## 2022-09-10 MED ORDER — CEFDINIR 300 MG PO CAPS: 300.0000 mg | ORAL_CAPSULE | Freq: Two times a day (BID) | ORAL | 0 refills | Status: AC

## 2022-09-10 MED ORDER — NAPROXEN 375 MG PO TABS: 375.0000 mg | ORAL_TABLET | Freq: Two times a day (BID) | ORAL | 0 refills | Status: DC

## 2022-09-10 MED ORDER — FLUCONAZOLE 150 MG PO TABS: 150.0000 mg | ORAL_TABLET | ORAL | 0 refills | Status: DC | PRN

## 2022-09-10 MED ORDER — KETOROLAC TROMETHAMINE 30 MG/ML IJ SOLN
30.0000 mg | Freq: Once | INTRAMUSCULAR | Status: AC
  Administered 2022-09-10: 30 mg via INTRAMUSCULAR

## 2022-09-10 NOTE — Discharge Instructions (Addendum)
Wait 8 hours before taking Naprosyn if needed for hip pain as she received a Toradol injection. I have prescribed you cefdinir for your sinus infection instead of Augmentin, your allergy list and intolerance to Augmentin.  I have also prescribed you Diflucan which you can take 1 dose today repeat in 3 days if needed to prevent yeast associated with antibiotics.

## 2022-09-10 NOTE — ED Provider Notes (Signed)
Renaldo Fiddler    CSN: [id number] Arrival date & time: 09/10/22  1322      History   Chief Complaint Chief Complaint  Patient presents with   Nasal Congestion    HPI AHLIANA SHARIF is a 47 y.o. female.   HPI Patient here today for evaluation of over 2 weeks of sinus symptoms which are progressively worsening. She reports that sinus symptoms have now progressed into congestion in her chest.  She has noticed a persistent cough over the last few days.  She reports getting sinus infections annually typically resolve after treatment with antibiotics.  Patient takes antihistamine daily for allergy symptoms.  Denies any fever, body aches, shortness of breath or wheezing.  Past Medical History:  Diagnosis Date   C. difficile colitis    augment use   Depression    Dysmenorrhea    Environmental allergies    allergy shots   History of frequent urinary tract infections    Hypercholesterolemia    Sleep apnea    CPAP used at home    Patient Active Problem List   Diagnosis Date Noted   Right buttock pain 06/04/2022   Nasal congestion 11/11/2021   Near syncope 08/14/2021   Cough 08/14/2021   Superficial fungus infection of skin 08/14/2021   Hyperglycemia 03/05/2020   Fatigue 03/05/2020   Sleep apnea 11/25/2018   Anemia 11/23/2018   Abdominal pain 10/22/2017   Leiomyoma of body of uterus 09/12/2017   Positive ANA (antinuclear antibody) 05/10/2016   Joint ache 03/28/2016   Hypercholesterolemia 03/28/2016   Contusion of coccyx 09/25/2015   Pain in the coccyx 09/25/2015   Menstrual irregularity 05/10/2015   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   Sinusitis, acute 11/21/2014   Vaginal lesion 10/13/2014   Heavy period 08/05/2014   Stress 08/05/2014   Abnormal Pap smear of cervix 03/29/2014   Vaginal irritation 03/29/2014   Health care maintenance 03/29/2014   Insomnia 03/11/2014   Snoring 03/11/2014   Sinusitis 03/10/2014   UTI (urinary tract infection)  01/19/2014   GERD (gastroesophageal reflux disease) 11/17/2013   Vaginal bleeding 04/29/2013   Difficulty sleeping 02/17/2013   Left hip pain 03/20/2012   Environmental allergies 02/05/2012    Past Surgical History:  Procedure Laterality Date   COLONOSCOPY WITH PROPOFOL N/A 01/18/2019   Procedure: COLONOSCOPY WITH PROPOFOL;  Surgeon: Wyline Mood, MD;  Location: [facility] ENDOSCOPY;  Service: Gastroenterology;  Laterality: N/A;   NASAL SINUS SURGERY  2003   UTERINE FIBROID SURGERY  2019    OB History     Gravida  0   Para  0   Term  0   Preterm  0   AB  0   Living  0      SAB  0   IAB  0   Ectopic  0   Multiple  0   Live Births               Home Medications    Prior to Admission medications   Medication Sig Start Date End Date Taking? Authorizing Provider  cefdinir (OMNICEF) 300 MG capsule Take 1 capsule (300 mg total) by mouth 2 (two) times daily for 10 days. 09/10/22 09/20/22 Yes Bing Neighbors, NP  esomeprazole (NEXIUM) 40 MG capsule TAKE 1 CAPSULE BY MOUTH TWICE DAILY BEFORE MEALS 05/13/22  Yes Dale Socorro, MD  fluconazole (DIFLUCAN) 150 MG tablet Take 1 tablet (150 mg total) by mouth every three (3) days as needed. Repeat if  needed 09/10/22  Yes Bing Neighbors, NP  gentamicin ointment (GARAMYCIN) 0.1 % Apply topically 3 (three) times daily. 12/22/21  Yes [provider]  Loratadine 10 MG CAPS Take by mouth.   Yes [provider]  meloxicam (MOBIC) 15 MG tablet Take 1 tablet (15 mg total) by mouth daily. 04/27/22  Yes Felecia Shelling, DPM  naproxen (NAPROSYN) 375 MG tablet Take 1 tablet (375 mg total) by mouth 2 (two) times daily. 09/10/22  Yes Bing Neighbors, NP  pseudoephedrine (SUDAFED) 120 MG 12 hr tablet Take 120 mg by mouth daily.   Yes [provider]  sertraline (ZOLOFT) 100 MG tablet TAKE 1 TABLET(100 MG) BY MOUTH DAILY 01/13/20  Yes Dale Hamburg, MD  traZODone (DESYREL) 50 MG tablet Take 50-100 mg by mouth at  bedtime as needed. 04/21/20  Yes [provider]  azelastine (ASTELIN) 0.1 % nasal spray Place 2 sprays into both nostrils 2 (two) times daily. 05/21/22   [provider]  EPINEPHrine 0.3 mg/0.3 mL IJ SOAJ injection  07/06/15   [provider]  famotidine (PEPCID) 40 MG tablet Take 40 mg by mouth 2 (two) times daily as needed. 05/21/22   [provider]  valACYclovir (VALTREX) 500 MG tablet Take 1 tablet (500 mg total) by mouth daily. 02/04/22   Dale Shepherd, MD    Family History Family History  Problem Relation Age of Onset   Hyperlipidemia Mother    Asthma Father    Hyperlipidemia Father    Allergies Father    Breast cancer Neg Hx     Social History Social History   Tobacco Use   Smoking status: Never   Smokeless tobacco: Never  Substance Use Topics   Alcohol use: Yes    Alcohol/week: 0.0 standard drinks of alcohol    Comment: occasional   Drug use: No     Allergies   Augmentin [amoxicillin-pot clavulanate]   Review of Systems Review of Systems Pertinent negatives listed in HPI   Physical Exam Triage Vital Signs ED Triage Vitals [09/10/22 1412]  Encounter Vitals Group     BP 111/73     Systolic BP Percentile      Diastolic BP Percentile      Pulse Rate 76     Resp 18     Temp 98.2 F (36.8 C)     Temp Source Oral     SpO2 95 %     Weight      Height      Head Circumference      Peak Flow      Pain Score      Pain Loc      Pain Education      Exclude from Growth Chart    No data found.  Updated Vital Signs BP 111/73 (BP Location: Left Arm)   Pulse 76   Temp 98.2 F (36.8 C) (Oral)   Resp 18   SpO2 95%   Visual Acuity Right Eye Distance:   Left Eye Distance:   Bilateral Distance:    Right Eye Near:   Left Eye Near:    Bilateral Near:     Physical Exam Vitals reviewed.  Constitutional:      Appearance: Normal appearance.  HENT:     Head: Normocephalic and atraumatic.  Eyes:     Extraocular  Movements: Extraocular movements intact.     Pupils: Pupils are equal, round, and reactive to light.  Cardiovascular:     Rate and  Rhythm: Normal rate and regular rhythm.  Pulmonary:     Effort: Pulmonary effort is normal.     Breath sounds: Normal breath sounds.  Musculoskeletal:     Cervical back: Normal range of motion.  Skin:    General: Skin is warm and dry.  Neurological:     General: No focal deficit present.     Mental Status: She is alert.      UC Treatments / Results  Labs (all labs ordered are listed, but only abnormal results are displayed) Labs Reviewed - No data to display  EKG   Radiology No results found.  Procedures Procedures (including critical care time)  Medications Ordered in UC Medications  ketorolac (TORADOL) 30 MG/ML injection 30 mg (30 mg Intramuscular Given 09/10/22 1446)    Initial Impression / Assessment and Plan / UC Course  I have reviewed the triage vital signs and the nursing notes.  Pertinent labs & imaging results that were available during my care of the patient were reviewed by me and considered in my medical decision making (see chart for details).   Acute sinusitis treatment with cefdinir twice daily for 10 days.  Diflucan prescribed for prophylaxis against antibiotic induced yeast infection.  Patient is also having some acute recurrent right hip pain treated in clinic with Toradol 30 mg IM.  Patient can continue home management with anti-inflammatory if pain returns with naproxen 375 twice daily as needed.  She will follow-up with primary care provider as needed. Final Clinical Impressions(s) / UC Diagnoses   Final diagnoses:  Acute non-recurrent sinusitis, unspecified location  Right hip pain     Discharge Instructions      Wait 8 hours before taking Naprosyn if needed for hip pain as she received a Toradol injection. I have prescribed you cefdinir for your sinus infection instead of Augmentin, your allergy list and  intolerance to Augmentin.  I have also prescribed you Diflucan which you can take 1 dose today repeat in 3 days if needed to prevent yeast associated with antibiotics.   ED Prescriptions     Medication Sig Dispense Auth. Provider   cefdinir (OMNICEF) 300 MG capsule Take 1 capsule (300 mg total) by mouth 2 (two) times daily for 10 days. 20 capsule Bing Neighbors, NP   naproxen (NAPROSYN) 375 MG tablet Take 1 tablet (375 mg total) by mouth 2 (two) times daily. 20 tablet Bing Neighbors, NP   fluconazole (DIFLUCAN) 150 MG tablet Take 1 tablet (150 mg total) by mouth every three (3) days as needed. Repeat if needed 2 tablet Bing Neighbors, NP      PDMP not reviewed this encounter.   Bing Neighbors, NP 09/10/22 [phone]

## 2022-09-10 NOTE — ED Triage Notes (Signed)
Triage per provider

## 2022-10-05 ENCOUNTER — Encounter: Payer: Self-pay | Admitting: Internal Medicine

## 2022-10-05 DIAGNOSIS — M25559 Pain in unspecified hip: Secondary | ICD-10-CM | POA: Insufficient documentation

## 2022-10-20 DIAGNOSIS — M47816 Spondylosis without myelopathy or radiculopathy, lumbar region: Secondary | ICD-10-CM | POA: Insufficient documentation

## 2022-10-28 ENCOUNTER — Ambulatory Visit
Admission: RE | Admit: 2022-10-28 | Discharge: 2022-10-28 | Disposition: A | Discharge status: Home / Self Care | Payer: BC Managed Care – PPO | Attending: Internal Medicine | Admitting: Internal Medicine

## 2022-10-28 ENCOUNTER — Ambulatory Visit
Admission: RE | Admit: 2022-10-28 | Discharge: 2022-10-28 | Disposition: A | Discharge status: Home / Self Care | Payer: BC Managed Care – PPO | Source: Ambulatory Visit | Attending: Internal Medicine | Admitting: Internal Medicine

## 2022-10-28 ENCOUNTER — Encounter: Payer: Self-pay | Admitting: Internal Medicine

## 2022-10-28 ENCOUNTER — Ambulatory Visit: Payer: BC Managed Care – PPO | Admitting: Internal Medicine

## 2022-10-28 VITALS — BP 124/70 | HR 83 | Temp 98.0°F | Resp 16 | Ht 70.0 in | Wt 251.0 lb

## 2022-10-28 DIAGNOSIS — J329 Chronic sinusitis, unspecified: Secondary | ICD-10-CM

## 2022-10-28 DIAGNOSIS — R059 Cough, unspecified: Secondary | ICD-10-CM | POA: Insufficient documentation

## 2022-10-28 LAB — POCT URINE PREGNANCY: Preg Test, Ur: NEGATIVE

## 2022-10-28 MED ORDER — LEVOFLOXACIN 500 MG PO TABS: 500.0000 mg | ORAL_TABLET | Freq: Every day | ORAL | 0 refills | Status: AC

## 2022-10-28 MED ORDER — ALBUTEROL SULFATE HFA 108 (90 BASE) MCG/ACT IN AERS: 2.0000 | INHALATION_SPRAY | Freq: Four times a day (QID) | RESPIRATORY_TRACT | 0 refills | Status: DC | PRN

## 2022-10-28 MED ORDER — PREDNISONE 10 MG PO TABS: ORAL_TABLET | ORAL | 0 refills | Status: DC

## 2022-10-28 NOTE — Progress Notes (Unsigned)
Subjective:    Patient ID: Melissa Higgins, female    DOB: 07/25/1975, 47 y.o.   MRN: [id number]  Patient here for  Chief Complaint  Patient presents with   Sinus Problem    HPI Here for work in appt. Work in with concerns regarding persistent sinus infection/respiratory infection.  Was initially evaluated 09/10/22 - two weeks of symptoms - head congestion.  Treated with omnicef.  Did feel better, but symptoms never completely resolved.  Was reevaluated 09/26/22 - acute care.  Treated with doxycycline and prednisone.  Over the last two weeks, increased sinus pressure and congestion.  Ears feel full. No fever. Increased productive cough.  Has taken covid test - negative.  Using flonase, neti pot and saline gel.  Also taking mucinex.  Scheduled to see Dr Jenne Campus 11/03/22.  Denies possibility of being pregnant.  LMP last week.    Past Medical History:  Diagnosis Date   C. difficile colitis    augment use   Depression    Dysmenorrhea    Environmental allergies    allergy shots   History of frequent urinary tract infections    Hypercholesterolemia    Sleep apnea    CPAP used at home   Past Surgical History:  Procedure Laterality Date   COLONOSCOPY WITH PROPOFOL N/A 01/18/2019   Procedure: COLONOSCOPY WITH PROPOFOL;  Surgeon: Wyline Mood, MD;  Location: [facility] ENDOSCOPY;  Service: Gastroenterology;  Laterality: N/A;   NASAL SINUS SURGERY  2003   UTERINE FIBROID SURGERY  2019   Family History  Problem Relation Age of Onset   Hyperlipidemia Mother    Asthma Father    Hyperlipidemia Father    Allergies Father    Breast cancer Neg Hx    Social History   Socioeconomic History   Marital status: Divorced    Spouse name: Not on file   Number of children: 0   Years of education: college   Highest education level: Master's degree (e.g., MA, MS, MEng, MEd, MSW, MBA)  Occupational History   Not on file  Tobacco Use   Smoking status: Never   Smokeless tobacco: Never  Substance and  Sexual Activity   Alcohol use: Yes    Alcohol/week: 0.0 standard drinks of alcohol    Comment: occasional   Drug use: No   Sexual activity: Yes    Birth control/protection: None  Other Topics Concern   Not on file  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   Social Determinants of Health   Financial Resource Strain: Low Risk  (10/26/2022)   Overall Financial Resource Strain (CARDIA)    Difficulty of Paying Living Expenses: Not hard at all  Food Insecurity: No Food Insecurity (10/26/2022)   Hunger Vital Sign    Worried About Running Out of Food in the Last Year: Never true    Ran Out of Food in the Last Year: Never true  Transportation Needs: No Transportation Needs (10/26/2022)   PRAPARE - Administrator, Civil Service (Medical): No    Lack of Transportation (Non-Medical): No  Physical Activity: Insufficiently Active (10/26/2022)   Exercise Vital Sign    Days of Exercise per Week: 2 days    Minutes of Exercise per Session: 20 min  Stress: No Stress Concern Present (10/26/2022)   [employer] of Occupational Health - Occupational Stress Questionnaire    Feeling of Stress : Not at all  Social Connections: Socially Isolated (10/26/2022)  Social Connection and Isolation Panel [NHANES]    Frequency of Communication with Friends and Family: Three times a week    Frequency of Social Gatherings with Friends and Family: Once a week    Attends Religious Services: Never    Database administrator or Organizations: No    Attends Engineer, structural: Not on file    Marital Status: Divorced     Review of Systems  Constitutional:  Negative for appetite change and fever.  HENT:  Positive for congestion, postnasal drip and sinus pain.        Ear fullness.   Respiratory:  Positive for cough. Negative for chest tightness and shortness of breath.   Cardiovascular:  Negative for chest pain and palpitations.   Gastrointestinal:  Negative for abdominal pain, diarrhea, nausea and vomiting.  Genitourinary:  Negative for difficulty urinating and dysuria.  Musculoskeletal:  Negative for joint swelling and myalgias.  Neurological:  Negative for dizziness and headaches.  Psychiatric/Behavioral:  Negative for agitation and dysphoric mood.        Objective:     BP 124/70   Pulse 83   Temp 98 F (36.7 C)   Resp 16   Ht 5\' 10"  (1.778 m)   Wt 251 lb (113.9 kg)   SpO2 98%   BMI 36.01 kg/m  Wt Readings from Last 3 Encounters:  10/28/22 251 lb (113.9 kg)  07/07/22 242 lb (109.8 kg)  06/03/22 245 lb 9.6 oz (111.4 kg)    Physical Exam Vitals reviewed.  Constitutional:      General: She is not in acute distress.    Appearance: Normal appearance.  HENT:     Head: Normocephalic and atraumatic.     Right Ear: Tympanic membrane and external ear normal.     Left Ear: Tympanic membrane and external ear normal.     Mouth/Throat:     Pharynx: No oropharyngeal exudate or posterior oropharyngeal erythema.  Eyes:     General: No scleral icterus.       Right eye: No discharge.        Left eye: No discharge.     Conjunctiva/sclera: Conjunctivae normal.  Neck:     Thyroid: No thyromegaly.  Cardiovascular:     Rate and Rhythm: Normal rate and regular rhythm.  Pulmonary:     Effort: No respiratory distress.     Breath sounds: Normal breath sounds. No wheezing.  Abdominal:     General: Bowel sounds are normal.     Palpations: Abdomen is soft.     Tenderness: There is no abdominal tenderness.  Musculoskeletal:        General: No swelling or tenderness.     Cervical back: Neck supple. No tenderness.  Lymphadenopathy:     Cervical: No cervical adenopathy.  Skin:    Findings: No erythema or rash.  Neurological:     Mental Status: She is alert.  Psychiatric:        Mood and Affect: Mood normal.        Behavior: Behavior normal.      Outpatient Encounter Medications as of 10/28/2022   Medication Sig   albuterol (VENTOLIN HFA) 108 (90 Base) MCG/ACT inhaler Inhale 2 puffs into the lungs every 6 (six) hours as needed for wheezing or shortness of breath.   levofloxacin (LEVAQUIN) 500 MG tablet Take 1 tablet (500 mg total) by mouth daily for 7 days.   predniSONE (DELTASONE) 10 MG tablet Take 6 tablets x 1 day and then  decrease by 1/2 tablet per day until down to zero mg.   azelastine (ASTELIN) 0.1 % nasal spray Place 2 sprays into both nostrils 2 (two) times daily.   EPINEPHrine 0.3 mg/0.3 mL IJ SOAJ injection    esomeprazole (NEXIUM) 40 MG capsule TAKE 1 CAPSULE BY MOUTH TWICE DAILY BEFORE MEALS   famotidine (PEPCID) 40 MG tablet Take 40 mg by mouth 2 (two) times daily as needed.   gentamicin ointment (GARAMYCIN) 0.1 % Apply topically 3 (three) times daily.   Loratadine 10 MG CAPS Take by mouth.   meloxicam (MOBIC) 15 MG tablet Take 1 tablet (15 mg total) by mouth daily.   naproxen (NAPROSYN) 375 MG tablet Take 1 tablet (375 mg total) by mouth 2 (two) times daily.   pseudoephedrine (SUDAFED) 120 MG 12 hr tablet Take 120 mg by mouth daily.   sertraline (ZOLOFT) 100 MG tablet TAKE 1 TABLET(100 MG) BY MOUTH DAILY   traZODone (DESYREL) 50 MG tablet Take 50-100 mg by mouth at bedtime as needed.   valACYclovir (VALTREX) 500 MG tablet Take 1 tablet (500 mg total) by mouth daily.   [DISCONTINUED] fluconazole (DIFLUCAN) 150 MG tablet Take 1 tablet (150 mg total) by mouth every three (3) days as needed. Repeat if needed   Facility-Administered Encounter Medications as of 10/28/2022  Medication   betamethasone acetate-betamethasone sodium phosphate (CELESTONE) injection 3 mg   betamethasone acetate-betamethasone sodium phosphate (CELESTONE) injection 3 mg   betamethasone acetate-betamethasone sodium phosphate (CELESTONE) injection 3 mg     Lab Results  Component Value Date   WBC 7.6 07/07/2022   HGB 13.1 07/07/2022   HCT 40.1 07/07/2022   PLT 276.0 07/07/2022   GLUCOSE 92  07/07/2022   CHOL 207 (H) 07/07/2022   TRIG 112.0 07/07/2022   HDL 41.10 07/07/2022   LDLCALC 143 (H) 07/07/2022   ALT 10 07/07/2022   AST 15 07/07/2022   NA 137 07/07/2022   K 3.8 07/07/2022   CL 103 07/07/2022   CREATININE 0.78 07/07/2022   BUN 21 07/07/2022   CO2 27 07/07/2022   TSH 2.17 07/07/2022   HGBA1C 5.7 03/05/2020       Assessment & Plan:  Sinusitis, unspecified chronicity, unspecified location Assessment & Plan: Symptoms c/w sinusitis/URI.  Increased sinus pressure, nasal congestion and productive cough.  Persistent despite previous omnicef and doxycycline.  Treat with levquin and prednisone taper as directed. Continue saline nasal spray and steroid nasal spray.  Continue mucinex.  Treat with levaquin and prednisone taper as directed.  Albuterol prn.  Check cxr.  Follow.  Call with update.  Keep ENT appt.    Orders: -     POCT urine pregnancy  Cough, unspecified type Assessment & Plan: Symptoms appear to be c/w sinusitis, URI as outlined.  Check cxr.  Treat with levauqin and prednisone taper.  Continue nasal sprays and mucinex.  Albuterol inhaler prn.  Follow.  Call with update.   Orders: -     DG Chest 2 View; Future  Other orders -     levoFLOXacin; Take 1 tablet (500 mg total) by mouth daily for 7 days.  Dispense: 7 tablet; Refill: 0 -     Albuterol Sulfate HFA; Inhale 2 puffs into the lungs every 6 (six) hours as needed for wheezing or shortness of breath.  Dispense: 18 g; Refill: 0 -     predniSONE; Take 6 tablets x 1 day and then decrease by 1/2 tablet per day until down to zero mg.  Dispense: 39  tablet; Refill: 0     Dale Turton, MD

## 2022-10-29 ENCOUNTER — Encounter: Payer: Self-pay | Admitting: Internal Medicine

## 2022-10-29 NOTE — Assessment & Plan Note (Addendum)
Symptoms c/w sinusitis/URI.  Increased sinus pressure, nasal congestion and productive cough.  Persistent despite previous omnicef and doxycycline.  Treat with levquin and prednisone taper as directed. Continue saline nasal spray and steroid nasal spray.  Continue mucinex.  Treat with levaquin and prednisone taper as directed.  Albuterol prn.  Check cxr.  Follow.  Call with update.  Keep ENT appt.

## 2022-10-29 NOTE — Assessment & Plan Note (Signed)
Symptoms appear to be c/w sinusitis, URI as outlined.  Check cxr.  Treat with levauqin and prednisone taper.  Continue nasal sprays and mucinex.  Albuterol inhaler prn.  Follow.  Call with update.

## 2022-11-14 ENCOUNTER — Other Ambulatory Visit: Payer: Self-pay | Admitting: Podiatry

## 2022-11-23 NOTE — Telephone Encounter (Signed)
Error

## 2022-11-25 ENCOUNTER — Encounter: Payer: Self-pay | Admitting: Internal Medicine

## 2022-11-25 NOTE — Telephone Encounter (Signed)
If it is HPV wart, will need to see gyn.  I can place referral.

## 2022-11-25 NOTE — Telephone Encounter (Signed)
I can check her if she desires - can schedule an appt.  Can see if is something I can treat

## 2022-12-02 ENCOUNTER — Ambulatory Visit: Payer: BC Managed Care – PPO | Admitting: Internal Medicine

## 2022-12-02 VITALS — BP 112/70 | HR 79 | Temp 98.0°F | Resp 16 | Ht 70.0 in | Wt 257.0 lb

## 2022-12-02 DIAGNOSIS — E7800 Pure hypercholesterolemia, unspecified: Secondary | ICD-10-CM | POA: Diagnosis not present

## 2022-12-02 DIAGNOSIS — F439 Reaction to severe stress, unspecified: Secondary | ICD-10-CM | POA: Diagnosis not present

## 2022-12-02 DIAGNOSIS — R739 Hyperglycemia, unspecified: Secondary | ICD-10-CM | POA: Diagnosis not present

## 2022-12-02 DIAGNOSIS — N898 Other specified noninflammatory disorders of vagina: Secondary | ICD-10-CM

## 2022-12-02 MED ORDER — MUPIROCIN 2 % EX OINT: 1.0000 | TOPICAL_OINTMENT | Freq: Two times a day (BID) | CUTANEOUS | 0 refills | Status: DC

## 2022-12-02 NOTE — Progress Notes (Unsigned)
Subjective:    Patient ID: Melissa Higgins, female    DOB: September 17, 1975, 47 y.o.   MRN: [id number]  Patient here for work in appt.   HPI Work in appt - work in with concerns regarding a vaginal lesion. Reports first noticed two weeks ago. Pain. Lesion scabbed. Scab - pulled off.  Persistent. Is smaller now. Saw ENT - CT sinus - allergies.  Changed to zyrtec. Has been on abx.  No chest congestion. No urine change.  No intravaginal discharge or pain. Plans to f/u with [facility] - regarding her back issues. Handling stress.    Past Medical History:  Diagnosis Date   C. difficile colitis    augment use   Depression    Dysmenorrhea    Environmental allergies    allergy shots   History of frequent urinary tract infections    Hypercholesterolemia    Sleep apnea    CPAP used at home   Past Surgical History:  Procedure Laterality Date   COLONOSCOPY WITH PROPOFOL N/A 01/18/2019   Procedure: COLONOSCOPY WITH PROPOFOL;  Surgeon: Wyline Mood, MD;  Location: [facility] ENDOSCOPY;  Service: Gastroenterology;  Laterality: N/A;   NASAL SINUS SURGERY  2003   UTERINE FIBROID SURGERY  2019   Family History  Problem Relation Age of Onset   Hyperlipidemia Mother    Asthma Father    Hyperlipidemia Father    Allergies Father    Breast cancer Neg Hx    Social History   Socioeconomic History   Marital status: Divorced    Spouse name: Not on file   Number of children: 0   Years of education: college   Highest education level: Master's degree (e.g., MA, MS, MEng, MEd, MSW, MBA)  Occupational History   Not on file  Tobacco Use   Smoking status: Never   Smokeless tobacco: Never  Substance and Sexual Activity   Alcohol use: Yes    Alcohol/week: 0.0 standard drinks of alcohol    Comment: occasional   Drug use: No   Sexual activity: Yes    Birth control/protection: None  Other Topics Concern   Not on file  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    Social Determinants of Health   Financial Resource Strain: Low Risk  (10/26/2022)   Overall Financial Resource Strain (CARDIA)    Difficulty of Paying Living Expenses: Not hard at all  Food Insecurity: No Food Insecurity (10/26/2022)   Hunger Vital Sign    Worried About Running Out of Food in the Last Year: Never true    Ran Out of Food in the Last Year: Never true  Transportation Needs: No Transportation Needs (10/26/2022)   PRAPARE - Administrator, Civil Service (Medical): No    Lack of Transportation (Non-Medical): No  Physical Activity: Insufficiently Active (10/26/2022)   Exercise Vital Sign    Days of Exercise per Week: 2 days    Minutes of Exercise per Session: 20 min  Stress: No Stress Concern Present (10/26/2022)   [employer] of Occupational Health - Occupational Stress Questionnaire    Feeling of Stress : Not at all  Social Connections: Socially Isolated (10/26/2022)   Social Connection and Isolation Panel [NHANES]    Frequency of Communication with Friends and Family: Three times a week    Frequency of Social Gatherings with Friends and Family: Once a week    Attends Religious Services: Never    Active Member  of Clubs or Organizations: No    Attends Engineer, structural: Not on file    Marital Status: Divorced     Review of Systems  Constitutional:  Negative for appetite change, fever and unexpected weight change.  HENT:  Negative for sinus pressure.        No increased congestion currently.   Respiratory:  Negative for cough, chest tightness and shortness of breath.   Cardiovascular:  Negative for chest pain and palpitations.  Gastrointestinal:  Negative for abdominal pain, diarrhea, nausea and vomiting.  Genitourinary:  Negative for difficulty urinating and dysuria.       Vaginal lesion as outlined.   Musculoskeletal:  Negative for joint swelling and myalgias.  Skin:  Negative for color change and rash.  Neurological:  Negative  for dizziness and headaches.  Psychiatric/Behavioral:  Negative for agitation and dysphoric mood.        Objective:     BP 112/70   Pulse 79   Temp 98 F (36.7 C)   Resp 16   Ht 5\' 10"  (1.778 m)   Wt 257 lb (116.6 kg)   SpO2 97%   BMI 36.88 kg/m  Wt Readings from Last 3 Encounters:  12/02/22 257 lb (116.6 kg)  10/28/22 251 lb (113.9 kg)  07/07/22 242 lb (109.8 kg)    Physical Exam Vitals reviewed.  Constitutional:      General: She is not in acute distress.    Appearance: Normal appearance.  HENT:     Head: Normocephalic and atraumatic.     Right Ear: External ear normal.     Left Ear: External ear normal.  Eyes:     General: No scleral icterus.       Right eye: No discharge.        Left eye: No discharge.     Conjunctiva/sclera: Conjunctivae normal.  Neck:     Thyroid: No thyromegaly.  Cardiovascular:     Rate and Rhythm: Normal rate and regular rhythm.  Pulmonary:     Effort: No respiratory distress.     Breath sounds: Normal breath sounds. No wheezing.  Abdominal:     General: Bowel sounds are normal.     Palpations: Abdomen is soft.     Tenderness: There is no abdominal tenderness.  Genitourinary:    Comments: Vaginal lesion - suprapubic area. Culture obtained. Musculoskeletal:        General: No swelling or tenderness.     Cervical back: Neck supple. No tenderness.  Lymphadenopathy:     Cervical: No cervical adenopathy.  Skin:    Findings: No erythema or rash.  Neurological:     Mental Status: She is alert.  Psychiatric:        Mood and Affect: Mood normal.        Behavior: Behavior normal.      Outpatient Encounter Medications as of 12/02/2022  Medication Sig   mupirocin ointment (BACTROBAN) 2 % Apply 1 Application topically 2 (two) times daily.   albuterol (VENTOLIN HFA) 108 (90 Base) MCG/ACT inhaler Inhale 2 puffs into the lungs every 6 (six) hours as needed for wheezing or shortness of breath.   azelastine (ASTELIN) 0.1 % nasal spray  Place 2 sprays into both nostrils 2 (two) times daily.   EPINEPHrine 0.3 mg/0.3 mL IJ SOAJ injection    esomeprazole (NEXIUM) 40 MG capsule TAKE 1 CAPSULE BY MOUTH TWICE DAILY BEFORE MEALS   gentamicin ointment (GARAMYCIN) 0.1 % Apply topically 3 (three) times daily.  Loratadine 10 MG CAPS Take by mouth.   meloxicam (MOBIC) 15 MG tablet TAKE 1 TABLET(15 MG) BY MOUTH DAILY   pseudoephedrine (SUDAFED) 120 MG 12 hr tablet Take 120 mg by mouth daily.   sertraline (ZOLOFT) 100 MG tablet TAKE 1 TABLET(100 MG) BY MOUTH DAILY   traZODone (DESYREL) 50 MG tablet Take 50-100 mg by mouth at bedtime as needed.   valACYclovir (VALTREX) 500 MG tablet Take 1 tablet (500 mg total) by mouth daily.   [DISCONTINUED] famotidine (PEPCID) 40 MG tablet Take 40 mg by mouth 2 (two) times daily as needed.   [DISCONTINUED] naproxen (NAPROSYN) 375 MG tablet Take 1 tablet (375 mg total) by mouth 2 (two) times daily.   [DISCONTINUED] predniSONE (DELTASONE) 10 MG tablet Take 6 tablets x 1 day and then decrease by 1/2 tablet per day until down to zero mg.   Facility-Administered Encounter Medications as of 12/02/2022  Medication   betamethasone acetate-betamethasone sodium phosphate (CELESTONE) injection 3 mg   betamethasone acetate-betamethasone sodium phosphate (CELESTONE) injection 3 mg   betamethasone acetate-betamethasone sodium phosphate (CELESTONE) injection 3 mg     Lab Results  Component Value Date   WBC 7.6 07/07/2022   HGB 13.1 07/07/2022   HCT 40.1 07/07/2022   PLT 276.0 07/07/2022   GLUCOSE 92 07/07/2022   CHOL 207 (H) 07/07/2022   TRIG 112.0 07/07/2022   HDL 41.10 07/07/2022   LDLCALC 143 (H) 07/07/2022   ALT 10 07/07/2022   AST 15 07/07/2022   NA 137 07/07/2022   K 3.8 07/07/2022   CL 103 07/07/2022   CREATININE 0.78 07/07/2022   BUN 21 07/07/2022   CO2 27 07/07/2022   TSH 2.17 07/07/2022   HGBA1C 5.7 03/05/2020    DG Chest 2 View  Result Date: 10/28/2022 CLINICAL DATA:  persistent  congestion.  cough EXAM: CHEST - 2 VIEW COMPARISON:  None Available. FINDINGS: Cardiac silhouette is unremarkable. No pneumothorax or pleural effusion. The lungs are clear. The visualized skeletal structures are unremarkable. IMPRESSION: No acute cardiopulmonary process. Electronically Signed   By: Layla Maw M.D.   On: 10/28/2022 22:03       Assessment & Plan:  Vaginal lesion Assessment & Plan: Vaginal lesion as outlined.  History of HSV II.  Lesion improved.  Topical bactroban. Culture obtained.   Orders: -     Herpes simplex virus culture  Stress Assessment & Plan:  Has been seeing Dr Maryruth Bun and counselor.  Has f/u planned in July.  New job.  Overall appears to be doing better.  Follow.    Hyperglycemia Assessment & Plan: Low carb diet and exercise.  Follow met b and a1c.    Hypercholesterolemia Assessment & Plan: Low cholesterol diet and exercise.  Follow lipid panel.    Other orders -     Mupirocin; Apply 1 Application topically 2 (two) times daily.  Dispense: 22 g; Refill: 0     [place], MD

## 2022-12-04 ENCOUNTER — Encounter: Payer: Self-pay | Admitting: Internal Medicine

## 2022-12-04 NOTE — Assessment & Plan Note (Signed)
Low cholesterol diet and exercise.  Follow lipid panel.   

## 2022-12-04 NOTE — Assessment & Plan Note (Signed)
Vaginal lesion as outlined.  History of HSV II.  Lesion improved.  Topical bactroban. Culture obtained.

## 2022-12-04 NOTE — Assessment & Plan Note (Signed)
Has been seeing Dr Maryruth Bun and counselor.  Has f/u planned in July.  New job.  Overall appears to be doing better.  Follow.

## 2022-12-04 NOTE — Assessment & Plan Note (Signed)
Low carb diet and exercise.  Follow met b and a1c.   

## 2022-12-06 LAB — HERPES SIMPLEX VIRUS CULTURE
MICRO NUMBER:: 15768688
SPECIMEN QUALITY:: ADEQUATE

## 2022-12-26 ENCOUNTER — Encounter: Payer: Self-pay | Admitting: Internal Medicine

## 2022-12-26 DIAGNOSIS — M5416 Radiculopathy, lumbar region: Secondary | ICD-10-CM | POA: Insufficient documentation

## 2023-01-11 NOTE — Progress Notes (Signed)
 Subjective:    Patient ID: Melissa Higgins, female    DOB: 10-16-1975, 48 y.o.   MRN: [id number]  Patient here for  Chief Complaint  Patient presents with   Medical Management of Chronic Issues    HPI Here for a scheduled follow up.  Being followed by ortho for back pain.  S/p PT. S/p MRI - facet arthropathy at L3-4 on the right and bilaterally at L5-S1. No lumbar compression deformities or high-grade stenosis noted on MRI report. Presented with right-sided clinical L5 radiculopathy and facet arthrosis L5-S1 bilaterally. Plan: proceed with right-sided L5-S1 transforaminal injection. S/p injection 12/29/22 - helped. Has f/u tomorrow. Followed by psychiatry and counselor. Overall appears to be handling things relatively well. Reports some increased nasal congestion. Discussed using flonase  and astelin. Discussed concern regarding weight gain. Discussed diet and exercise. Discussed treatment options. Has history of sleep apnea. Discussed zepbound . Suprapubic lesion.     Past Medical History:  Diagnosis Date   C. difficile colitis    augment use   Depression    Dysmenorrhea    Environmental allergies    allergy shots   History of frequent urinary tract infections    Hypercholesterolemia    Sleep apnea    CPAP used at home   Past Surgical History:  Procedure Laterality Date   COLONOSCOPY WITH PROPOFOL  N/A 01/18/2019   Procedure: COLONOSCOPY WITH PROPOFOL ;  Surgeon: Therisa Bi, MD;  Location: [facility] ENDOSCOPY;  Service: Gastroenterology;  Laterality: N/A;   NASAL SINUS SURGERY  2003   UTERINE FIBROID SURGERY  2019   Family History  Problem Relation Age of Onset   Hyperlipidemia Mother    Asthma Father    Hyperlipidemia Father    Allergies Father    Breast cancer Neg Hx    Social History   Socioeconomic History   Marital status: Divorced    Spouse name: Not on file   Number of children: 0   Years of education: college   Highest education level: Master's degree (e.g., MA,  MS, MEng, MEd, MSW, MBA)  Occupational History   Not on file  Tobacco Use   Smoking status: Never   Smokeless tobacco: Never  Substance and Sexual Activity   Alcohol use: Yes    Alcohol/week: 0.0 standard drinks of alcohol    Comment: occasional   Drug use: No   Sexual activity: Yes    Birth control/protection: None  Other Topics Concern   Not on file  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   Social Drivers of Corporate Investment Banker Strain: Low Risk  (01/08/2023)   Overall Financial Resource Strain (CARDIA)    Difficulty of Paying Living Expenses: Not hard at all  Food Insecurity: No Food Insecurity (01/08/2023)   Hunger Vital Sign    Worried About Running Out of Food in the Last Year: Never true    Ran Out of Food in the Last Year: Never true  Transportation Needs: No Transportation Needs (01/08/2023)   PRAPARE - Administrator, Civil Service (Medical): No    Lack of Transportation (Non-Medical): No  Physical Activity: Inactive (01/08/2023)   Exercise Vital Sign    Days of Exercise per Week: 0 days    Minutes of Exercise per Session: 20 min  Stress: No Stress Concern Present (01/08/2023)   [employer] of Occupational Health - Occupational Stress Questionnaire    Feeling of Stress : Only a little  Social Connections: Socially Isolated (01/08/2023)   Social Connection and Isolation Panel [NHANES]    Frequency of Communication with Friends and Family: More than three times a week    Frequency of Social Gatherings with Friends and Family: Once a week    Attends Religious Services: Never    Database Administrator or Organizations: No    Attends Engineer, Structural: Not on file    Marital Status: Divorced     Review of Systems  Constitutional:  Negative for appetite change and unexpected weight change.  HENT:  Positive for congestion. Negative for sinus pressure.   Respiratory:  Negative for  cough, chest tightness and shortness of breath.   Cardiovascular:  Negative for chest pain and palpitations.  Gastrointestinal:  Negative for abdominal pain, diarrhea, nausea and vomiting.  Genitourinary:  Negative for difficulty urinating and dysuria.  Musculoskeletal:  Negative for joint swelling and myalgias.  Skin:  Negative for color change and rash.  Neurological:  Negative for dizziness and headaches.  Psychiatric/Behavioral:  Negative for agitation and dysphoric mood.        Objective:     BP 118/68   Pulse 86   Temp 98 F (36.7 C)   Resp 16   Ht 5' 10 (1.778 m)   Wt 257 lb (116.6 kg)   SpO2 98%   BMI 36.88 kg/m  Wt Readings from Last 3 Encounters:  01/12/23 257 lb (116.6 kg)  12/02/22 257 lb (116.6 kg)  10/28/22 251 lb (113.9 kg)    Physical Exam Vitals reviewed.  Constitutional:      General: She is not in acute distress.    Appearance: Normal appearance.  HENT:     Head: Normocephalic and atraumatic.     Right Ear: External ear normal.     Left Ear: External ear normal.  Eyes:     General: No scleral icterus.       Right eye: No discharge.        Left eye: No discharge.     Conjunctiva/sclera: Conjunctivae normal.  Neck:     Thyroid : No thyromegaly.  Cardiovascular:     Rate and Rhythm: Normal rate and regular rhythm.  Pulmonary:     Effort: No respiratory distress.     Breath sounds: Normal breath sounds. No wheezing.  Abdominal:     General: Bowel sounds are normal.     Palpations: Abdomen is soft.     Tenderness: There is no abdominal tenderness.  Musculoskeletal:        General: No swelling or tenderness.     Cervical back: Neck supple. No tenderness.  Lymphadenopathy:     Cervical: No cervical adenopathy.  Skin:    Findings: No erythema or rash.     Comments: Lesion - suprapubic lesion involving hair follicle.   Neurological:     Mental Status: She is alert.  Psychiatric:        Mood and Affect: Mood normal.        Behavior: Behavior  normal.      Outpatient Encounter Medications as of 01/12/2023  Medication Sig   tirzepatide  (ZEPBOUND ) 2.5 MG/0.5ML injection vial Inject 2.5 mg into the skin once a week.   albuterol  (VENTOLIN  HFA) 108 (90 Base) MCG/ACT inhaler Inhale 2 puffs into the lungs every 6 (six) hours as needed for wheezing or shortness of breath.   azelastine (ASTELIN) 0.1 % nasal spray Place 2 sprays into both nostrils 2 (two) times daily.   EPINEPHrine  0.3 mg/0.3 mL IJ SOAJ injection    esomeprazole  (NEXIUM ) 40 MG capsule TAKE 1 CAPSULE BY MOUTH TWICE DAILY BEFORE MEALS   gentamicin ointment (GARAMYCIN) 0.1 % Apply topically 3 (three) times daily.   Loratadine 10 MG CAPS Take by mouth.   meloxicam  (MOBIC ) 15 MG tablet TAKE 1 TABLET(15 MG) BY MOUTH DAILY   mupirocin  ointment (BACTROBAN ) 2 % Apply 1 Application topically 2 (two) times daily.   pseudoephedrine (SUDAFED) 120 MG 12 hr tablet Take 120 mg by mouth daily.   sertraline  (ZOLOFT ) 100 MG tablet TAKE 1 TABLET(100 MG) BY MOUTH DAILY   traZODone  (DESYREL ) 50 MG tablet Take 50-100 mg by mouth at bedtime as needed.   valACYclovir  (VALTREX ) 500 MG tablet Take 1 tablet (500 mg total) by mouth daily.   Facility-Administered Encounter Medications as of 01/12/2023  Medication   betamethasone  acetate-betamethasone  sodium phosphate (CELESTONE ) injection 3 mg   betamethasone  acetate-betamethasone  sodium phosphate (CELESTONE ) injection 3 mg   betamethasone  acetate-betamethasone  sodium phosphate (CELESTONE ) injection 3 mg     Lab Results  Component Value Date   WBC 7.6 07/07/2022   HGB 13.1 07/07/2022   HCT 40.1 07/07/2022   PLT 276.0 07/07/2022   GLUCOSE 92 07/07/2022   CHOL 207 (H) 07/07/2022   TRIG 112.0 07/07/2022   HDL 41.10 07/07/2022   LDLCALC 143 (H) 07/07/2022   ALT 10 07/07/2022   AST 15 07/07/2022   NA 137 07/07/2022   K 3.8 07/07/2022   CL 103 07/07/2022   CREATININE 0.78 07/07/2022   BUN 21 07/07/2022   CO2 27 07/07/2022   TSH 2.17  07/07/2022   HGBA1C 5.7 03/05/2020    DG Chest 2 View Result Date: 10/28/2022 CLINICAL DATA:  persistent congestion.  cough EXAM: CHEST - 2 VIEW COMPARISON:  None Available. FINDINGS: Cardiac silhouette is unremarkable. No pneumothorax or pleural effusion. The lungs are clear. The visualized skeletal structures are unremarkable. IMPRESSION: No acute cardiopulmonary process. Electronically Signed   By: Fonda Field M.D.   On: 10/28/2022 22:03       Assessment & Plan:  Hypercholesterolemia Assessment & Plan: Low cholesterol diet and exercise.  Follow lipid panel.   Orders: -     Lipid panel; Future -     Comprehensive metabolic panel; Future  Sleep apnea, unspecified type Assessment & Plan: Has known sleep apnea. Discussed weight loss.  Trial of zepbound . Discussed contraindications and possible side effects of zepbound .  Follow.   Orders: -     Tirzepatide -Weight Management; Inject 2.5 mg into the skin once a week.  Dispense: 2 mL; Refill: 3  Vaginal lesion Assessment & Plan: Vaginal /suprapubic lesion as outlined. Topical bactroban . Warm compresses.  Follow. Notify me if persistent.    Stress Assessment & Plan:  Has been seeing Dr Chipper and counselor. Overall appears to be handling things relatively well.  Follow.    Nasal congestion Assessment & Plan: Nasal congestion as outlined. Flonase  nasal spray/astelin nasal spray. Follow.  Call with update.    Lumbar radiculopathy Assessment & Plan: S/p ESI. Helped.  Follow.    Hyperglycemia Assessment & Plan: Low carb diet and exercise.  Follow met b and a1c.    Gastroesophageal reflux disease, unspecified whether esophagitis present Assessment & Plan: Continue nexium  and pepcid.       Allena Hamilton, MD

## 2023-01-12 ENCOUNTER — Ambulatory Visit (INDEPENDENT_AMBULATORY_CARE_PROVIDER_SITE_OTHER): Payer: 59 | Admitting: Internal Medicine

## 2023-01-12 VITALS — BP 118/68 | HR 86 | Temp 98.0°F | Resp 16 | Ht 70.0 in | Wt 257.0 lb

## 2023-01-12 DIAGNOSIS — K219 Gastro-esophageal reflux disease without esophagitis: Secondary | ICD-10-CM

## 2023-01-12 DIAGNOSIS — R739 Hyperglycemia, unspecified: Secondary | ICD-10-CM

## 2023-01-12 DIAGNOSIS — G4730 Sleep apnea, unspecified: Secondary | ICD-10-CM

## 2023-01-12 DIAGNOSIS — N898 Other specified noninflammatory disorders of vagina: Secondary | ICD-10-CM

## 2023-01-12 DIAGNOSIS — E7800 Pure hypercholesterolemia, unspecified: Secondary | ICD-10-CM | POA: Diagnosis not present

## 2023-01-12 DIAGNOSIS — F439 Reaction to severe stress, unspecified: Secondary | ICD-10-CM | POA: Diagnosis not present

## 2023-01-12 DIAGNOSIS — M5416 Radiculopathy, lumbar region: Secondary | ICD-10-CM

## 2023-01-12 DIAGNOSIS — R0981 Nasal congestion: Secondary | ICD-10-CM

## 2023-01-12 MED ORDER — TIRZEPATIDE-WEIGHT MANAGEMENT 2.5 MG/0.5ML ~~LOC~~ SOLN: 2.5000 mg | SUBCUTANEOUS | 3 refills | Status: DC

## 2023-01-13 ENCOUNTER — Encounter: Payer: Self-pay | Admitting: Internal Medicine

## 2023-01-13 NOTE — Telephone Encounter (Signed)
 Does this need a PA or is this patients cost after insurance?

## 2023-01-15 ENCOUNTER — Encounter: Payer: Self-pay | Admitting: Internal Medicine

## 2023-01-15 NOTE — Assessment & Plan Note (Signed)
Continue nexium and pepcid.

## 2023-01-15 NOTE — Assessment & Plan Note (Signed)
 Low cholesterol diet and exercise.  Follow lipid panel.

## 2023-01-15 NOTE — Assessment & Plan Note (Signed)
 Has been seeing Dr Maryruth Bun and counselor. Overall appears to be handling things relatively well.  Follow.

## 2023-01-15 NOTE — Assessment & Plan Note (Signed)
 Vaginal /suprapubic lesion as outlined. Topical bactroban. Warm compresses.  Follow. Notify me if persistent.

## 2023-01-15 NOTE — Assessment & Plan Note (Signed)
 Low carb diet and exercise.  Follow met b and a1c.

## 2023-01-15 NOTE — Assessment & Plan Note (Signed)
 S/p ESI. Helped.  Follow.

## 2023-01-15 NOTE — Assessment & Plan Note (Signed)
 Nasal congestion as outlined. Flonase nasal spray/astelin nasal spray. Follow.  Call with update.

## 2023-01-15 NOTE — Assessment & Plan Note (Signed)
 Has known sleep apnea. Discussed weight loss.  Trial of zepbound. Discussed contraindications and possible side effects of zepbound.  Follow.

## 2023-01-16 ENCOUNTER — Other Ambulatory Visit (HOSPITAL_COMMUNITY): Payer: Self-pay

## 2023-01-16 ENCOUNTER — Telehealth: Payer: Self-pay

## 2023-01-16 NOTE — Telephone Encounter (Signed)
 Unfortunately, it looks like the claim was run successfully through the pt's insurance, indicating that insurance expects her to pay full price, not sure if this is due to a deductible or not as I am receiving a "refill too soon" rejection.

## 2023-01-16 NOTE — Telephone Encounter (Signed)
 Pharmacy Patient Advocate Encounter   Received notification from Patient Advice Request messages that prior authorization for Zepbound  2.5mg /dose is required/requested.   Insurance verification completed.   The patient is insured through [facility] [employer] .   Per test claim: Refill too soon. PA is not needed at this time. Medication was filled 01/13/2023. Next eligible fill date is 02/03/2023.

## 2023-01-22 ENCOUNTER — Telehealth: Payer: 59 | Admitting: Family

## 2023-01-22 DIAGNOSIS — J019 Acute sinusitis, unspecified: Secondary | ICD-10-CM

## 2023-01-22 MED ORDER — DOXYCYCLINE HYCLATE 100 MG PO TABS: 100.0000 mg | ORAL_TABLET | Freq: Two times a day (BID) | ORAL | 0 refills | Status: DC

## 2023-01-22 NOTE — Progress Notes (Signed)
 Virtual Visit Consent   Melissa Higgins, you are scheduled for a virtual visit with a [facility] provider today. Just as with appointments in the office, your consent must be obtained to participate. Your consent will be active for this visit and any virtual visit you may have with one of our providers in the next 365 days. If you have a MyChart account, a copy of this consent can be sent to you electronically.  As this is a virtual visit, video technology does not allow for your provider to perform a traditional examination. This may limit your provider's ability to fully assess your condition. If your provider identifies any concerns that need to be evaluated in person or the need to arrange testing (such as labs, EKG, etc.), we will make arrangements to do so. Although advances in technology are sophisticated, we cannot ensure that it will always work on either your end or our end. If the connection with a video visit is poor, the visit may have to be switched to a telephone visit. With either a video or telephone visit, we are not always able to ensure that we have a secure connection.  By engaging in this virtual visit, you consent to the provision of healthcare and authorize for your insurance to be billed (if applicable) for the services provided during this visit. Depending on your insurance coverage, you may receive a charge related to this service.  I need to obtain your verbal consent now. Are you willing to proceed with your visit today? Melissa Higgins has provided verbal consent on 01/22/2023 for a virtual visit (video or telephone). Bari Learn, FNP  Date: 01/22/2023 9:29 AM  Virtual Visit via Video Note   I, Bari Learn, connected with  Melissa Higgins  ([id number], 49-07-1975) on 01/22/23 at  9:30 AM EST by a video-enabled telemedicine application and verified that I am speaking with the correct person using two identifiers.  Location: Patient: Virtual Visit Location  Patient: Home Provider: Virtual Visit Location Provider: Home Office   I discussed the limitations of evaluation and management by telemedicine and the availability of in person appointments. The patient expressed understanding and agreed to proceed.    History of Present Illness: Melissa Higgins is a 48 y.o. who identifies as a female who was assigned female at birth, and is being seen today for sinus congestion that started over a week ago. Reports she had a head cold last week, but started feeling better. However, yesterday started having headache and facial pressure that has worsen.   HPI: Sinusitis This is a new problem. The current episode started 1 to 4 weeks ago. The problem has been gradually worsening since onset. There has been no fever. Her pain is at a severity of 8/10. Associated symptoms include congestion, coughing, ear pain, headaches, a hoarse voice, sinus pressure, sneezing and a sore throat. Pertinent negatives include no shortness of breath. The treatment provided mild relief.    Problems:  Patient Active Problem List   Diagnosis Date Noted   Lumbar radiculopathy 12/26/2022   Hip pain 10/05/2022   Right buttock pain 06/04/2022   Nasal congestion 11/11/2021   Near syncope 08/14/2021   Cough 08/14/2021   Superficial fungus infection of skin 08/14/2021   Hyperglycemia 03/05/2020   Fatigue 03/05/2020   Sleep apnea 11/25/2018   Anemia 11/23/2018   Abdominal pain 10/22/2017   Leiomyoma of body of uterus 09/12/2017   Positive ANA (antinuclear antibody) 05/10/2016   Joint ache 03/28/2016  Hypercholesterolemia 03/28/2016   Contusion of coccyx 09/25/2015   Pain in the coccyx 09/25/2015   Menstrual irregularity 05/10/2015   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   Sinusitis, acute 11/21/2014   Vaginal lesion 10/13/2014   Heavy period 08/05/2014   Stress 08/05/2014   Abnormal Pap smear of cervix 03/29/2014   Vaginal irritation 03/29/2014   Health care  maintenance 03/29/2014   Insomnia 03/11/2014   Snoring 03/11/2014   Sinusitis 03/10/2014   UTI (urinary tract infection) 01/19/2014   GERD (gastroesophageal reflux disease) 11/17/2013   Vaginal bleeding 04/29/2013   Difficulty sleeping 02/17/2013   Left hip pain 03/20/2012   Environmental allergies 02/05/2012    Allergies:  Allergies  Allergen Reactions   Augmentin [Amoxicillin-Pot Clavulanate] Diarrhea   Medications:  Current Outpatient Medications:    doxycycline  (VIBRA -TABS) 100 MG tablet, Take 1 tablet (100 mg total) by mouth 2 (two) times daily., Disp: 20 tablet, Rfl: 0   albuterol  (VENTOLIN  HFA) 108 (90 Base) MCG/ACT inhaler, Inhale 2 puffs into the lungs every 6 (six) hours as needed for wheezing or shortness of breath., Disp: 18 g, Rfl: 0   azelastine (ASTELIN) 0.1 % nasal spray, Place 2 sprays into both nostrils 2 (two) times daily., Disp: , Rfl:    EPINEPHrine 0.3 mg/0.3 mL IJ SOAJ injection, , Disp: , Rfl:    esomeprazole  (NEXIUM ) 40 MG capsule, TAKE 1 CAPSULE BY MOUTH TWICE DAILY BEFORE MEALS, Disp: 180 capsule, Rfl: 1   gentamicin ointment (GARAMYCIN) 0.1 %, Apply topically 3 (three) times daily., Disp: , Rfl:    Loratadine 10 MG CAPS, Take by mouth., Disp: , Rfl:    meloxicam  (MOBIC ) 15 MG tablet, TAKE 1 TABLET(15 MG) BY MOUTH DAILY, Disp: 90 tablet, Rfl: 1   mupirocin  ointment (BACTROBAN ) 2 %, Apply 1 Application topically 2 (two) times daily., Disp: 22 g, Rfl: 0   pseudoephedrine (SUDAFED) 120 MG 12 hr tablet, Take 120 mg by mouth daily., Disp: , Rfl:    sertraline  (ZOLOFT ) 100 MG tablet, TAKE 1 TABLET(100 MG) BY MOUTH DAILY, Disp: 90 tablet, Rfl: 1   tirzepatide  (ZEPBOUND ) 2.5 MG/0.5ML injection vial, Inject 2.5 mg into the skin once a week., Disp: 2 mL, Rfl: 3   traZODone  (DESYREL ) 50 MG tablet, Take 50-100 mg by mouth at bedtime as needed., Disp: , Rfl:    valACYclovir  (VALTREX ) 500 MG tablet, Take 1 tablet (500 mg total) by mouth daily., Disp: 30 tablet, Rfl:  2  Current Facility-Administered Medications:    betamethasone  acetate-betamethasone  sodium phosphate (CELESTONE ) injection 3 mg, 3 mg, Intramuscular, Once, Evans, Brent M, DPM   betamethasone  acetate-betamethasone  sodium phosphate (CELESTONE ) injection 3 mg, 3 mg, Intramuscular, Once, Evans, Brent M, DPM   betamethasone  acetate-betamethasone  sodium phosphate (CELESTONE ) injection 3 mg, 3 mg, Intra-articular, Once, Janit Thresa HERO, DPM  Observations/Objective: Patient is well-developed, well-nourished in no acute distress.  Resting comfortably  at home.  Head is normocephalic, atraumatic.  No labored breathing.  Speech is clear and coherent with logical content.  Patient is alert and oriented at baseline.    Assessment and Plan: 1. Acute non-recurrent sinusitis, unspecified location (Primary) - doxycycline  (VIBRA -TABS) 100 MG tablet; Take 1 tablet (100 mg total) by mouth 2 (two) times daily.  Dispense: 20 tablet; Refill: 0  - Take meds as prescribed - Use a cool mist humidifier  -Use saline nose sprays frequently -Force fluids -For any cough or congestion  Use plain Mucinex- regular strength or max strength is fine -For fever or aces  or pains- take tylenol or ibuprofen . -Throat lozenges if help -Follow up if symptoms worsen or do not improve   Follow Up Instructions: I discussed the assessment and treatment plan with the patient. The patient was provided an opportunity to ask questions and all were answered. The patient agreed with the plan and demonstrated an understanding of the instructions.  A copy of instructions were sent to the patient via MyChart unless otherwise noted below.     The patient was advised to call back or seek an in-person evaluation if the symptoms worsen or if the condition fails to improve as anticipated.    Bari Learn, FNP

## 2023-01-24 ENCOUNTER — Ambulatory Visit
Admission: RE | Admit: 2023-01-24 | Discharge: 2023-01-24 | Disposition: A | Discharge status: Home / Self Care | Payer: 59 | Source: Ambulatory Visit | Attending: Family | Admitting: Family

## 2023-01-24 ENCOUNTER — Encounter: Payer: Self-pay | Admitting: Internal Medicine

## 2023-01-24 ENCOUNTER — Ambulatory Visit: Payer: Self-pay | Admitting: Internal Medicine

## 2023-01-24 ENCOUNTER — Ambulatory Visit: Payer: 59 | Admitting: Family

## 2023-01-24 ENCOUNTER — Encounter: Payer: Self-pay | Admitting: Family

## 2023-01-24 VITALS — BP 124/88 | HR 77 | Temp 98.3°F | Ht 70.0 in | Wt 252.4 lb

## 2023-01-24 DIAGNOSIS — R519 Headache, unspecified: Secondary | ICD-10-CM | POA: Insufficient documentation

## 2023-01-24 LAB — POC COVID19 BINAXNOW: SARS Coronavirus 2 Ag: NEGATIVE

## 2023-01-24 MED ORDER — GADOBUTROL 1 MMOL/ML IV SOLN
10.0000 mL | Freq: Once | INTRAVENOUS | Status: AC | PRN
  Administered 2023-01-24: 10 mL via INTRAVENOUS

## 2023-01-24 MED ORDER — ONDANSETRON 4 MG PO TBDP: 4.0000 mg | ORAL_TABLET | Freq: Three times a day (TID) | ORAL | 0 refills | Status: DC | PRN

## 2023-01-24 MED ORDER — KETOROLAC TROMETHAMINE 60 MG/2ML IM SOLN
60.0000 mg | Freq: Once | INTRAMUSCULAR | Status: AC
  Administered 2023-01-24: 60 mg via INTRAMUSCULAR

## 2023-01-24 NOTE — Addendum Note (Signed)
 Addended by: [place], Eliel Dudding on: 01/24/2023 11:52 AM   Modules accepted: Orders

## 2023-01-24 NOTE — Telephone Encounter (Signed)
 Pt already evaluated,

## 2023-01-24 NOTE — Addendum Note (Signed)
 Addended by: [place], Nandi Tonnesen on: 01/24/2023 01:50 PM   Modules accepted: Orders

## 2023-01-24 NOTE — Patient Instructions (Addendum)
 Follow up with pulmonology as untreated sleep apnea can cause headaches; please follow up with pulmonology  Please complete doxycycline  only await results of MRI brain.  I am not sure if sinus disease may not be contributing.  I provided you with Zofran  to use for nausea.  MRI brain as scheduled.  If symptoms were to worsen today or you develop numbness , vision changes, please report to emergency room or call 911.

## 2023-01-24 NOTE — Telephone Encounter (Signed)
 Copied from CRM [id number]. Topic: Clinical - Red Word Triage >> Jan 24, 2023  9:06 AM Franky GRADE wrote: Red Word that prompted transfer to Nurse Triage: Patient had been dealing with a head cold and started taking doxycycline  (VIBRA -TABS) 100 MG tablet which helped with the congestion; however for the last 3 days she has been dealing with severe migraines.   Chief Complaint: Headaches Symptoms: Nausea, Vomiting, Photopia  Frequency: Since Sunday Pertinent Negatives: Patient denies visual changes or neurological deficit.  Disposition: [] ED /[] Urgent Care (no appt availability in office) / [x] Appointment(In office/virtual)/ []  [facility]/ [] Home Care/ [] Refused Recommended Disposition /[] [facility]/ []  Follow-up with PCP Additional Notes: JR is a 48 year old female complaining of a severe headache that is not relieved by OTC medications. Ibuprofen  does not seem to relieve the pain, reports taking at least 400 mg multiple times yesterday with no relief. The patient states the headache is localized behind her eyes and to her temples. Reports lights make the headache worse, and only new medication is doxycycline . The patient reports the symptoms started around the time she started the antibiotic. In office appointment made for today.    Reason for Disposition  [1] SEVERE headache (e.g., excruciating) AND [2] not improved after 2 hours of pain medicine  Answer Assessment - Initial Assessment Questions 1. LOCATION: Where does it hurt?      Temples and behind the eyes   2. ONSET: When did the headache start? (Minutes, hours or days)      Friday  3. PATTERN: Does the pain come and go, or has it been constant since it started?     [place],   4. SEVERITY: How bad is the pain? and What does it keep you from doing?  (e.g., Scale 1-10; mild, moderate, or severe)   - MILD (1-3): doesn't interfere with normal activities    - MODERATE (4-7): interferes with normal  activities or awakens from sleep    - SEVERE (8-10): excruciating pain, unable to do any normal activities        8  5. RECURRENT SYMPTOM: Have you ever had headaches before? If Yes, ask: When was the last time? and What happened that time?      No  6. CAUSE: What do you think is causing the headache?     Doxycycline   7. MIGRAINE: Have you been diagnosed with migraine headaches? If Yes, ask: Is this headache similar?      No, but says the pain is similar   8. HEAD INJURY: Has there been any recent injury to the head?      No  9. OTHER SYMPTOMS: Do you have any other symptoms? (fever, stiff neck, eye pain, sore throat, cold symptoms)     Nausea, Vomiting, Congestion, Cough (Productive), Body Aches, Neck Stiffness,   10. PREGNANCY: Is there any chance you are pregnant? When was your last menstrual period?       2 Weeks Ago, No chance of pregnancy.  Protocols used: [facility]

## 2023-01-24 NOTE — Assessment & Plan Note (Addendum)
 New onset.  Reassuring neurologic and HEENT exam . headache atypical for her.  Worse HA of her life.  Headache is positional , worse when supine. pending stat MRI brain to evaluate for acute intracranial etiology.  Suspect sinus disease may be playing a role.  She is also not wearing her CPAP at this time and have strongly reiterated the importance of follow-up with pulmonology to discuss resuming.  Provided IM toradol  60mg  and allowed patient to rest in exam room.  Prescribe Zofran  to use for nausea.  Advised to complete doxycycline  as prescribed 01/22/2023 and virtual visit until MRI brain results. Close follow up.

## 2023-01-24 NOTE — Progress Notes (Signed)
 Assessment & Plan:  Acute nonintractable headache, unspecified headache type Assessment & Plan: New onset.  Reassuring neurologic and HEENT exam . headache atypical for her.  Worse HA of her life.  Headache is positional , worse when supine. pending stat MRI brain to evaluate for acute intracranial etiology.  Suspect sinus disease may be playing a role.  She is also not wearing her CPAP at this time and have strongly reiterated the importance of follow-up with pulmonology to discuss resuming.  Provided IM toradol  60mg  and allowed patient to rest in exam room.  Prescribe Zofran  to use for nausea.  Advised to complete doxycycline  as prescribed 01/22/2023 and virtual visit until MRI brain results. Close follow up.    Orders: -     Ondansetron ; Take 1 tablet (4 mg total) by mouth every 8 (eight) hours as needed for nausea or vomiting.  Dispense: 20 tablet; Refill: 0 -     MR BRAIN W WO CONTRAST; Future     Return precautions given.   Risks, benefits, and alternatives of the medications and treatment plan prescribed today were discussed, and patient expressed understanding.   Education regarding symptom management and diagnosis given to patient on AVS either electronically or printed.  Return in about 1 week (around 01/31/2023).  Rollene Northern, FNP  Subjective:    Patient ID: Melissa Higgins, female    DOB: Apr 20, 1975, 48 y.o.   MRN: [id number]  Melissa Higgins is a 48 y.o. female who presents today for an acute visit.    HPI: Complains of HA that started 4 days ago , waxes and wanes  HA is bilateral temporal behind both eyes  HA is improved if standing still. HA worse when laying down.   HA doesn't awaken her from sleep. HA is not worse with valsalva.   She has never had a sinus infection cause such a severe HA.   HA is  'stabbing' pain. She saw halo with black rim in bilateral eyes.   Endorses photophobia, chills, thick nasal discharge.   Some productive cough.    Bilious vomiting 2 days ago; since resolved.   No vision loss, fever.   She took ibuprofen  400mg  yesterday without relief  She has h/o HA however this HA is different.    She wears glasses.   She thinks sugar may contributed  She doesn't drink caffeine.   She has taken 4 doses of doxycycline .    No history of CKD, OSA   History of GERD, allergies  History of nasal sinus surgery   No h/o cancer  Non smoker Seen for virtual visit 01/22/2023 for suspected sinusitis and started on doxycycline   MRI brain ordered 11/30/2021 in the setting syncope without acute intracranial abnormality.  Paranasal sinus disease, 15 mm mucous retention cyst right maxillary  Primary advised to follow-up with [facility]; she is following with Dr Mcqeen, last seen Fall 2024.  She wears cipap 'sometimes.'  Sleep study 03/31/22; she is not wearing cipap.  Allergies: Augmentin [amoxicillin-pot clavulanate] Current Outpatient Medications on File Prior to Visit  Medication Sig Dispense Refill   albuterol  (VENTOLIN  HFA) 108 (90 Base) MCG/ACT inhaler Inhale 2 puffs into the lungs every 6 (six) hours as needed for wheezing or shortness of breath. 18 g 0   azelastine (ASTELIN) 0.1 % nasal spray Place 2 sprays into both nostrils 2 (two) times daily.     doxycycline  (VIBRA -TABS) 100 MG tablet Take 1 tablet (100 mg total) by mouth 2 (two) times  daily. 20 tablet 0   EPINEPHrine 0.3 mg/0.3 mL IJ SOAJ injection      esomeprazole  (NEXIUM ) 40 MG capsule TAKE 1 CAPSULE BY MOUTH TWICE DAILY BEFORE MEALS 180 capsule 1   gentamicin ointment (GARAMYCIN) 0.1 % Apply topically 3 (three) times daily.     Loratadine 10 MG CAPS Take by mouth.     meloxicam  (MOBIC ) 15 MG tablet TAKE 1 TABLET(15 MG) BY MOUTH DAILY 90 tablet 1   mupirocin  ointment (BACTROBAN ) 2 % Apply 1 Application topically 2 (two) times daily. 22 g 0   pseudoephedrine (SUDAFED) 120 MG 12 hr tablet Take 120 mg by mouth daily.     sertraline  (ZOLOFT )  100 MG tablet TAKE 1 TABLET(100 MG) BY MOUTH DAILY 90 tablet 1   traZODone  (DESYREL ) 50 MG tablet Take 50-100 mg by mouth at bedtime as needed.     valACYclovir  (VALTREX ) 500 MG tablet Take 1 tablet (500 mg total) by mouth daily. 30 tablet 2   tirzepatide  (ZEPBOUND ) 2.5 MG/0.5ML injection vial Inject 2.5 mg into the skin once a week. 2 mL 3   Current Facility-Administered Medications on File Prior to Visit  Medication Dose Route Frequency Provider Last Rate Last Admin   betamethasone  acetate-betamethasone  sodium phosphate (CELESTONE ) injection 3 mg  3 mg Intramuscular Once Evans, Brent M, DPM       betamethasone  acetate-betamethasone  sodium phosphate (CELESTONE ) injection 3 mg  3 mg Intramuscular Once Evans, Brent M, DPM       betamethasone  acetate-betamethasone  sodium phosphate (CELESTONE ) injection 3 mg  3 mg Intra-articular Once Evans, Brent M, DPM        Review of Systems  Constitutional:  Positive for chills. Negative for fever.  HENT:  Positive for congestion and rhinorrhea. Negative for sinus pressure.   Eyes:  Positive for photophobia and visual disturbance.  Respiratory:  Negative for cough.   Cardiovascular:  Negative for chest pain and palpitations.  Gastrointestinal:  Positive for nausea. Negative for vomiting (resolved).  Neurological:  Positive for headaches. Negative for dizziness.      Objective:    BP 124/88   Pulse 77   Temp 98.3 F (36.8 C) (Oral)   Ht 5' 10 (1.778 m)   Wt 252 lb 6.4 oz (114.5 kg)   LMP  (LMP Unknown)   SpO2 98%   BMI 36.22 kg/m   BP Readings from Last 3 Encounters:  01/24/23 124/88  01/12/23 118/68  12/02/22 112/70   Wt Readings from Last 3 Encounters:  01/24/23 252 lb 6.4 oz (114.5 kg)  01/12/23 257 lb (116.6 kg)  12/02/22 257 lb (116.6 kg)    Physical Exam Vitals reviewed.  Constitutional:      Appearance: She is well-developed.  HENT:     Head: Normocephalic and atraumatic.     Right Ear: Hearing, tympanic membrane, ear  canal and external ear normal. No swelling or tenderness. No middle ear effusion. Tympanic membrane is not erythematous or bulging.     Left Ear: Tympanic membrane, ear canal and external ear normal. No swelling or tenderness.  No middle ear effusion. Tympanic membrane is not erythematous or bulging.     Nose: Nose normal. No rhinorrhea.     Right Sinus: No maxillary sinus tenderness or frontal sinus tenderness.     Left Sinus: No maxillary sinus tenderness or frontal sinus tenderness.     Mouth/Throat:     Pharynx: Uvula midline. No posterior oropharyngeal erythema.  Eyes:     General: Lids are normal.  Lids are everted, no foreign bodies appreciated.     Conjunctiva/sclera: Conjunctivae normal.     Pupils: Pupils are equal, round, and reactive to light.     Comments: Normal fundus bilaterally   Cardiovascular:     Rate and Rhythm: Normal rate and regular rhythm.     Pulses: Normal pulses.     Heart sounds: Normal heart sounds.  Pulmonary:     Effort: Pulmonary effort is normal.     Breath sounds: Normal breath sounds. No wheezing, rhonchi or rales.  Lymphadenopathy:     Head:     Right side of head: No submental, submandibular, tonsillar, preauricular, posterior auricular or occipital adenopathy.     Left side of head: No submental, submandibular, tonsillar, preauricular, posterior auricular or occipital adenopathy.     Cervical: No cervical adenopathy.     Right cervical: No superficial, deep or posterior cervical adenopathy.    Left cervical: No superficial, deep or posterior cervical adenopathy.  Skin:    General: Skin is warm and dry.  Neurological:     Mental Status: She is alert.     Cranial Nerves: No cranial nerve deficit.     Sensory: No sensory deficit.     Deep Tendon Reflexes:     Reflex Scores:      Bicep reflexes are 2+ on the right side and 2+ on the left side.      Patellar reflexes are 2+ on the right side and 2+ on the left side.    Comments: Grip equal and  strong bilateral upper extremities. Gait strong and steady. Able to perform  finger-to-nose without difficulty.   Psychiatric:        Speech: Speech normal.        Behavior: Behavior normal.        Thought Content: Thought content normal.

## 2023-01-24 NOTE — Telephone Encounter (Signed)
 Given increased headache and new headache for her, she needs to be seen today.  (Per note, headache worse than yesterday)

## 2023-01-24 NOTE — Telephone Encounter (Signed)
 Noted.

## 2023-01-25 ENCOUNTER — Emergency Department: Payer: 59

## 2023-01-25 ENCOUNTER — Emergency Department
Admission: EM | Admit: 2023-01-25 | Discharge: 2023-01-26 | Disposition: A | Discharge status: Home / Self Care | Payer: 59 | Attending: Emergency Medicine | Admitting: Emergency Medicine

## 2023-01-25 ENCOUNTER — Encounter: Payer: Self-pay | Admitting: Family

## 2023-01-25 ENCOUNTER — Encounter: Payer: Self-pay | Admitting: Emergency Medicine

## 2023-01-25 ENCOUNTER — Other Ambulatory Visit: Payer: Self-pay

## 2023-01-25 DIAGNOSIS — H532 Diplopia: Secondary | ICD-10-CM

## 2023-01-25 DIAGNOSIS — R519 Headache, unspecified: Secondary | ICD-10-CM | POA: Diagnosis present

## 2023-01-25 DIAGNOSIS — J329 Chronic sinusitis, unspecified: Secondary | ICD-10-CM | POA: Diagnosis not present

## 2023-01-25 DIAGNOSIS — Z20822 Contact with and (suspected) exposure to covid-19: Secondary | ICD-10-CM | POA: Insufficient documentation

## 2023-01-25 LAB — COMPREHENSIVE METABOLIC PANEL
ALT: 14 U/L (ref 0–44)
AST: 19 U/L (ref 15–41)
Albumin: 3.9 g/dL (ref 3.5–5.0)
Alkaline Phosphatase: 75 U/L (ref 38–126)
Anion gap: 11 (ref 5–15)
BUN: 26 mg/dL — ABNORMAL HIGH (ref 6–20)
CO2: 21 mmol/L — ABNORMAL LOW (ref 22–32)
Calcium: 9 mg/dL (ref 8.9–10.3)
Chloride: 104 mmol/L (ref 98–111)
Creatinine, Ser: 0.87 mg/dL (ref 0.44–1.00)
GFR, Estimated: >60 mL/min (ref >60)
Glucose, Bld: 84 mg/dL (ref 70–99)
Potassium: 3.2 mmol/L — ABNORMAL LOW (ref 3.5–5.1)
Sodium: 136 mmol/L (ref 135–145)
Total Bilirubin: 1 mg/dL (ref 0.0–1.2)
Total Protein: 7.4 g/dL (ref 6.5–8.1)

## 2023-01-25 LAB — CBC WITH DIFFERENTIAL/PLATELET
Abs Immature Granulocytes: 0.02 10*3/uL (ref 0.00–0.07)
Basophils Absolute: 0.1 10*3/uL (ref 0.0–0.1)
Basophils Relative: 1 %
Eosinophils Absolute: 0.1 10*3/uL (ref 0.0–0.5)
Eosinophils Relative: 1 %
HCT: 43.8 % (ref 36.0–46.0)
Hemoglobin: 14.2 g/dL (ref 12.0–15.0)
Immature Granulocytes: 0 %
Lymphocytes Relative: 39 %
Lymphs Abs: 2.9 10*3/uL (ref 0.7–4.0)
MCH: 29.1 pg (ref 26.0–34.0)
MCHC: 32.4 g/dL (ref 30.0–36.0)
MCV: 89.8 fL (ref 80.0–100.0)
Monocytes Absolute: 0.5 10*3/uL (ref 0.1–1.0)
Monocytes Relative: 7 %
Neutro Abs: 4 10*3/uL (ref 1.7–7.7)
Neutrophils Relative %: 52 %
Platelets: 280 10*3/uL (ref 150–400)
RBC: 4.88 MIL/uL (ref 3.87–5.11)
RDW: 13.6 % (ref 11.5–15.5)
WBC: 7.6 10*3/uL (ref 4.0–10.5)
nRBC: 0 % (ref 0.0–0.2)

## 2023-01-25 LAB — RESP PANEL BY RT-PCR (RSV, FLU A&B, COVID)  RVPGX2
Influenza A by PCR: NEGATIVE
Influenza B by PCR: NEGATIVE
Resp Syncytial Virus by PCR: NEGATIVE
SARS Coronavirus 2 by RT PCR: NEGATIVE

## 2023-01-25 MED ORDER — DIPHENHYDRAMINE HCL 50 MG/ML IJ SOLN
50.0000 mg | Freq: Once | INTRAMUSCULAR | Status: AC
  Administered 2023-01-25: 50 mg via INTRAVENOUS
  Filled 2023-01-25: qty 1

## 2023-01-25 MED ORDER — SODIUM CHLORIDE 0.9 % IV BOLUS
1000.0000 mL | Freq: Once | INTRAVENOUS | Status: AC
  Administered 2023-01-25: 1000 mL via INTRAVENOUS

## 2023-01-25 MED ORDER — METOCLOPRAMIDE HCL 5 MG/ML IJ SOLN
10.0000 mg | Freq: Once | INTRAMUSCULAR | Status: AC
  Administered 2023-01-25: 10 mg via INTRAVENOUS
  Filled 2023-01-25: qty 2

## 2023-01-25 MED ORDER — IOHEXOL 350 MG/ML SOLN
75.0000 mL | Freq: Once | INTRAVENOUS | Status: AC | PRN
  Administered 2023-01-25: 75 mL via INTRAVENOUS

## 2023-01-25 MED ORDER — KETOROLAC TROMETHAMINE 30 MG/ML IJ SOLN
30.0000 mg | Freq: Once | INTRAMUSCULAR | Status: AC
  Administered 2023-01-25: 30 mg via INTRAVENOUS
  Filled 2023-01-25: qty 1

## 2023-01-25 NOTE — Telephone Encounter (Signed)
 I spoke with Dr. Janne Members neurology on the phone this afternoon regarding positional headache, diplopia.  Concern for pseudotumor, aneurysm, cranial nerve palsy based on new onset symptoms, vision changes.  He advised eval in ED to consider possible LP, MRA head.  Advised pt the same and she was agreeable to going to the ED. Gave triage report.   She is in ED now

## 2023-01-25 NOTE — Telephone Encounter (Signed)
 Noted.

## 2023-01-25 NOTE — ED Provider Triage Note (Signed)
 Emergency Medicine Provider Triage Evaluation Note  Melissa Higgins , a 48 y.o. female  was evaluated in triage.  Pt complains of headache, nauseous double vision.  Patient is taking doxycycline  for sinus infection started Sunday.  Patient had an MRI yesterday that was negative.  Patient has been able to eat due to naueas.  Review of Systems  Positive: Negative:   Physical Exam  BP (!) 141/96   Pulse 73   Temp 98 F (36.7 C) (Oral)   Resp 16   Ht 5\' 10"  (1.778 m)   Wt 114.4 kg   LMP  (LMP Unknown)   SpO2 98%   BMI 36.19 kg/m  Gen:   Awake, no distress   Resp:  Normal effort  MSK:   Moves extremities without difficulty  Other:    Medical Decision Making  Medically screening exam initiated at 4:32 PM.  Appropriate orders placed.  Melissa Higgins was informed that the remainder of the evaluation will be completed by another provider, this initial triage assessment does not replace that evaluation, and the importance of remaining in the ED until their evaluation is complete.  Patient sinus infection, possible dehydration, ordered CBC CMP   Awilda Lennox, PA-C 01/25/23 1633

## 2023-01-25 NOTE — ED Provider Notes (Signed)
 [facility] Provider Note    Event Date/Time   First MD Initiated Contact with Patient 01/25/23 2140     (approximate)  History   Chief Complaint: Migraine and Diplopia  HPI  Melissa Higgins is a 48 y.o. female with a past medical history of hyperlipidemia, sinus headaches, presents to the emergency department for 5 days of headache.  According to the patient since Sunday morning she has been experiencing a significant headache.  Patient states she has had nasal congestion as well and has a history of sinus infection/sinus headaches.  Patient states she continued to have significant headache on Monday so she called her doctor who called her in a prescription for doxycycline  to cover for possible sinusitis and ordered an outpatient MRI.  MRI was performed showing no significant findings (although does show sinus fluid and inflammation).  Patient received a shot of intramuscular Toradol  by her doctor was feeling better.  However today she said the headache was still present so she called her doctor back the doctor spoke to her neurologist who was concerned for possible aneurysm and sent her to the emergency department for further imaging.  Here patient continues state moderate headache she states it feels like a migraine, she has only had 1 prior migraine she says approximately 10 years ago.  Patient does have rhinorrhea and states sinus congestion with a history of sinus headaches.  Physical Exam   Triage Vital Signs: ED Triage Vitals  Encounter Vitals Group     BP 01/25/23 1628 (!) 141/96     Systolic BP Percentile --      Diastolic BP Percentile --      Pulse Rate 01/25/23 1628 73     Resp 01/25/23 1628 16     Temp 01/25/23 1628 98 F (36.7 C)     Temp Source 01/25/23 1628 Oral     SpO2 01/25/23 1628 98 %     Weight 01/25/23 1629 252 lb 3.3 oz (114.4 kg)     Height 01/25/23 1629 5\' 10"  (1.778 m)     Head Circumference --      Peak Flow --      Pain Score  01/25/23 1628 10     Pain Loc --      Pain Education --      Exclude from Growth Chart --     Most recent vital signs: Vitals:   01/25/23 1628 01/25/23 2115  BP: (!) 141/96 124/65  Pulse: 73 69  Resp: 16   Temp: 98 F (36.7 C)   SpO2: 98% 100%    General: Awake, no distress.  Moderate rhinorrhea CV:  Good peripheral perfusion.  Regular rate and rhythm  Resp:  Normal effort.  Equal breath sounds bilaterally.  Abd:  No distention.  Soft, nontender.  No rebound or guarding.  ED Results / Procedures / Treatments   RADIOLOGY  I have reviewed and interpreted CT head images.  I do not see any obvious large aneurysm on my evaluation awaiting radiology read.   MEDICATIONS ORDERED IN ED: Medications  sodium chloride  0.9 % bolus 1,000 mL (has no administration in time range)  ketorolac  (TORADOL ) 30 MG/ML injection 30 mg (has no administration in time range)  metoCLOPramide  (REGLAN ) injection 10 mg (has no administration in time range)  diphenhydrAMINE  (BENADRYL ) injection 50 mg (has no administration in time range)     IMPRESSION / MDM / ASSESSMENT AND PLAN / ED COURSE  I reviewed the triage vital  signs and the nursing notes.  Patient's presentation is most consistent with acute presentation with potential threat to life or bodily function.  Patient presents the emergency department for approximately 4 days of headache.  Reassuring outpatient MRI however does show sinus involvement which could be indicative of a sinus headache.  Patient's lab work is reassuring with a normal CBC, reassuring chemistry.  Normal white blood cell count, reassuring vital signs including blood pressure.  Patient states a history of sinus induced headaches to which this feels similar.  She has moderate rhinorrhea we will obtain a COVID/flu/RSV swab.  We will treat with Toradol , Reglan , Benadryl , fluids.  Given her doctor's concern for possible aneurysm we will obtain a CTA of the head to evaluate the  arteries.  Overall patient appears well highly suspect more sinus infection given the patient's rhinorrhea and MRI findings of sinus involvement.  Lab work is reassuring.  COVID/flu/RSV is negative.  CTA is pending.  Highly suspect more sinusitis.  Patient care signed out to oncoming provider.  FINAL CLINICAL IMPRESSION(S) / ED DIAGNOSES   Sinus headache Sinusitis   Note:  This document was prepared using Dragon voice recognition software and may include unintentional dictation errors.   Ruth Cove, MD 01/25/23 2250

## 2023-01-25 NOTE — Discharge Instructions (Signed)
 As we discussed please use over-the-counter pseudoephedrine which is available behind the counter without a prescription at most pharmacies.  Please follow-up with your doctor for further workup and management.  Return to the emergency department for any symptom concerning to yourself.

## 2023-01-25 NOTE — Telephone Encounter (Signed)
 Rasheedah I placed an urgent referral to [facility]; how soon can she be seen?     Called and left VM  I then called pt back  HA pain has improved since 3 days ago however 'nagging'.   Better yesterday after toradol  IM in the office.   Dull throbbing pain behind both eyes, worse on left. Sinus congestion is improving on doxycycline . Nausea has resolved.   Denies pulsatile tinnitus, confusion, vision loss.   She can normally close up however further away,  it is painless double vision.  She is looks sideways or with one eye , she will clearly.   both eyes and far away, she will see double.   She follows with Joette Mustard and also [facility] for glasses and contacts.   No family h/o aneursym  Dad had brain cancer.    Advised low threshold to go to ED for change in vision  Will collaborate with neurology Stat referral to ophthalmology

## 2023-01-25 NOTE — ED Triage Notes (Signed)
 First Nurse Note: Patient sent over from doctor's office for double vision and headache. PCP reports that patient had negative MRI yesterday. PCP spoke with neurology and wanting further imaging.

## 2023-01-25 NOTE — ED Triage Notes (Signed)
 See FN note. Patient to ER with complaints of continued Migraine and sinus infection since Sunday. Patient states she started doxy for the sinus infection, had negative MRI for the migraine and double vision yesterday but continues to have symptoms. Patient endorses when bending over the pain is worse.

## 2023-01-26 ENCOUNTER — Telehealth: Payer: Self-pay | Admitting: Family

## 2023-01-26 ENCOUNTER — Encounter: Payer: Self-pay | Admitting: Family

## 2023-01-26 NOTE — Telephone Encounter (Signed)
What is status of ophthalmology referral?

## 2023-01-26 NOTE — ED Provider Notes (Signed)
Patient signed out with 5 days of headache pending CTA of the head and neck.  CTA head and neck without any acute pathology.  Patient reassessed and feeling better at this time.  Will discharge according to previous provider plan with outpatient follow-up.  Patient agreeable with this plan.   Janith Lima, MD 01/26/23 [phone]

## 2023-01-27 ENCOUNTER — Other Ambulatory Visit: Payer: Self-pay | Admitting: Family

## 2023-01-27 DIAGNOSIS — H532 Diplopia: Secondary | ICD-10-CM

## 2023-01-27 DIAGNOSIS — R519 Headache, unspecified: Secondary | ICD-10-CM

## 2023-01-27 NOTE — Telephone Encounter (Signed)
Noted. Sent pt mychart message.  

## 2023-01-31 NOTE — Telephone Encounter (Signed)
Received and reviewed message today. Given that this message is from 01/30/23 - please call and get updated information to see how she is doing now. Confirm medication currently taking for headache.

## 2023-02-02 ENCOUNTER — Telehealth: Payer: Self-pay | Admitting: Family

## 2023-02-02 DIAGNOSIS — J329 Chronic sinusitis, unspecified: Secondary | ICD-10-CM

## 2023-02-02 DIAGNOSIS — R519 Headache, unspecified: Secondary | ICD-10-CM

## 2023-02-02 MED ORDER — PREDNISONE 10 MG PO TABS: ORAL_TABLET | ORAL | 0 refills | Status: DC

## 2023-02-02 NOTE — Telephone Encounter (Signed)
Noted.  Reviewed phone message.  Melissa Higgins has started her on prednisone.  Please call her for f/u and confirm doing ok.  Let us know if persistent problems.

## 2023-02-02 NOTE — Telephone Encounter (Signed)
Spoke with pt   Overall 'headache is gone'.  She has noticed if leans to one side and like 'head is full sand' , such as temporal pressure. Associated with posterior ache in the neck.   No fever, vision loss, arm weakness, ear pain.  Nausea and congestion has resolved HA is behind both eyes  H/o vertigo. Room is not spinning  She is waiting on new glasses; new contacts lenses yesterday did seem to improve HA yesterday.   She will notice if she walks such as from family room to her kitchen, she will  She has completed doxycyline.  She has taken prednisone in the past without side effects.    Plan Neurology referral is pending Start prednisone taper Consider daily preventative

## 2023-02-06 ENCOUNTER — Telehealth: Payer: Self-pay | Admitting: Internal Medicine

## 2023-02-06 NOTE — Telephone Encounter (Signed)
Lft pt vm to call ofc to verify location of neurology referral. Thank you!

## 2023-02-07 NOTE — Telephone Encounter (Signed)
Reviewed. Please call her and let her know that I am glad she is doing better. Agree with seeing ophthalmology tomorrow. Let us know if does not hear from neurology.  Also, let us know if any change or if needs anything.

## 2023-02-13 NOTE — Telephone Encounter (Signed)
I do not see neurology as scheduled.  Patient was open to seeing [facility] neurology or Gavin Potters, whichever is soonest so I didn't specify on the referral

## 2023-02-14 ENCOUNTER — Other Ambulatory Visit: Payer: Self-pay | Admitting: Ophthalmology

## 2023-02-14 DIAGNOSIS — G932 Benign intracranial hypertension: Secondary | ICD-10-CM

## 2023-02-20 ENCOUNTER — Ambulatory Visit
Admission: RE | Admit: 2023-02-20 | Discharge: 2023-02-20 | Disposition: A | Discharge status: Home / Self Care | Payer: 59 | Source: Ambulatory Visit | Attending: Ophthalmology | Admitting: Ophthalmology

## 2023-02-20 DIAGNOSIS — G932 Benign intracranial hypertension: Secondary | ICD-10-CM | POA: Insufficient documentation

## 2023-02-20 MED ORDER — GADOBUTROL 1 MMOL/ML IV SOLN
10.0000 mL | Freq: Once | INTRAVENOUS | Status: AC | PRN
  Administered 2023-02-20: 10 mL via INTRAVENOUS

## 2023-03-06 ENCOUNTER — Other Ambulatory Visit: Payer: Self-pay | Admitting: Neurology

## 2023-03-06 DIAGNOSIS — G932 Benign intracranial hypertension: Secondary | ICD-10-CM

## 2023-03-06 DIAGNOSIS — R519 Headache, unspecified: Secondary | ICD-10-CM

## 2023-03-15 NOTE — Progress Notes (Signed)
 Patient for DG Lumbar Puncture on Thurs 03/16/23, I called and spoke with the patient on the phone and gave pre-procedure instructions. Pt was made aware to be here at 10:30a and check in at the new entrance. Pt stated understanding.  Called 03/06/23

## 2023-03-16 ENCOUNTER — Ambulatory Visit
Admission: RE | Admit: 2023-03-16 | Discharge: 2023-03-16 | Disposition: A | Discharge status: Home / Self Care | Payer: 59 | Source: Ambulatory Visit | Attending: Neurology | Admitting: Neurology

## 2023-03-16 VITALS — BP 114/72 | HR 61 | Temp 98.0°F | Ht 70.0 in | Wt 259.7 lb

## 2023-03-16 DIAGNOSIS — G932 Benign intracranial hypertension: Secondary | ICD-10-CM | POA: Insufficient documentation

## 2023-03-16 DIAGNOSIS — R519 Headache, unspecified: Secondary | ICD-10-CM | POA: Diagnosis present

## 2023-03-16 DIAGNOSIS — G441 Vascular headache, not elsewhere classified: Secondary | ICD-10-CM | POA: Insufficient documentation

## 2023-03-16 LAB — PROTEIN AND GLUCOSE, CSF
Glucose, CSF: 57 mg/dL (ref 40–70)
Total  Protein, CSF: 32 mg/dL (ref 15–45)

## 2023-03-16 LAB — CSF CELL COUNT WITH DIFFERENTIAL
Eosinophils, CSF: 0 % (ref 0–1)
Lymphs, CSF: 100 % — ABNORMAL HIGH (ref 40–80)
Monocyte-Macrophage-Spinal Fluid: 0 % — ABNORMAL LOW (ref 15–45)
RBC Count, CSF: 5 /mm3 — ABNORMAL HIGH
Segmented Neutrophils-CSF: 0 % (ref 0–6)
WBC, CSF: 9 /mm3 — ABNORMAL HIGH (ref 0–5)

## 2023-03-16 LAB — GLUCOSE, RANDOM: Glucose, Bld: 83 mg/dL (ref 70–99)

## 2023-03-16 NOTE — Procedures (Signed)
 PROCEDURE SUMMARY:  Successful fluoroscopic guided lumbar puncture. No immediate complications.  Pt tolerated well.   EBL = none  Please see full dictation in imaging section of Epic for procedure details.

## 2023-03-16 NOTE — Discharge Instructions (Signed)
Lumbar Puncture, Care After Refer to this sheet in the next few weeks. These instructions provide you with information on caring for yourself after your procedure. Your health care provider may also give you more specific instructions. Your treatment has been planned according to current medical practices, but problems sometimes occur. Call your health care provider if you have any problems or questions after your procedure. What can I expect after the procedure? After your procedure, it is typical to have the following sensations: Mild discomfort or pain at the insertion site. Mild headache that is relieved with pain medicines.  Follow these instructions at home:  Avoid lifting anything heavier than 10 lb (4.5 kg) for at least 12 hours after the procedure. Drink enough fluids to keep your urine clear or pale yellow. Lay flat or as flat as possible for the remainder of the day. Contact a health care provider if: You have fever or chills. You have nausea or vomiting. You have a headache that lasts for more than 2 days. Get help right away if: You have any numbness or tingling in your legs. You are unable to control your bowel or bladder. You have bleeding or swelling in your back at the insertion site. You are dizzy or faint. This information is not intended to replace advice given to you by your health care provider. Make sure you discuss any questions you have with your health care provider. Document Released: 01/01/2013 Document Revised: 06/04/2015 Document Reviewed: 09/04/2012 Elsevier Interactive Patient Education  2017 Elsevier Inc. 

## 2023-03-19 ENCOUNTER — Encounter: Payer: Self-pay | Admitting: Internal Medicine

## 2023-03-20 LAB — IGG CSF INDEX
Albumin CSF-mCnc: 22 mg/dL (ref 8–37)
Albumin: 3.8 g/dL — ABNORMAL LOW (ref 3.9–4.9)
CSF IgG Index: 0.5 (ref 0.0–0.7)
IgG (Immunoglobin G), Serum: 911 mg/dL (ref 586–1602)
IgG, CSF: 2.6 mg/dL (ref 0.0–6.7)
IgG/Alb Ratio, CSF: 0.12 (ref 0.00–0.25)

## 2023-03-20 NOTE — Telephone Encounter (Signed)
 Please call her and let her know that I will try to contact neurology regarding test results and f/u.

## 2023-03-20 NOTE — Telephone Encounter (Signed)
 Pt notified and verbalized understanding.

## 2023-03-23 ENCOUNTER — Telehealth: Payer: Self-pay

## 2023-03-23 NOTE — Telephone Encounter (Signed)
 Copied from CRM [id number]. Topic: Clinical - Lab/Test Results >> Mar 23, 2023 10:10 AM [place] J wrote: Reason for CRM: Patient wanted to see if there was an update on her recent lab results and if Dr. Lorin Picket ever got in contact with neurology. Patient has some worrisome concerns.

## 2023-03-23 NOTE — Telephone Encounter (Signed)
 I have called over to neurology. They have messaged for review and waiting for response.

## 2023-03-24 NOTE — Telephone Encounter (Signed)
 Spoke to pt and informed her of the following per Dr Lorin Picket  I have called over to neurology. They have messaged for review and waiting for response.  Pt verbalized understanding

## 2023-04-03 LAB — OLIGOCLONAL BANDS, CSF + SERM

## 2023-04-04 ENCOUNTER — Ambulatory Visit: Admitting: Internal Medicine

## 2023-04-04 VITALS — BP 112/70 | HR 81 | Temp 98.0°F | Resp 16 | Ht 70.0 in | Wt 261.0 lb

## 2023-04-04 DIAGNOSIS — R5383 Other fatigue: Secondary | ICD-10-CM | POA: Diagnosis not present

## 2023-04-04 DIAGNOSIS — R519 Headache, unspecified: Secondary | ICD-10-CM

## 2023-04-04 DIAGNOSIS — G4730 Sleep apnea, unspecified: Secondary | ICD-10-CM

## 2023-04-04 NOTE — Progress Notes (Unsigned)
 Subjective:    Patient ID: Melissa Higgins, female    DOB: 1975/10/26, 48 y.o.   MRN: [id number]  Patient here for  Chief Complaint  Patient presents with   Medical Management of Chronic Issues    HPI Here for work in appt - work in to discuss migraines and fatigue. Started having headache 01/2023. Has had extensive w/up - including CT, MRI/MRV. Saw neurology 02/27/23 - f/u headaches. Discussed possible idiopathic intracranial hypertension. S/p LP. Reports opening pressure 21. Recommended starting topamax. Reports topamax did not help. Did have a post LP headache. Did not feel well for several days after spinal tap. Starting on 03/23/23 - had five days where she felt good. Saw ophthalmology - given persistent throbbing headache. Has been seen by them on a few occasions. Visual field tests performed. Was started on acetazolamide. Has been on this medication for approximately two weeks. Taking tid - mostly. Headache is better. Her main concern is increased fatigue. Also reports she feels like she needs to adjust her glasses - head not back to normal. Eating. No vomiting reported. Breathing stable.    Past Medical History:  Diagnosis Date   C. difficile colitis    augment use   Depression    Dysmenorrhea    Environmental allergies    allergy shots   History of frequent urinary tract infections    Hypercholesterolemia    Sleep apnea    CPAP used at home   Past Surgical History:  Procedure Laterality Date   COLONOSCOPY WITH PROPOFOL N/A 01/18/2019   Procedure: COLONOSCOPY WITH PROPOFOL;  Surgeon: Wyline Mood, MD;  Location: [facility] ENDOSCOPY;  Service: Gastroenterology;  Laterality: N/A;   NASAL SINUS SURGERY  2003   UTERINE FIBROID SURGERY  2019   Family History  Problem Relation Age of Onset   Hyperlipidemia Mother    Asthma Father    Hyperlipidemia Father    Allergies Father    Brain cancer Father 75   Migraines Maternal Aunt    Breast cancer Neg Hx    Social History    Socioeconomic History   Marital status: Divorced    Spouse name: Not on file   Number of children: 0   Years of education: college   Highest education level: Master's degree (e.g., MA, MS, MEng, MEd, MSW, MBA)  Occupational History   Not on file  Tobacco Use   Smoking status: Never   Smokeless tobacco: Never  Vaping Use   Vaping status: Never Used  Substance and Sexual Activity   Alcohol use: Yes    Alcohol/week: 0.0 standard drinks of alcohol    Comment: occasional   Drug use: No   Sexual activity: Yes    Birth control/protection: None  Other Topics Concern   Not on file  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   Social Drivers of Corporate investment banker Strain: Low Risk  (02/27/2023)   Received from [facility]   Overall Financial Resource Strain (CARDIA)    Difficulty of Paying Living Expenses: Not hard at all  Food Insecurity: No Food Insecurity (02/27/2023)   Received from [facility]   Hunger Vital Sign    Worried About Running Out of Food in the Last Year: Never true    Ran Out of Food in the Last Year: Never true  Transportation Needs: No Transportation Needs (02/27/2023)   Received from [facility]  PRAPARE - Transportation    In the past 12 months, has lack of transportation kept you from medical appointments or from getting medications?: No    Lack of Transportation (Non-Medical): No  Physical Activity: Inactive (01/08/2023)   Exercise Vital Sign    Days of Exercise per Week: 0 days    Minutes of Exercise per Session: 20 min  Stress: No Stress Concern Present (01/08/2023)   [employer] of Occupational Health - Occupational Stress Questionnaire    Feeling of Stress : Only a little  Social Connections: Socially Isolated (01/08/2023)   Social Connection and Isolation Panel [NHANES]    Frequency of Communication with Friends and Family: More than  three times a week    Frequency of Social Gatherings with Friends and Family: Once a week    Attends Religious Services: Never    Database administrator or Organizations: No    Attends Engineer, structural: Not on file    Marital Status: Divorced     Review of Systems  Constitutional:  Positive for fatigue. Negative for unexpected weight change.  HENT:  Negative for sinus pressure.        No significant increased congestion.   Respiratory:  Negative for cough, chest tightness and shortness of breath.   Cardiovascular:  Negative for chest pain, palpitations and leg swelling.  Gastrointestinal:  Negative for abdominal pain, diarrhea, nausea and vomiting.  Genitourinary:  Negative for difficulty urinating and dysuria.  Musculoskeletal:  Negative for joint swelling and myalgias.  Skin:  Negative for color change and rash.  Neurological:  Negative for dizziness.       Headache - improved.   Psychiatric/Behavioral:  Negative for agitation and dysphoric mood.        Increased stress related to current medical concerns and tests.        Objective:     BP 112/70   Pulse 81   Temp 98 F (36.7 C)   Resp 16   Ht 5\' 10"  (1.778 m)   Wt 261 lb (118.4 kg)   LMP 12/21/2022 (Approximate)   SpO2 98%   BMI 37.45 kg/m  Wt Readings from Last 3 Encounters:  04/04/23 261 lb (118.4 kg)  03/16/23 259 lb 11.2 oz (117.8 kg)  01/25/23 252 lb 3.3 oz (114.4 kg)    Physical Exam Vitals reviewed.  Constitutional:      General: She is not in acute distress.    Appearance: Normal appearance.  HENT:     Head: Normocephalic and atraumatic.     Right Ear: External ear normal.     Left Ear: External ear normal.     Mouth/Throat:     Pharynx: No oropharyngeal exudate or posterior oropharyngeal erythema.  Eyes:     General: No scleral icterus.       Right eye: No discharge.        Left eye: No discharge.     Conjunctiva/sclera: Conjunctivae normal.  Neck:     Thyroid: No thyromegaly.   Cardiovascular:     Rate and Rhythm: Normal rate and regular rhythm.  Pulmonary:     Effort: No respiratory distress.     Breath sounds: Normal breath sounds. No wheezing.  Abdominal:     General: Bowel sounds are normal.     Palpations: Abdomen is soft.     Tenderness: There is no abdominal tenderness.  Musculoskeletal:        General: No swelling or tenderness.     Cervical back:  Neck supple. No tenderness.  Lymphadenopathy:     Cervical: No cervical adenopathy.  Skin:    Findings: No erythema or rash.  Neurological:     Mental Status: She is alert.  Psychiatric:        Mood and Affect: Mood normal.        Behavior: Behavior normal.         Outpatient Encounter Medications as of 04/04/2023  Medication Sig   acetaZOLAMIDE (DIAMOX) 250 MG tablet Take 250 mg by mouth 4 (four) times daily.   albuterol (VENTOLIN HFA) 108 (90 Base) MCG/ACT inhaler Inhale 2 puffs into the lungs every 6 (six) hours as needed for wheezing or shortness of breath.   cetirizine (ZYRTEC) 10 MG tablet Take 10 mg by mouth daily.   EPINEPHrine 0.3 mg/0.3 mL IJ SOAJ injection    esomeprazole (NEXIUM) 40 MG capsule TAKE 1 CAPSULE BY MOUTH TWICE DAILY BEFORE MEALS   gentamicin ointment (GARAMYCIN) 0.1 % Apply topically 3 (three) times daily.   meloxicam (MOBIC) 15 MG tablet TAKE 1 TABLET(15 MG) BY MOUTH DAILY   mupirocin ointment (BACTROBAN) 2 % Apply 1 Application topically 2 (two) times daily.   pseudoephedrine (SUDAFED) 120 MG 12 hr tablet Take 120 mg by mouth daily.   sertraline (ZOLOFT) 100 MG tablet TAKE 1 TABLET(100 MG) BY MOUTH DAILY   traZODone (DESYREL) 50 MG tablet Take 50-100 mg by mouth at bedtime as needed.   [DISCONTINUED] azelastine (ASTELIN) 0.1 % nasal spray Place 2 sprays into both nostrils 2 (two) times daily.   [DISCONTINUED] doxycycline (VIBRA-TABS) 100 MG tablet Take 1 tablet (100 mg total) by mouth 2 (two) times daily.   [DISCONTINUED] Loratadine 10 MG CAPS Take by mouth.    [DISCONTINUED] ondansetron (ZOFRAN-ODT) 4 MG disintegrating tablet Take 1 tablet (4 mg total) by mouth every 8 (eight) hours as needed for nausea or vomiting.   [DISCONTINUED] predniSONE (DELTASONE) 10 MG tablet Take 4 tablets ( total 40 mg) by mouth for 2 days; take 3 tablets ( total 30 mg) by mouth for 2 days; take 2 tablets ( total 20 mg) by mouth for 1 day; take 1 tablet ( total 10 mg) by mouth for 1 day.   [DISCONTINUED] tirzepatide (ZEPBOUND) 2.5 MG/0.5ML injection vial Inject 2.5 mg into the skin once a week.   [DISCONTINUED] topiramate (TOPAMAX) 50 MG tablet Take 50 mg by mouth daily.   [DISCONTINUED] valACYclovir (VALTREX) 500 MG tablet Take 1 tablet (500 mg total) by mouth daily.   Facility-Administered Encounter Medications as of 04/04/2023  Medication   betamethasone acetate-betamethasone sodium phosphate (CELESTONE) injection 3 mg   betamethasone acetate-betamethasone sodium phosphate (CELESTONE) injection 3 mg   betamethasone acetate-betamethasone sodium phosphate (CELESTONE) injection 3 mg     Lab Results  Component Value Date   WBC 9.8 04/04/2023   HGB 13.2 04/04/2023   HCT 40.1 04/04/2023   PLT 253.0 04/04/2023   GLUCOSE 98 04/04/2023   CHOL 207 (H) 07/07/2022   TRIG 112.0 07/07/2022   HDL 41.10 07/07/2022   LDLCALC 143 (H) 07/07/2022   ALT 14 01/25/2023   AST 19 01/25/2023   NA 136 04/04/2023   K 3.7 04/04/2023   CL 107 04/04/2023   CREATININE 0.91 04/04/2023   BUN 27 (H) 04/04/2023   CO2 21 04/04/2023   TSH 2.17 07/07/2022   HGBA1C 5.7 03/05/2020    DG FL GUIDED LUMBAR PUNCTURE Result Date: 03/16/2023 CLINICAL DATA:  Patient is a 48 y/o female with history of migraines.  Patient presents for a diagnostic and therapeutic lumbar puncture due to suspected idiopathic intracranial hypertension. EXAM: DIAGNOSTIC LUMBAR PUNCTURE UNDER FLUOROSCOPIC GUIDANCE FLUOROSCOPY TIME:  Radiation Exposure Index (as provided by the fluoroscopic device): 12.10 mGy If the device does  not provide the exposure index: Fluoroscopy Time (in minutes and seconds): 000.8 min PROCEDURE: Informed consent was obtained from the patient prior to the procedure, including potential complications of headache, allergy, and pain. With the patient prone, the lower back was prepped with Betadine. 1% Lidocaine was used for local anesthesia. Lumbar puncture was performed at the L4-L5 level using a 20 gauge needle with return of clear CSF with an opening pressure of 21 cm water. 12 ml of CSF were obtained for laboratory studies. Closing pressure was 14. The patient tolerated the procedure well and there were no apparent complications. IMPRESSION: Successful lumbar puncture without immediate complications. Performed by Philipp Ovens PA-C Electronically Signed   By: Irish Lack M.D.   On: 03/16/2023 12:26       Assessment & Plan:  Other fatigue Assessment & Plan: Persistent increased fatigue and not feeling well. Headache better. Using cpap. She is on acetazolamide now. Increased fatigue worsened over the past couple of weeks. Unclear if medication aggravating. Will hold the dose tonight and in the am. Check cbc and metabolic panel. Obtain ophthalmology records. May need to adjust acetazolamide dose.   Orders: -     CBC with Differential/Platelet -     Basic metabolic panel with GFR  Acute nonintractable headache, unspecified headache type Assessment & Plan: Persistent. Has had extensive w/up as outlined. Seeing ophthalmology. On acetazolamide. Headache is better. Head does not feel back to normal, but no pain. Request referral to neurology Joseph Art). Check labs as outlined. Follow. Continue cpap.   Orders: -     Ambulatory referral to Neurology  Sleep apnea, unspecified type Assessment & Plan: Using cpap.       Dale Courtland, MD

## 2023-04-05 LAB — BASIC METABOLIC PANEL
BUN: 27 mg/dL — ABNORMAL HIGH (ref 6–23)
CO2: 21 meq/L (ref 19–32)
Calcium: 8.7 mg/dL (ref 8.4–10.5)
Chloride: 107 meq/L (ref 96–112)
Creatinine, Ser: 0.91 mg/dL (ref 0.40–1.20)
GFR: 74.41 mL/min (ref >60.00)
Glucose, Bld: 98 mg/dL (ref 70–99)
Potassium: 3.7 meq/L (ref 3.5–5.1)
Sodium: 136 meq/L (ref 135–145)

## 2023-04-05 LAB — CBC WITH DIFFERENTIAL/PLATELET
Basophils Absolute: 0.1 10*3/uL (ref 0.0–0.1)
Basophils Relative: 0.5 % (ref 0.0–3.0)
Eosinophils Absolute: 0.3 10*3/uL (ref 0.0–0.7)
Eosinophils Relative: 2.8 % (ref 0.0–5.0)
HCT: 40.1 % (ref 36.0–46.0)
Hemoglobin: 13.2 g/dL (ref 12.0–15.0)
Lymphocytes Relative: 26.6 % (ref 12.0–46.0)
Lymphs Abs: 2.6 10*3/uL (ref 0.7–4.0)
MCHC: 32.9 g/dL (ref 30.0–36.0)
MCV: 90.3 fl (ref 78.0–100.0)
Monocytes Absolute: 0.6 10*3/uL (ref 0.1–1.0)
Monocytes Relative: 6.4 % (ref 3.0–12.0)
Neutro Abs: 6.3 10*3/uL (ref 1.4–7.7)
Neutrophils Relative %: 63.7 % (ref 43.0–77.0)
Platelets: 253 10*3/uL (ref 150.0–400.0)
RBC: 4.44 Mil/uL (ref 3.87–5.11)
RDW: 13.8 % (ref 11.5–15.5)
WBC: 9.8 10*3/uL (ref 4.0–10.5)

## 2023-04-07 ENCOUNTER — Encounter: Payer: Self-pay | Admitting: Internal Medicine

## 2023-04-07 NOTE — Assessment & Plan Note (Signed)
 Persistent. Has had extensive w/up as outlined. Seeing ophthalmology. On acetazolamide. Headache is better. Head does not feel back to normal, but no pain. Request referral to neurology Joseph Art). Check labs as outlined. Follow. Continue cpap.

## 2023-04-07 NOTE — Assessment & Plan Note (Signed)
 Using cpap

## 2023-04-07 NOTE — Assessment & Plan Note (Signed)
 Persistent increased fatigue and not feeling well. Headache better. Using cpap. She is on acetazolamide now. Increased fatigue worsened over the past couple of weeks. Unclear if medication aggravating. Will hold the dose tonight and in the am. Check cbc and metabolic panel. Obtain ophthalmology records. May need to adjust acetazolamide dose.

## 2023-04-12 ENCOUNTER — Ambulatory Visit (INDEPENDENT_AMBULATORY_CARE_PROVIDER_SITE_OTHER): Payer: 59 | Admitting: Internal Medicine

## 2023-04-12 VITALS — BP 120/70 | HR 83 | Temp 98.0°F | Resp 16 | Ht 70.0 in | Wt 260.0 lb

## 2023-04-12 DIAGNOSIS — R739 Hyperglycemia, unspecified: Secondary | ICD-10-CM

## 2023-04-12 DIAGNOSIS — Z9109 Other allergy status, other than to drugs and biological substances: Secondary | ICD-10-CM

## 2023-04-12 DIAGNOSIS — G479 Sleep disorder, unspecified: Secondary | ICD-10-CM

## 2023-04-12 DIAGNOSIS — D649 Anemia, unspecified: Secondary | ICD-10-CM

## 2023-04-12 DIAGNOSIS — E7800 Pure hypercholesterolemia, unspecified: Secondary | ICD-10-CM

## 2023-04-12 DIAGNOSIS — R5383 Other fatigue: Secondary | ICD-10-CM

## 2023-04-12 DIAGNOSIS — R519 Headache, unspecified: Secondary | ICD-10-CM

## 2023-04-12 DIAGNOSIS — K219 Gastro-esophageal reflux disease without esophagitis: Secondary | ICD-10-CM

## 2023-04-12 DIAGNOSIS — G4730 Sleep apnea, unspecified: Secondary | ICD-10-CM

## 2023-04-12 NOTE — Progress Notes (Signed)
 Subjective:    Patient ID: Melissa Higgins, female    DOB: [birth year], 48 y.o.   MRN: [id number]  Patient here for  Chief Complaint  Patient presents with   Medical Management of Chronic Issues    HPI Here for a scheduled follow up. Evaluated 04/04/23 - f/u migraines and fatigue. Has had extensive w/up as outlined last note. MRI, MRV and CT angiography normal. Was advised to decrease acetazolamide. Comes in today with continued fatigue. No headache. States her head does not feel back to normal, but no headache. Reports is hard to focus. Does have some congestion. Started mucinex. Taking singulair. No chest congestion. Started period - yesterday. Is using cpap regularly. Breathing stable. Increased stress related to her current ongoing symptoms, but feels handling other stressors ok. Continues on sertraline.    Past Medical History:  Diagnosis Date   C. difficile colitis    augment use   Depression    Dysmenorrhea    Environmental allergies    allergy shots   History of frequent urinary tract infections    Hypercholesterolemia    Sleep apnea    CPAP used at home   Past Surgical History:  Procedure Laterality Date   COLONOSCOPY WITH PROPOFOL N/A 01/18/2019   Procedure: COLONOSCOPY WITH PROPOFOL;  Surgeon: Wyline Mood, MD;  Location: [facility] ENDOSCOPY;  Service: Gastroenterology;  Laterality: N/A;   NASAL SINUS SURGERY  2003   UTERINE FIBROID SURGERY  2019   Family History  Problem Relation Age of Onset   Hyperlipidemia Mother    Asthma Father    Hyperlipidemia Father    Allergies Father    Brain cancer Father 51   Migraines Maternal Aunt    Breast cancer Neg Hx    Social History   Socioeconomic History   Marital status: Divorced    Spouse name: Not on file   Number of children: 0   Years of education: college   Highest education level: Master's degree (e.g., MA, MS, MEng, MEd, MSW, MBA)  Occupational History   Not on file  Tobacco Use   Smoking status: Never    Smokeless tobacco: Never  Vaping Use   Vaping status: Never Used  Substance and Sexual Activity   Alcohol use: Yes    Alcohol/week: 0.0 standard drinks of alcohol    Comment: occasional   Drug use: No   Sexual activity: Yes    Birth control/protection: None  Other Topics Concern   Not on file  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   Social Drivers of Corporate investment banker Strain: Low Risk  (02/27/2023)   Received from [facility]   Overall Financial Resource Strain (CARDIA)    Difficulty of Paying Living Expenses: Not hard at all  Food Insecurity: No Food Insecurity (02/27/2023)   Received from [facility]   Hunger Vital Sign    Worried About Running Out of Food in the Last Year: Never true    Ran Out of Food in the Last Year: Never true  Transportation Needs: No Transportation Needs (02/27/2023)   Received from [facility] - Transportation    In the past 12 months, has lack of transportation kept you from medical appointments or from getting medications?: No    Lack of Transportation (Non-Medical): No  Physical Activity: Inactive (01/08/2023)   Exercise Vital Sign    Days of Exercise per  Week: 0 days    Minutes of Exercise per Session: 20 min  Stress: No Stress Concern Present (01/08/2023)   [employer] of Occupational Health - Occupational Stress Questionnaire    Feeling of Stress : Only a little  Social Connections: Socially Isolated (01/08/2023)   Social Connection and Isolation Panel [NHANES]    Frequency of Communication with Friends and Family: More than three times a week    Frequency of Social Gatherings with Friends and Family: Once a week    Attends Religious Services: Never    Database administrator or Organizations: No    Attends Engineer, structural: Not on file    Marital Status: Divorced     Review of Systems   Constitutional:  Positive for fatigue. Negative for appetite change and unexpected weight change.  HENT:  Positive for congestion.   Respiratory:  Negative for cough, chest tightness and shortness of breath.   Cardiovascular:  Negative for chest pain, palpitations and leg swelling.  Gastrointestinal:  Negative for abdominal pain, diarrhea, nausea and vomiting.  Genitourinary:  Negative for difficulty urinating and dysuria.  Musculoskeletal:  Negative for joint swelling and myalgias.  Skin:  Negative for color change and rash.  Neurological:  Negative for dizziness.       No headache now. Some trouble focusing as outlined.   Psychiatric/Behavioral:  Negative for agitation and dysphoric mood.        Increased stress related to her ongoing symptoms.        Objective:     BP 120/70   Pulse 83   Temp 98 F (36.7 C)   Resp 16   Ht 5\' 10"  (1.778 m)   Wt 260 lb (117.9 kg)   LMP 12/21/2022 (Approximate)   SpO2 98%   BMI 37.31 kg/m  Wt Readings from Last 3 Encounters:  04/12/23 260 lb (117.9 kg)  04/04/23 261 lb (118.4 kg)  03/16/23 259 lb 11.2 oz (117.8 kg)    Physical Exam Vitals reviewed.  Constitutional:      General: She is not in acute distress.    Appearance: Normal appearance.  HENT:     Head: Normocephalic and atraumatic.     Right Ear: External ear normal.     Left Ear: External ear normal.     Mouth/Throat:     Pharynx: No oropharyngeal exudate or posterior oropharyngeal erythema.  Eyes:     General: No scleral icterus.       Right eye: No discharge.        Left eye: No discharge.     Conjunctiva/sclera: Conjunctivae normal.  Neck:     Thyroid: No thyromegaly.  Cardiovascular:     Rate and Rhythm: Normal rate and regular rhythm.  Pulmonary:     Effort: No respiratory distress.     Breath sounds: Normal breath sounds. No wheezing.  Abdominal:     General: Bowel sounds are normal.     Palpations: Abdomen is soft.     Tenderness: There is no abdominal  tenderness.  Musculoskeletal:        General: No swelling or tenderness.     Cervical back: Neck supple. No tenderness.  Lymphadenopathy:     Cervical: No cervical adenopathy.  Skin:    Findings: No erythema or rash.  Neurological:     Mental Status: She is alert.  Psychiatric:        Mood and Affect: Mood normal.        Behavior: Behavior  normal.         Outpatient Encounter Medications as of 04/12/2023  Medication Sig   montelukast (SINGULAIR) 10 MG tablet Take 10 mg by mouth at bedtime.   acetaZOLAMIDE (DIAMOX) 250 MG tablet Take 250 mg by mouth 4 (four) times daily.   albuterol (VENTOLIN HFA) 108 (90 Base) MCG/ACT inhaler Inhale 2 puffs into the lungs every 6 (six) hours as needed for wheezing or shortness of breath.   cetirizine (ZYRTEC) 10 MG tablet Take 10 mg by mouth daily.   EPINEPHrine 0.3 mg/0.3 mL IJ SOAJ injection    esomeprazole (NEXIUM) 40 MG capsule TAKE 1 CAPSULE BY MOUTH TWICE DAILY BEFORE MEALS   gentamicin ointment (GARAMYCIN) 0.1 % Apply topically 3 (three) times daily.   meloxicam (MOBIC) 15 MG tablet TAKE 1 TABLET(15 MG) BY MOUTH DAILY   mupirocin ointment (BACTROBAN) 2 % Apply 1 Application topically 2 (two) times daily.   pseudoephedrine (SUDAFED) 120 MG 12 hr tablet Take 120 mg by mouth daily.   sertraline (ZOLOFT) 100 MG tablet TAKE 1 TABLET(100 MG) BY MOUTH DAILY   traZODone (DESYREL) 50 MG tablet Take 50-100 mg by mouth at bedtime as needed.   Facility-Administered Encounter Medications as of 04/12/2023  Medication   betamethasone acetate-betamethasone sodium phosphate (CELESTONE) injection 3 mg   betamethasone acetate-betamethasone sodium phosphate (CELESTONE) injection 3 mg   betamethasone acetate-betamethasone sodium phosphate (CELESTONE) injection 3 mg     Lab Results  Component Value Date   WBC 9.8 04/04/2023   HGB 13.2 04/04/2023   HCT 40.1 04/04/2023   PLT 253.0 04/04/2023   GLUCOSE 98 04/04/2023   CHOL 207 (H) 07/07/2022   TRIG 112.0  07/07/2022   HDL 41.10 07/07/2022   LDLCALC 143 (H) 07/07/2022   ALT 14 01/25/2023   AST 19 01/25/2023   NA 136 04/04/2023   K 3.7 04/04/2023   CL 107 04/04/2023   CREATININE 0.91 04/04/2023   BUN 27 (H) 04/04/2023   CO2 21 04/04/2023   TSH 2.17 07/07/2022   HGBA1C 5.7 03/05/2020    DG FL GUIDED LUMBAR PUNCTURE Result Date: 03/16/2023 CLINICAL DATA:  Patient is a 48 y/o female with history of migraines. Patient presents for a diagnostic and therapeutic lumbar puncture due to suspected idiopathic intracranial hypertension. EXAM: DIAGNOSTIC LUMBAR PUNCTURE UNDER FLUOROSCOPIC GUIDANCE FLUOROSCOPY TIME:  Radiation Exposure Index (as provided by the fluoroscopic device): 12.10 mGy If the device does not provide the exposure index: Fluoroscopy Time (in minutes and seconds): 000.8 min PROCEDURE: Informed consent was obtained from the patient prior to the procedure, including potential complications of headache, allergy, and pain. With the patient prone, the lower back was prepped with Betadine. 1% Lidocaine was used for local anesthesia. Lumbar puncture was performed at the L4-L5 level using a 20 gauge needle with return of clear CSF with an opening pressure of 21 cm water. 12 ml of CSF were obtained for laboratory studies. Closing pressure was 14. The patient tolerated the procedure well and there were no apparent complications. IMPRESSION: Successful lumbar puncture without immediate complications. Performed by Philipp Ovens PA-C Electronically Signed   By: Irish Lack M.D.   On: 03/16/2023 12:26       Assessment & Plan:  Anemia, unspecified type Assessment & Plan: Hgb 04/04/23 - wnl.    Difficulty sleeping Assessment & Plan: Continue cpap.    Environmental allergies Assessment & Plan: With congestion as outlined. Mucinex. Saline nasal spray. Consider steroid nasal spray. Taking singulair. Follow.    Other  fatigue Assessment & Plan: Persistent fatigue. Using cpap. Headaches  better. On acetazolamide. Lower dose now. Obtain ophthalmology records. Recent LP - opening pressures ok. Consider stopping after review of records. Recent labs ok. Stay hydrated. Unclear etiology. Discussed with neurology. They are scheduling f/u tomorrow to discuss.    Gastroesophageal reflux disease, unspecified whether esophagitis present Assessment & Plan: Controlled on PPI.    Acute nonintractable headache, unspecified headache type Assessment & Plan: Headache resolved. Still not back to normal. Feels can't focus. Seeing ophthalmology. Referred to neuro ophthalmology. Plans to call for f/u.    Hypercholesterolemia Assessment & Plan: Low cholesterol diet and exercise.  Follow lipid panel.    Hyperglycemia Assessment & Plan: Low carb diet and exercise.  Follow met b and a1c.    Sleep apnea, unspecified type Assessment & Plan: Continue cpap. Using regularly.       Dale Garden, MD

## 2023-04-16 ENCOUNTER — Encounter: Payer: Self-pay | Admitting: Internal Medicine

## 2023-04-16 NOTE — Assessment & Plan Note (Signed)
 Headache resolved. Still not back to normal. Feels can't focus. Seeing ophthalmology. Referred to neuro ophthalmology. Plans to call for f/u.

## 2023-04-16 NOTE — Assessment & Plan Note (Signed)
 Persistent fatigue. Using cpap. Headaches better. On acetazolamide. Lower dose now. Obtain ophthalmology records. Recent LP - opening pressures ok. Consider stopping after review of records. Recent labs ok. Stay hydrated. Unclear etiology. Discussed with neurology. They are scheduling f/u tomorrow to discuss.

## 2023-04-16 NOTE — Assessment & Plan Note (Signed)
 Low carb diet and exercise.  Follow met b and a1c.

## 2023-04-16 NOTE — Assessment & Plan Note (Signed)
 Continue cpap.

## 2023-04-16 NOTE — Assessment & Plan Note (Signed)
 Low cholesterol diet and exercise.  Follow lipid panel.

## 2023-04-16 NOTE — Assessment & Plan Note (Addendum)
 With congestion as outlined. Mucinex. Saline nasal spray. Consider steroid nasal spray. Taking singulair. Follow.

## 2023-04-16 NOTE — Assessment & Plan Note (Signed)
Continue cpap.  Using regularly.  

## 2023-04-16 NOTE — Assessment & Plan Note (Signed)
 Hgb 04/04/23 - wnl.

## 2023-04-16 NOTE — Assessment & Plan Note (Signed)
 Controlled on PPI

## 2023-04-20 ENCOUNTER — Ambulatory Visit: Admitting: Sleep Medicine

## 2023-04-20 ENCOUNTER — Encounter: Payer: Self-pay | Admitting: Sleep Medicine

## 2023-04-20 VITALS — BP 138/84 | HR 84 | Ht 70.0 in | Wt 262.0 lb

## 2023-04-20 DIAGNOSIS — Z6837 Body mass index (BMI) 37.0-37.9, adult: Secondary | ICD-10-CM | POA: Diagnosis not present

## 2023-04-20 DIAGNOSIS — F419 Anxiety disorder, unspecified: Secondary | ICD-10-CM

## 2023-04-20 DIAGNOSIS — E669 Obesity, unspecified: Secondary | ICD-10-CM

## 2023-04-20 DIAGNOSIS — G4733 Obstructive sleep apnea (adult) (pediatric): Secondary | ICD-10-CM | POA: Diagnosis not present

## 2023-04-20 NOTE — Progress Notes (Signed)
 Name:Melissa Higgins MRN: [id number] DOB: 1975/11/30   CHIEF COMPLAINT:  CPAP F/U   HISTORY OF PRESENT ILLNESS:  Melissa Higgins is a 48 y.o. w/ a h/o OSA, obesity, GERD, anxiety who presents for CPAP follow up visit. Reports decreased CPAP usage recently. Also admits to severe fatigue and headaches recently. States that she was using the Simplus FFM. No issues with mask leaks or dry mouth.   EPWORTH SLEEP SCORE    03/08/2018    4:00 PM  Results of the Epworth flowsheet  Sitting and reading 1  Watching TV 0  Sitting, inactive in a public place (e.g. a theatre or a meeting) 0  As a passenger in a car for an hour without a break 2  Lying down to rest in the afternoon when circumstances permit 2  Sitting and talking to someone 0  Sitting quietly after a lunch without alcohol 0  In a car, while stopped for a few minutes in traffic 0  Total score 5    PAST MEDICAL HISTORY :   has a past medical history of C. difficile colitis, Depression, Dysmenorrhea, Environmental allergies, History of frequent urinary tract infections, Hypercholesterolemia, and Sleep apnea.  has a past surgical history that includes Nasal sinus surgery (2003); Uterine fibroid surgery (2019); and Colonoscopy with propofol (N/A, 01/18/2019). Prior to Admission medications   Medication Sig Start Date End Date Taking? Authorizing Provider  albuterol (VENTOLIN HFA) 108 (90 Base) MCG/ACT inhaler Inhale 2 puffs into the lungs every 6 (six) hours as needed for wheezing or shortness of breath. 10/28/22  Yes Dale Frederick, MD  cetirizine (ZYRTEC) 10 MG tablet Take 10 mg by mouth daily.   Yes [provider]  EPINEPHrine 0.3 mg/0.3 mL IJ SOAJ injection  07/06/15  Yes [provider]  esomeprazole (NEXIUM) 40 MG capsule TAKE 1 CAPSULE BY MOUTH TWICE DAILY BEFORE MEALS 05/13/22  Yes Dale Fabrica, MD  gentamicin ointment (GARAMYCIN) 0.1 % Apply topically 3 (three) times daily. 12/22/21  Yes [provider]  meloxicam (MOBIC) 15 MG tablet TAKE 1 TABLET(15 MG) BY MOUTH DAILY 11/14/22  Yes Felecia Shelling, DPM  montelukast (SINGULAIR) 10 MG tablet Take 10 mg by mouth at bedtime. 04/07/23  Yes [provider]  mupirocin ointment (BACTROBAN) 2 % Apply 1 Application topically 2 (two) times daily. 12/02/22  Yes Dale [place], MD  pseudoephedrine (SUDAFED) 120 MG 12 hr tablet Take 120 mg by mouth daily.   Yes [provider]  sertraline (ZOLOFT) 100 MG tablet TAKE 1 TABLET(100 MG) BY MOUTH DAILY 01/13/20  Yes Dale Harrison, MD  traZODone (DESYREL) 50 MG tablet Take 50-100 mg by mouth at bedtime as needed. 04/21/20  Yes [provider]   Allergies  Allergen Reactions   Augmentin [Amoxicillin-Pot Clavulanate] Diarrhea    FAMILY HISTORY:  family history includes Allergies in her father; Asthma in her father; Brain cancer (age of onset: 40) in her father; Hyperlipidemia in her father and mother; Migraines in her maternal aunt. SOCIAL HISTORY:  reports that she has never smoked. She has never used smokeless tobacco. She reports current alcohol use. She reports that she does not use drugs.   Review of Systems:  Gen:  Denies  fever, sweats, chills weight loss  HEENT: Denies blurred vision, double vision, ear pain, eye pain, hearing loss, nose bleeds, sore throat Cardiac:  No dizziness, chest pain or heaviness, chest tightness,edema, No JVD Resp:   No cough, -sputum production, -shortness  of breath,-wheezing, -hemoptysis,  Gi: Denies swallowing difficulty, stomach pain, nausea or vomiting, diarrhea, constipation, bowel incontinence Gu:  Denies bladder incontinence, burning urine Ext:   Denies Joint pain, stiffness or swelling Skin: Denies  skin rash, easy bruising or bleeding or hives Endoc:  Denies polyuria, polydipsia , polyphagia or weight change Psych:   Denies depression, insomnia or hallucinations  Other:  All other systems negative  VITAL SIGNS: BP  138/84   Pulse 84   Ht 5\' 10"  (1.778 m)   Wt 262 lb (118.8 kg)   SpO2 97%   BMI 37.59 kg/m    Physical Examination:   General Appearance: No distress  EYES PERRLA, EOM intact.   NECK Supple, No JVD Pulmonary: normal breath sounds, No wheezing.  CardiovascularNormal S1,S2.  No m/r/g.   Abdomen: Benign, Soft, non-tender. Skin:   warm, no rashes, no ecchymosis  Extremities: normal, no cyanosis, clubbing. Neuro:without focal findings,  speech normal  PSYCHIATRIC: Mood, affect within normal limits.   ASSESSMENT AND PLAN  OSA Counseled patient on increasing CPAP compliance. I suspect that OSA is likely present due to clinical presentation. Discussed the consequences of untreated sleep apnea. Advised not to drive drowsy for safety of patient and others. Will follow up to review CPAP efficacy and compliance data.     Anxiety Stable, on current management. Following with PCP.   Obesity Counseled patient on diet and lifestyle modification.    Patient satisfied with Plan of action and management. All questions answered  I spent a total of 31 minutes reviewing chart data, face-to-face evaluation with the patient, counseling and coordination of care as detailed above.    Tempie Hoist, M.D.  Sleep Medicine [facility]

## 2023-04-20 NOTE — Patient Instructions (Signed)

## 2023-04-30 ENCOUNTER — Encounter: Payer: Self-pay | Admitting: Internal Medicine

## 2023-05-01 NOTE — Telephone Encounter (Signed)
 See me before calling her. Please call her and notify her that elevated levels mean that she is has been exposed to these viruses previously.

## 2023-05-02 NOTE — Telephone Encounter (Signed)
 Patient aware of below.

## 2023-05-05 ENCOUNTER — Ambulatory Visit (INDEPENDENT_AMBULATORY_CARE_PROVIDER_SITE_OTHER)

## 2023-05-05 ENCOUNTER — Ambulatory Visit: Payer: Self-pay | Admitting: Podiatry

## 2023-05-05 ENCOUNTER — Encounter: Payer: Self-pay | Admitting: Podiatry

## 2023-05-05 DIAGNOSIS — D2371 Other benign neoplasm of skin of right lower limb, including hip: Secondary | ICD-10-CM | POA: Diagnosis not present

## 2023-05-05 DIAGNOSIS — M722 Plantar fascial fibromatosis: Secondary | ICD-10-CM

## 2023-05-05 MED ORDER — BETAMETHASONE SOD PHOS & ACET 6 (3-3) MG/ML IJ SUSP
3.0000 mg | Freq: Once | INTRAMUSCULAR | Status: AC
  Administered 2023-05-05: 3 mg via INTRA_ARTICULAR

## 2023-05-05 MED ORDER — MELOXICAM 15 MG PO TABS: 15.0000 mg | ORAL_TABLET | Freq: Every day | ORAL | 1 refills | Status: DC

## 2023-05-05 MED ORDER — METHYLPREDNISOLONE 4 MG PO TBPK: ORAL_TABLET | ORAL | 0 refills | Status: DC

## 2023-05-05 NOTE — Progress Notes (Signed)
   Chief Complaint  Patient presents with   Foot Pain    "I have a sore spot on my left heel.  I have a sore spot on the side of my right foot." N - heel pain  L - left plantar D - 1 month O - suddenly, gradually worse C - tender A - walking, standing, pressure, barefoot T - mindful of shoes I wear  N - side of foot hurts L - 5th met right D - 2-3 weeks O - suddenly C - tender A - when shoes rub T - none     Subjective: 48 y.o. female    Past Medical History:  Diagnosis Date   C. difficile colitis    augment use   Depression    Dysmenorrhea    Environmental allergies    allergy shots   History of frequent urinary tract infections    Hypercholesterolemia    Sleep apnea    CPAP used at home     Objective: Physical Exam General: The patient is alert and oriented x3 in no acute distress.  Dermatology: Skin is warm, dry and supple bilateral lower extremities. Eccrine poroma left foot fifth MTP with tenderness to palpation  Vascular: Dorsalis Pedis and Posterior Tibial pulses palpable bilateral.  Capillary fill time is immediate to all digits.  Neurological: Grossly intact via light touch  Musculoskeletal: Tenderness to palpation to the plantar aspect of the left heel along the plantar fascia. All other joints range of motion within normal limits bilateral. Strength 5/5 in all groups bilateral.   Radiographic exam LT foot 05/05/2023: Normal osseous mineralization. Joint spaces preserved. No fracture/dislocation/boney destruction. No other soft tissue abnormalities or radiopaque foreign bodies.   Assessment: 1. 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 Xrays reviewed.   -Injection of 0.5cc Celestone  soluspan injected into the left plantar fascia.  -Rx for Medrol  Dose Pak placed -Rx for Meloxicam  ordered for patient. -Continue wearing good supportive tennis shoes and sneakers.  Advised against going  barefoot -Excisional debridement of the hyperkeratotic skin lesion to the plantar aspect of the fifth MTP of the right foot performed today using a 312 scalpel without incident.  Salicylic acid applied -Recommend OTC salicylic acid as needed -Return to clinic as needed    Dot Gazella, DPM [facility]  Dr. Dot Gazella, DPM    [address] [place], [place] [address]                Office [phone]  Fax [phone]

## 2023-05-18 ENCOUNTER — Encounter: Payer: Self-pay | Admitting: Dermatology

## 2023-05-18 ENCOUNTER — Ambulatory Visit (INDEPENDENT_AMBULATORY_CARE_PROVIDER_SITE_OTHER): Payer: Self-pay | Admitting: Dermatology

## 2023-05-18 DIAGNOSIS — L918 Other hypertrophic disorders of the skin: Secondary | ICD-10-CM

## 2023-05-18 NOTE — Progress Notes (Signed)
   Follow-Up Visit   Subjective  Melissa Higgins is a 48 y.o. female who presents for the following: skin tags. Three at bra line. Two at inner thigh area. Rubbed and irritated. Would like them removed.     The following portions of the chart were reviewed this encounter and updated as appropriate: medications, allergies, medical history  Review of Systems:  No other skin or systemic complaints except as noted in HPI or Assessment and Plan.  Objective  Well appearing patient in no apparent distress; mood and affect are within normal limits.  A focused examination was performed of the following areas: Chest/abdomen, inner thighs  Relevant physical exam findings are noted in the Assessment and Plan.    Assessment & Plan   ACROCHORDONS (Skin Tags) - Removal desired by patient - Fleshy, skin-colored pedunculated papules on R axilla, R inframammary, R prox ant thigh, L prox medial thigh - Benign appearing.  - Patient desires removal. Reviewed that this is not covered by insurance and they will be charged a cosmetic fee for removal. Patient signed non-covered consent.  Procedure Note: - Prior to the procedure, reviewed the expected small wound. Also reviewed the risk of leaving a small scar and the small risk of infection.  PROCEDURE - The areas were prepped with isopropyl alcohol. A small amount of lidocaine  1% with epinephrine was injected at the base of each lesion to achieve good local anesthesia. The skin tags were removed using a snip technique. Aluminum chloride was used for hemostasis. Petrolatum and a bandage were applied. The procedure was tolerated well. - Wound care was reviewed with the patient. They were advised to call with any concerns.  - Locations: R axilla x1, R inferior breast x2, R ant. thigh x1, L medial thigh x1 - Total number of treated acrochordons 5   - patient agrees to pay cosmetic charge $115. Do not bill insurance ACROCHORDON     Return if symptoms  worsen or fail to improve.  I, Jill Parcell, CMA, am acting as scribe for Harris Liming, MD.   Documentation: I have reviewed the above documentation for accuracy and completeness, and I agree with the above.  Harris Liming, MD

## 2023-05-18 NOTE — Patient Instructions (Signed)
 Wash areas daily with soap and water. Apply Vaseline Jelly and band-aid until healed.     Due to recent changes in healthcare laws, you may see results of your pathology and/or laboratory studies on MyChart before the doctors have had a chance to review them. We understand that in some cases there may be results that are confusing or concerning to you. Please understand that not all results are received at the same time and often the doctors may need to interpret multiple results in order to provide you with the best plan of care or course of treatment. Therefore, we ask that you please give us  2 business days to thoroughly review all your results before contacting the office for clarification. Should we see a critical lab result, you will be contacted sooner.   If You Need Anything After Your Visit  If you have any questions or concerns for your doctor, please call our main line at [phone] and press option 4 to reach your doctor's medical assistant. If no one answers, please leave a voicemail as directed and we will return your call as soon as possible. Messages left after 4 pm will be answered the following business day.   You may also send us  a message via MyChart. We typically respond to MyChart messages within 1-2 business days.  For prescription refills, please ask your pharmacy to contact our office. Our fax number is [phone].  If you have an urgent issue when the clinic is closed that cannot wait until the next business day, you can page your doctor at the number below.    Please note that while we do our best to be available for urgent issues outside of office hours, we are not available 24/7.   If you have an urgent issue and are unable to reach us , you may choose to seek medical care at your doctor's office, retail clinic, urgent care center, or emergency room.  If you have a medical emergency, please immediately call 911 or go to the emergency department.  Pager  Numbers  - Dr. Bary Likes: [phone]  - Dr. Annette Barters: [phone]  - Dr. Felipe Horton: [phone]   In the event of inclement weather, please call our main line at [phone] for an update on the status of any delays or closures.  Dermatology Medication Tips: Please keep the boxes that topical medications come in in order to help keep track of the instructions about where and how to use these. Pharmacies typically print the medication instructions only on the boxes and not directly on the medication tubes.   If your medication is too expensive, please contact our office at [phone] option 4 or send us  a message through MyChart.   We are unable to tell what your co-pay for medications will be in advance as this is different depending on your insurance coverage. However, we may be able to find a substitute medication at lower cost or fill out paperwork to get insurance to cover a needed medication.   If a prior authorization is required to get your medication covered by your insurance company, please allow us  1-2 business days to complete this process.  Drug prices often vary depending on where the prescription is filled and some pharmacies may offer cheaper prices.  The website www.goodrx.com contains coupons for medications through different pharmacies. The prices here do not account for what the cost may be with help from insurance (it may be cheaper with your insurance), but the website can give you the price  if you did not use any insurance.  - You can print the associated coupon and take it with your prescription to the pharmacy.  - You may also stop by our office during regular business hours and pick up a GoodRx coupon card.  - If you need your prescription sent electronically to a different pharmacy, notify our office through [facility] or by phone at [phone] option 4.     Si Usted Necesita Algo Despus de Su Visita  Tambin puede enviarnos un mensaje a travs de  Clinical cytogeneticist. Por lo general respondemos a los mensajes de MyChart en el transcurso de 1 a 2 das hbiles.  Para renovar recetas, por favor pida a su farmacia que se ponga en contacto con nuestra oficina. Franz Jacks de fax es [place] [phone].  Si tiene un asunto urgente cuando la clnica est cerrada y que no puede esperar hasta el siguiente da hbil, puede llamar/localizar a su doctor(a) al nmero que aparece a continuacin.   Por favor, tenga en cuenta que aunque hacemos todo lo posible para estar disponibles para asuntos urgentes fuera del horario de [place], no estamos disponibles las 24 horas del da, los 7 [address]  [address] de la [place].   Si tiene un problema urgente y no puede comunicarse con nosotros, puede optar por buscar atencin mdica  en el consultorio de su doctor(a), en una clnica privada, en un centro de atencin urgente o en una sala de emergencias.  Si tiene Engineer, drilling, por favor llame inmediatamente al 911 o vaya a la sala de emergencias.  Nmeros de bper  - Dr. Bary Likes: [phone]  - Dra. Annette Barters: [phone]  - Dr. Felipe Horton: [phone]   En caso de inclemencias del tiempo, por favor llame a Lajuan Pila principal al [phone] para una actualizacin sobre el [place] de cualquier retraso o cierre.  Consejos para la medicacin en dermatologa: Por favor, guarde las cajas en las que vienen los medicamentos de uso tpico para ayudarle a seguir las instrucciones sobre dnde y cmo usarlos. Las farmacias generalmente imprimen las instrucciones del medicamento slo en las cajas y no directamente en los tubos del [place].   Si su medicamento es muy caro, por favor, pngase en contacto con Bettyjane Brunet llamando al [phone] y presione la opcin 4 o envenos un mensaje a travs de Clinical cytogeneticist.   No podemos decirle cul ser su copago por los medicamentos por adelantado ya que esto es diferente dependiendo de la cobertura de su seguro. Sin embargo, es posible que  podamos encontrar un medicamento sustituto a Audiological scientist un formulario para que el seguro cubra el medicamento que se considera necesario.   Si se requiere una autorizacin previa para que su compaa de seguros [place] su medicamento, por favor permtanos de 1 a 2 das hbiles para completar este proceso.  Los precios de los medicamentos varan con frecuencia dependiendo del Environmental consultant de dnde se surte la receta y alguna farmacias pueden ofrecer precios ms baratos.  El sitio web www.goodrx.com tiene cupones para medicamentos de Health and safety inspector. Los precios aqu no tienen en cuenta lo que podra costar con la ayuda del seguro (puede ser ms barato con su seguro), pero el sitio web puede darle el precio si no utiliz Tourist information centre manager.  - Puede imprimir el cupn correspondiente y llevarlo con su receta a la farmacia.  - Tambin puede pasar por nuestra oficina durante el horario de atencin regular y Education officer, museum una tarjeta de cupones de GoodRx.  - Si necesita que  su receta se enve electrnicamente a una farmacia diferente, informe a nuestra oficina a travs de MyChart de [place] o por telfono llamando al [phone] y presione la opcin 4.

## 2023-06-06 ENCOUNTER — Telehealth (INDEPENDENT_AMBULATORY_CARE_PROVIDER_SITE_OTHER): Payer: Self-pay | Admitting: Otolaryngology

## 2023-06-06 NOTE — Telephone Encounter (Signed)
 LVM to confirm appt & location [id number] afm

## 2023-06-07 ENCOUNTER — Ambulatory Visit (INDEPENDENT_AMBULATORY_CARE_PROVIDER_SITE_OTHER): Admitting: Otolaryngology

## 2023-06-07 VITALS — BP 121/80 | HR 71

## 2023-06-07 DIAGNOSIS — R0981 Nasal congestion: Secondary | ICD-10-CM

## 2023-06-07 DIAGNOSIS — R0982 Postnasal drip: Secondary | ICD-10-CM

## 2023-06-07 DIAGNOSIS — G4733 Obstructive sleep apnea (adult) (pediatric): Secondary | ICD-10-CM

## 2023-06-07 DIAGNOSIS — J3089 Other allergic rhinitis: Secondary | ICD-10-CM

## 2023-06-07 DIAGNOSIS — Z789 Other specified health status: Secondary | ICD-10-CM

## 2023-06-07 NOTE — Progress Notes (Signed)
 ENT CONSULT:  Reason for Consult: OSA CPAP intolerance    HPI: Discussed the use of AI scribe software for clinical note transcription with the patient, who gave verbal consent to proceed.  History of Present Illness Melissa Higgins is a 48 year old female with mild obstructive sleep apnea diagnosed in 2020 (AHI of 12) who presents for evaluation of Inspire implant candidacy. She is currently followed by Dr. Thomasina Fletcher, a pulmonary specialist, for her sleep apnea management.  She has a history of obstructive sleep apnea diagnosed in 2020 and is currently using a CPAP machine. She finds the CPAP machine uncomfortable, particularly due to the full face mask, although it works best for her out of all of the mask types she has had over the years. She has tried various masks but experiences discomfort and issues with water leaking from the mask. Her CPAP machine is the original one she received in 2020.  Her apnea-hypopnea index (AHI) from a home sleep test was 6.3 in 2024, but she recalls a previous AHI of 12.8 from a 2020 test, which was also conducted in a lab setting. No history of heart or lung disease and insomnia. Occasionally experiences sinus issues but does not attribute her CPAP discomfort to nasal congestion.  Records Reviewed:  Dr Thomasina Fletcher Melissa Higgins is a 48 y.o. w/ a h/o OSA, obesity, GERD, anxiety who presents for CPAP follow up visit. Reports decreased CPAP usage recently. Also admits to severe fatigue and headaches recently. States that she was using the Simplus FFM. No issues with mask leaks or dry mouth.   OSA Counseled patient on increasing CPAP compliance. I suspect that OSA is likely present due to clinical presentation. Discussed the consequences of untreated sleep apnea. Advised not to drive drowsy for safety of patient and others. Will follow up to review CPAP efficacy and compliance data.      Anxiety Stable, on current management. Following with PCP.     Obesity Counseled patient on diet and lifestyle modification.      Past Medical History:  Diagnosis Date   C. difficile colitis    augment use   Depression    Dysmenorrhea    Environmental allergies    allergy shots   History of frequent urinary tract infections    Hypercholesterolemia    Sleep apnea    CPAP used at home    Past Surgical History:  Procedure Laterality Date   COLONOSCOPY WITH PROPOFOL  N/A 01/18/2019   Procedure: COLONOSCOPY WITH PROPOFOL ;  Surgeon: Luke Salaam, MD;  Location: [facility] ENDOSCOPY;  Service: Gastroenterology;  Laterality: N/A;   NASAL SINUS SURGERY  2003   UTERINE FIBROID SURGERY  2019    Family History  Problem Relation Age of Onset   Hyperlipidemia Mother    Asthma Father    Hyperlipidemia Father    Allergies Father    Brain cancer Father 37   Migraines Maternal Aunt    Breast cancer Neg Hx     Social History:  reports that she has never smoked. She has never used smokeless tobacco. She reports that she does not currently use alcohol. She reports that she does not use drugs.  Allergies:  Allergies  Allergen Reactions   Augmentin [Amoxicillin-Pot Clavulanate] Diarrhea    Medications: I have reviewed the patient's current medications.  The PMH, PSH, Medications, Allergies, and SH were reviewed and updated.  ROS: Constitutional: Negative for fever, weight loss and weight gain. Cardiovascular: Negative for chest pain and dyspnea on  exertion. Respiratory: Is not experiencing shortness of breath at rest. Gastrointestinal: Negative for nausea and vomiting. Neurological: Negative for headaches. Psychiatric: The patient is not nervous/anxious  There were no vitals taken for this visit. There is no height or weight on file to calculate BMI.  PHYSICAL EXAM:  Exam: General: Well-developed, well-nourished Communication and Voice: Clear pitch and clarity Respiratory Respiratory effort: Equal inspiration and expiration without  stridor Cardiovascular Peripheral Vascular: Warm extremities with equal color/perfusion Eyes: No nystagmus with equal extraocular motion bilaterally Neuro/Psych/Balance: Patient oriented to person, place, and time; Appropriate mood and affect; Gait is intact with no imbalance; Cranial nerves I-XII are intact Head and Face Inspection: Normocephalic and atraumatic without mass or lesion Palpation: Facial skeleton intact without bony stepoffs Salivary Glands: No mass or tenderness Facial Strength: Facial motility symmetric and full bilaterally ENT Pinna: External ear intact and fully developed External canal: Canal is patent with intact skin Tympanic Membrane: Clear and mobile External Nose: No scar or anatomic deformity Internal Nose: Septum is relatively straight.  Lips, Teeth, and gums: Mucosa and teeth intact and viable TMJ: No pain to palpation with full mobility Oral cavity/oropharynx: No erythema or exudate, no lesions present Friedman IV 1+ tonsils b/l  Neck Neck and Trachea: Midline trachea without mass or lesion Thyroid : No mass or nodularity Lymphatics: No lymphadenopathy   Studies Reviewed: HST done 03/22/22 BMI 35.6 AHI of 6.3 Few central apneas during study  SpO2 nadir 85%  AHI of 12.8 on In-lab sleep study in 2020  Assessment/Plan: Encounter Diagnoses  Name Primary?   OSA (obstructive sleep apnea) Yes   Intolerance of continuous positive airway pressure (CPAP) ventilation    Chronic nasal congestion    Post-nasal drip    Environmental and seasonal allergies     Assessment and Plan Assessment & Plan Obstructive Sleep Apnea (OSA) mild  OSA diagnosed in 2020 with AHI 12.8, not qualifying for Inspire implant. Current AHI 6.3, mild OSA based on home study 2024. CPAP used, mask discomfort noted. No nasal congestion. Discussed in-lab sleep study to reassess severity. Explained Inspire implant process and contraindications. Discussed alternative treatments. - Arrange  in-lab sleep study with Dr. Reddy's team. - Provided information on the [facility]. - Discussed alternative treatments with primary care, including weight loss and tirzepatide. - Consider oral appliance therapy with oral surgery.  RTC after in-lab sleep study with Dr Michel Agreste     Thank you for allowing me to participate in the care of this patient. Please do not hesitate to contact me with any questions or concerns.   Artice Last, MD Otolaryngology [facility] Phone: [phone] Fax: [phone]    06/07/2023, 3:22 PM

## 2023-06-07 NOTE — Patient Instructions (Signed)
  It was very nice to meet you today,   Please see the following link for the [facility] program   [email or web address]  This website has information about how you can connect to other patients who have had Inspire Implant procedure

## 2023-07-08 ENCOUNTER — Other Ambulatory Visit: Payer: Self-pay | Admitting: Internal Medicine

## 2023-07-18 ENCOUNTER — Ambulatory Visit (INDEPENDENT_AMBULATORY_CARE_PROVIDER_SITE_OTHER): Admitting: Internal Medicine

## 2023-07-18 ENCOUNTER — Encounter: Payer: Self-pay | Admitting: Internal Medicine

## 2023-07-18 ENCOUNTER — Encounter: Admitting: Internal Medicine

## 2023-07-18 VITALS — BP 118/74 | HR 76 | Temp 98.4°F | Resp 20 | Ht 69.25 in | Wt 266.2 lb

## 2023-07-18 DIAGNOSIS — Z1231 Encounter for screening mammogram for malignant neoplasm of breast: Secondary | ICD-10-CM | POA: Diagnosis not present

## 2023-07-18 DIAGNOSIS — Z713 Dietary counseling and surveillance: Secondary | ICD-10-CM

## 2023-07-18 DIAGNOSIS — F439 Reaction to severe stress, unspecified: Secondary | ICD-10-CM

## 2023-07-18 DIAGNOSIS — R5383 Other fatigue: Secondary | ICD-10-CM

## 2023-07-18 DIAGNOSIS — R519 Headache, unspecified: Secondary | ICD-10-CM

## 2023-07-18 DIAGNOSIS — Z0000 Encounter for general adult medical examination without abnormal findings: Secondary | ICD-10-CM

## 2023-07-18 DIAGNOSIS — D649 Anemia, unspecified: Secondary | ICD-10-CM | POA: Diagnosis not present

## 2023-07-18 DIAGNOSIS — E7800 Pure hypercholesterolemia, unspecified: Secondary | ICD-10-CM

## 2023-07-18 DIAGNOSIS — R739 Hyperglycemia, unspecified: Secondary | ICD-10-CM

## 2023-07-18 DIAGNOSIS — G4730 Sleep apnea, unspecified: Secondary | ICD-10-CM

## 2023-07-18 DIAGNOSIS — M545 Low back pain, unspecified: Secondary | ICD-10-CM

## 2023-07-18 DIAGNOSIS — K219 Gastro-esophageal reflux disease without esophagitis: Secondary | ICD-10-CM

## 2023-07-18 NOTE — Assessment & Plan Note (Signed)
 Physical today 07/18/23.  Mammogram 07/07/22 - Birads I. Schedule f/u mammogram.  PAP 05/14/21 - negative with negative HPV. Had colonoscopy 2021.  Recommended f/u in 10 years.

## 2023-07-18 NOTE — Progress Notes (Signed)
 Subjective:    Patient ID: Melissa Higgins, female    DOB: 1975-04-14, 48 y.o.   MRN: [id number]  Patient here for  Chief Complaint  Patient presents with   Annual Exam    HPI Here for a physical exam. Saw neurology 07/12/23 - evaluation of headaches. Diagnosed - intractable migraine. Recommended referral to neuro-ophthalmology for optic nerve elevation evaluation.  Also recommended to start topamax (increased to 50mg  07/27/23) and imitrex. Also use cpap and avoid possible triggers. Seeing orho for back pain. 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 S/p epidural injection. Evaluated 06/07/23 - for possible Inspire. Was not a candidate. Seeing podiatry - plantar fasciitis - left foot. Evaluated 4/25 - medrol  dose pak, meloxicam . S/p injection - celestone . Reports that she is still having headaches, but not occurring as frequent. Not as intense. Still with decreased energy. Decreased motivation. Using cpap regularly. LMP one month ago. Taking iron supplements. Had f/u with Dr Chipper - 05/2023. Discussed referral - weight management.    Past Medical History:  Diagnosis Date   Allergy    Anxiety    C. difficile colitis    augment use   Depression    Dysmenorrhea    Environmental allergies    allergy shots   GERD (gastroesophageal reflux disease)    History of frequent urinary tract infections    Hypercholesterolemia    Sleep apnea    CPAP used at home   Past Surgical History:  Procedure Laterality Date   COLONOSCOPY WITH PROPOFOL  N/A 01/18/2019   Procedure: COLONOSCOPY WITH PROPOFOL ;  Surgeon: Therisa Bi, MD;  Location: [facility] ENDOSCOPY;  Service: Gastroenterology;  Laterality: N/A;   NASAL SINUS SURGERY  2003   UTERINE FIBROID SURGERY  2019   Family History  Problem Relation Age of Onset   Hyperlipidemia Mother    Arthritis Mother    Asthma Father    Hyperlipidemia Father    Allergies Father    Brain cancer Father 77   Cancer Father    Cancer Paternal Grandmother    Cancer Paternal  Grandfather    Migraines Maternal Aunt    Depression Paternal Aunt    Breast cancer Neg Hx    Social History   Socioeconomic History   Marital status: Divorced    Spouse name: Not on file   Number of children: 0   Years of education: college   Highest education level: Master's degree (e.g., MA, MS, MEng, MEd, MSW, MBA)  Occupational History   Not on file  Tobacco Use   Smoking status: Never   Smokeless tobacco: Never  Vaping Use   Vaping status: Never Used  Substance and Sexual Activity   Alcohol use: Not Currently    Comment: occasional   Drug use: No   Sexual activity: Yes    Birth control/protection: None  Other Topics Concern   Not on file  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   Social Drivers of Corporate investment banker Strain: Low Risk  (02/27/2023)   Received from [facility]   Overall Financial Resource Strain (CARDIA)    Difficulty of Paying Living Expenses: Not hard at all  Food Insecurity: No Food Insecurity (02/27/2023)   Received from [facility]   Hunger Vital Sign    Within the past 12 months, you worried that your food would run out before you got the money to buy more.: Never true  Within the past 12 months, the food you bought just didn't last and you didn't have money to get more.: Never true  Transportation Needs: No Transportation Needs (02/27/2023)   Received from [facility] - Transportation    In the past 12 months, has lack of transportation kept you from medical appointments or from getting medications?: No    Lack of Transportation (Non-Medical): No  Physical Activity: Inactive (01/08/2023)   Exercise Vital Sign    Days of Exercise per Week: 0 days    Minutes of Exercise per Session: 20 min  Stress: No Stress Concern Present (01/08/2023)   [employer] of Occupational Health - Occupational Stress Questionnaire     Feeling of Stress : Only a little  Social Connections: Socially Isolated (01/08/2023)   Social Connection and Isolation Panel    Frequency of Communication with Friends and Family: More than three times a week    Frequency of Social Gatherings with Friends and Family: Once a week    Attends Religious Services: Never    Database administrator or Organizations: No    Attends Engineer, structural: Not on file    Marital Status: Divorced     Review of Systems  Constitutional:  Positive for fatigue. Negative for appetite change and unexpected weight change.  HENT:  Negative for congestion and sinus pressure.   Respiratory:  Negative for cough, chest tightness and shortness of breath.   Cardiovascular:  Negative for chest pain and palpitations.  Gastrointestinal:  Negative for abdominal pain, diarrhea, nausea and vomiting.  Genitourinary:  Negative for difficulty urinating and dysuria.  Musculoskeletal:  Negative for joint swelling and myalgias.  Skin:  Negative for color change and rash.  Neurological:  Negative for dizziness and headaches.  Psychiatric/Behavioral:  Negative for agitation.        Increased stress - frustration related to her current persistent medical issues.        Objective:     BP 118/74   Pulse 76   Temp 98.4 F (36.9 C)   Resp 20   Ht 5' 9.25 (1.759 m)   Wt 266 lb 4 oz (120.8 kg)   LMP 06/19/2023   SpO2 97%   BMI 39.03 kg/m  Wt Readings from Last 3 Encounters:  07/18/23 266 lb 4 oz (120.8 kg)  04/20/23 262 lb (118.8 kg)  04/12/23 260 lb (117.9 kg)    Physical Exam Vitals reviewed.  Constitutional:      General: She is not in acute distress.    Appearance: Normal appearance.  HENT:     Head: Normocephalic and atraumatic.     Right Ear: External ear normal.     Left Ear: External ear normal.     Mouth/Throat:     Pharynx: No oropharyngeal exudate or posterior oropharyngeal erythema.  Eyes:     General: No scleral icterus.        Right eye: No discharge.        Left eye: No discharge.     Conjunctiva/sclera: Conjunctivae normal.  Neck:     Thyroid : No thyromegaly.  Cardiovascular:     Rate and Rhythm: Normal rate and regular rhythm.  Pulmonary:     Effort: No respiratory distress.     Breath sounds: Normal breath sounds. No wheezing.  Abdominal:     General: Bowel sounds are normal.     Palpations: Abdomen is soft.     Tenderness: There is no abdominal tenderness.  Musculoskeletal:        General: No swelling or tenderness.     Cervical back: Neck supple. No tenderness.  Lymphadenopathy:     Cervical: No cervical adenopathy.  Skin:    Findings: No erythema or rash.  Neurological:     Mental Status: She is alert.  Psychiatric:        Mood and Affect: Mood normal.        Behavior: Behavior normal.         Outpatient Encounter Medications as of 07/18/2023  Medication Sig   albuterol  (VENTOLIN  HFA) 108 (90 Base) MCG/ACT inhaler Inhale 2 puffs into the lungs every 6 (six) hours as needed for wheezing or shortness of breath.   cetirizine (ZYRTEC) 10 MG tablet Take 10 mg by mouth daily.   EPINEPHrine 0.3 mg/0.3 mL IJ SOAJ injection    esomeprazole  (NEXIUM ) 40 MG capsule TAKE 1 CAPSULE BY MOUTH TWICE DAILY BEFORE MEALS   gentamicin ointment (GARAMYCIN) 0.1 % Apply topically 3 (three) times daily.   Iron-Vitamin C 65-125 MG TABS Take 1 tablet by mouth daily.   meloxicam  (MOBIC ) 15 MG tablet Take 1 tablet (15 mg total) by mouth daily.   mupirocin  ointment (BACTROBAN ) 2 % Apply 1 Application topically 2 (two) times daily.   pseudoephedrine (SUDAFED) 120 MG 12 hr tablet Take 120 mg by mouth daily.   Sertraline  HCl 150 MG CAPS Take 150 mg by mouth daily.   SUMAtriptan (IMITREX) 50 MG tablet Take 50 mg by mouth as needed.   topiramate (TOPAMAX) 25 MG tablet Take 25 mg by mouth daily. 2 weeks at 25 then 50 mg   traZODone  (DESYREL ) 50 MG tablet Take 50-100 mg by mouth at bedtime as needed. (Patient taking  differently: Take 50-150 mg by mouth at bedtime as needed for sleep.)   [DISCONTINUED] sertraline  (ZOLOFT ) 100 MG tablet TAKE 1 TABLET(100 MG) BY MOUTH DAILY   No facility-administered encounter medications on file as of 07/18/2023.     Lab Results  Component Value Date   WBC 9.8 04/04/2023   HGB 13.2 04/04/2023   HCT 40.1 04/04/2023   PLT 253.0 04/04/2023   GLUCOSE 98 04/04/2023   CHOL 207 (H) 07/07/2022   TRIG 112.0 07/07/2022   HDL 41.10 07/07/2022   LDLCALC 143 (H) 07/07/2022   ALT 14 01/25/2023   AST 19 01/25/2023   NA 136 04/04/2023   K 3.7 04/04/2023   CL 107 04/04/2023   CREATININE 0.91 04/04/2023   BUN 27 (H) 04/04/2023   CO2 21 04/04/2023   TSH 2.17 07/07/2022   HGBA1C 5.7 03/05/2020    DG FL GUIDED LUMBAR PUNCTURE Result Date: 03/16/2023 CLINICAL DATA:  Patient is a 48 y/o female with history of migraines. Patient presents for a diagnostic and therapeutic lumbar puncture due to suspected idiopathic intracranial hypertension. EXAM: DIAGNOSTIC LUMBAR PUNCTURE UNDER FLUOROSCOPIC GUIDANCE FLUOROSCOPY TIME:  Radiation Exposure Index (as provided by the fluoroscopic device): 12.10 mGy If the device does not provide the exposure index: Fluoroscopy Time (in minutes and seconds): 000.8 min PROCEDURE: Informed consent was obtained from the patient prior to the procedure, including potential complications of headache, allergy, and pain. With the patient prone, the lower back was prepped with Betadine. 1% Lidocaine  was used for local anesthesia. Lumbar puncture was performed at the L4-L5 level using a 20 gauge needle with return of clear CSF with an opening pressure of 21 cm water. 12 ml of CSF were obtained for laboratory studies. Closing pressure was 14.  The patient tolerated the procedure well and there were no apparent complications. IMPRESSION: Successful lumbar puncture without immediate complications. Performed by Daved Hipp PA-C Electronically Signed   By: Marcey Moan  M.D.   On: 03/16/2023 12:26       Assessment & Plan:  Routine general medical examination at a health care facility  Health care maintenance Assessment & Plan: Physical today 07/18/23.  Mammogram 07/07/22 - Birads I. Schedule f/u mammogram.  PAP 05/14/21 - negative with negative HPV. Had colonoscopy 2021.  Recommended f/u in 10 years.      Encounter for screening mammogram for malignant neoplasm of breast -     3D Screening Mammogram, Left and Right; Future  Weight loss counseling, encounter for Assessment & Plan: Refer to weight management program.   Orders: -     Amb Ref to Medical Weight Management  Anemia, unspecified type Assessment & Plan: Recheck cbc and iron studies.    Other fatigue Assessment & Plan: Persistent fatigue. Using cpap. Headaches are better, but still present. Check labs, including cbc and iron studies. Refer for weight management.    Gastroesophageal reflux disease, unspecified whether esophagitis present Assessment & Plan: Controlled on PPI.    Acute nonintractable headache, unspecified headache type Assessment & Plan: Saw neurology 07/12/23 - evaluation of headaches. Diagnosed - intractable migraine. Recommended referral to neuro-ophthalmology for optic nerve elevation evaluation.  Also recommended to start topamax (increased to 50mg  07/27/23) and imitrex. Also use cpap and avoid possible triggers. Seeing orho for back pain. 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 S/p ESI. Reports headaches not occurring as frequent. Not as intense. Continue f/u with neurology and planning appt with neuroopthalmology.    Hypercholesterolemia Assessment & Plan: Low cholesterol diet and exercise.  Follow lipid panel.    Hyperglycemia Assessment & Plan: Low carb diet and exercise.  Follow met b and a1c.    Stress Assessment & Plan:  Has been seeing Dr Chipper and counselor. Increased stress. Follow.    Sleep apnea, unspecified type Assessment & Plan: Continue cpap.     Low back pain, unspecified back pain laterality, unspecified chronicity, unspecified whether sciatica present Assessment & Plan:  Seeing orho for back pain. 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 S/p epidural injection. Continue f/u with ortho.       Allena Hamilton, MD

## 2023-07-19 ENCOUNTER — Encounter (INDEPENDENT_AMBULATORY_CARE_PROVIDER_SITE_OTHER): Payer: Self-pay

## 2023-07-23 ENCOUNTER — Encounter: Payer: Self-pay | Admitting: Internal Medicine

## 2023-07-23 NOTE — Assessment & Plan Note (Signed)
 Low cholesterol diet and exercise.  Follow lipid panel.

## 2023-07-23 NOTE — Assessment & Plan Note (Signed)
 Low carb diet and exercise.  Follow met b and a1c.

## 2023-07-23 NOTE — Assessment & Plan Note (Signed)
 Saw neurology 07/12/23 - evaluation of headaches. Diagnosed - intractable migraine. Recommended referral to neuro-ophthalmology for optic nerve elevation evaluation.  Also recommended to start topamax (increased to 50mg  07/27/23) and imitrex. Also use cpap and avoid possible triggers. Seeing orho for back pain. 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 S/p ESI. Reports headaches not occurring as frequent. Not as intense. Continue f/u with neurology and planning appt with neuroopthalmology.

## 2023-07-23 NOTE — Assessment & Plan Note (Signed)
 Controlled on PPI

## 2023-07-23 NOTE — Assessment & Plan Note (Signed)
Refer to weight management program.

## 2023-07-23 NOTE — Assessment & Plan Note (Signed)
 Seeing orho for back pain. 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 S/p epidural injection. Continue f/u with ortho.

## 2023-07-23 NOTE — Assessment & Plan Note (Signed)
 Has been seeing Dr Chipper and counselor. Increased stress. Follow.

## 2023-07-23 NOTE — Assessment & Plan Note (Signed)
 Persistent fatigue. Using cpap. Headaches are better, but still present. Check labs, including cbc and iron studies. Refer for weight management.

## 2023-07-23 NOTE — Assessment & Plan Note (Signed)
Recheck cbc and iron studies.  

## 2023-07-23 NOTE — Assessment & Plan Note (Signed)
 Continue cpap.

## 2023-08-09 ENCOUNTER — Ambulatory Visit
Admission: RE | Admit: 2023-08-09 | Discharge: 2023-08-09 | Disposition: A | Discharge status: Home / Self Care | Source: Ambulatory Visit | Attending: Internal Medicine | Admitting: Internal Medicine

## 2023-08-09 DIAGNOSIS — Z1231 Encounter for screening mammogram for malignant neoplasm of breast: Secondary | ICD-10-CM | POA: Insufficient documentation

## 2023-08-16 ENCOUNTER — Ambulatory Visit (INDEPENDENT_AMBULATORY_CARE_PROVIDER_SITE_OTHER): Admitting: Family Medicine

## 2023-08-16 ENCOUNTER — Encounter (INDEPENDENT_AMBULATORY_CARE_PROVIDER_SITE_OTHER): Payer: Self-pay | Admitting: Family Medicine

## 2023-08-16 VITALS — BP 134/82 | HR 67 | Temp 97.8°F | Ht 69.5 in | Wt 269.0 lb

## 2023-08-16 DIAGNOSIS — G4730 Sleep apnea, unspecified: Secondary | ICD-10-CM | POA: Diagnosis not present

## 2023-08-16 DIAGNOSIS — Z0289 Encounter for other administrative examinations: Secondary | ICD-10-CM

## 2023-08-16 DIAGNOSIS — K219 Gastro-esophageal reflux disease without esophagitis: Secondary | ICD-10-CM | POA: Diagnosis not present

## 2023-08-16 DIAGNOSIS — E669 Obesity, unspecified: Secondary | ICD-10-CM | POA: Diagnosis not present

## 2023-08-16 DIAGNOSIS — F39 Unspecified mood [affective] disorder: Secondary | ICD-10-CM | POA: Diagnosis not present

## 2023-08-16 DIAGNOSIS — Z6839 Body mass index (BMI) 39.0-39.9, adult: Secondary | ICD-10-CM

## 2023-08-16 NOTE — Progress Notes (Signed)
 Barnie DOROTHA Jenkins, DO, ABFM, ABOM Bariatric physician [address] [place], [place], [place] [address] Office: [phone]  /  Fax: [phone]     Initial Evaluation:  Melissa Higgins was seen in clinic today to evaluate for obesity. She is interested in losing weight to improve overall health and reduce the risk of weight related complications. She presents today to review program treatment options, initial physical assessment, and evaluation.     Pt is a Editor, commissioning at an elementary school. She currently lives alone. Reports her highest weight was 275 in 2022.    She was referred by: PCP - Dr. Glendia at [facility] in [place].   When asked what they hope to accomplish? She states: Improve quality of life and Looking for accountability.   When asked how has your weight affected you? She states: Contributed to medical problems, Contributed to orthopedic problems or mobility issues (back, hip, and sciatic pain), Having fatigue, and Has affected mood (anxiety/depression).   Contributing factors to her weight change: family history of obesity and Life event (divorce from 2nd marriage).  Some associated conditions: Hyperlipidemia, OSA, and GERD and migraines  Current nutrition plan: None  Current level of physical activity: NEAT  Current or previous pharmacotherapy: None  Response to medication: Never tried medications   Past Medical History:  Diagnosis Date   Allergy    Anxiety    C. difficile colitis    augment use   Depression    Dysmenorrhea    Environmental allergies    allergy shots   GERD (gastroesophageal reflux disease)    History of frequent urinary tract infections    Hypercholesterolemia    Sleep apnea    CPAP used at home    Current Outpatient Medications  Medication Instructions   albuterol  (VENTOLIN  HFA) 108 (90 Base) MCG/ACT inhaler 2 puffs, Inhalation, Every 6 hours PRN   cetirizine (ZYRTEC) 10 mg, Daily   EPINEPHrine 0.3 mg/0.3 mL IJ  SOAJ injection No dose, route, or frequency recorded.   esomeprazole  (NEXIUM ) 40 MG capsule TAKE 1 CAPSULE BY MOUTH TWICE DAILY BEFORE MEALS   gentamicin ointment (GARAMYCIN) 0.1 % 3 times daily   Iron-Vitamin C 65-125 MG TABS 1 tablet, Daily   meloxicam  (MOBIC ) 15 mg, Oral, Daily   mupirocin  ointment (BACTROBAN ) 2 % 1 Application, Topical, 2 times daily   pseudoephedrine (SUDAFED) 120 mg, Daily   Sertraline  HCl 150 mg, Daily   SUMAtriptan (IMITREX) 50 mg, As needed   topiramate (TOPAMAX) 25 mg, Daily   traZODone  (DESYREL ) 50-100 mg, At bedtime PRN     Allergies  Allergen Reactions   Amoxicillin-Pot Clavulanate Diarrhea   Nitrofurantoin Nausea And Vomiting and Other (See Comments)    Other reaction(s): Unknown  nitrofurantoin  nitrofurantoin, macrocrystals / nitrofurantoin, monohydrate     Past Surgical History:  Procedure Laterality Date   COLONOSCOPY WITH PROPOFOL  N/A 01/18/2019   Procedure: COLONOSCOPY WITH PROPOFOL ;  Surgeon: Therisa Bi, MD;  Location[facility] ENDOSCOPY;  Service: Gastroenterology;  Laterality: N/A;   NASAL SINUS SURGERY  2003   UTERINE FIBROID SURGERY  2019     Family History  Problem Relation Age of Onset   Hyperlipidemia Mother    Arthritis Mother    Asthma Father    Hyperlipidemia Father    Allergies Father    Brain cancer Father 18   Cancer Father    Cancer Paternal Grandmother    Cancer Paternal Grandfather    Migraines Maternal Aunt    Depression Paternal  Aunt    Breast cancer Neg Hx      Objective:  BP 134/82   Pulse 67   Temp 97.8 F (36.6 C)   Ht 5' 9.5 (1.765 m)   Wt 269 lb (122 kg)   LMP 06/19/2023   SpO2 95%   BMI 39.15 kg/m  She was weighed on the bioimpedance scale: Body mass index is 39.15 kg/m.  Visceral Fat rating : 14 , Body Fat %: 47.6%   Vitals Temp: 97.8 F (36.6 C) BP: 134/82 Pulse Rate: 67 SpO2: 95 %   Anthropometric Measurements Height: 5' 9.5 (1.765 m) Weight: 269 lb (122 kg) BMI (Calculated):  39.17 Weight at Last Visit: NA Weight Lost Since Last Visit: NA Weight Gained Since Last Visit: NA Starting Weight: NA Total Weight Loss (lbs): -- (NA) Peak Weight: 275lb Waist Measurement : -- (NA)   Body Composition  Body Fat %: 47.6 % Fat Mass (lbs): 128.2 lbs Muscle Mass (lbs): 134 lbs Total Body Water (lbs): 96.2 lbs Visceral Fat Rating : 14   Other Clinical Data A1c: -- (NA) RMR: -- (NA) Fasting: No Labs: No Today's Visit #: Consult Starting Date: -- (NA) Comments: Consult     General: Well Developed, well nourished, and in no acute distress.  HEENT: Normocephalic, atraumatic; EOMI, sclerae are anicteric. Skin: Warm and dry, good turgor Chest:  Normal excursion, shape, no gross ABN Respiratory: No conversational dyspnea; speaking in full sentences NeuroM-Sk:  Normal gross ROM * 4 extremities  Psych: A and O *3, insight adequate, mood- full    Assessment and Plan:   FOR THE DISEASE OF OBESITY:  Obesity (BMI 30-39.9) BMI 39.0-39.9,adult Assessment & Plan: We reviewed anthropometrics, biometrics, associated medical conditions and contributing factors with patient. Melissa Higgins would benefit from a medically tailored reduced calorie nutrional plan based on their REE (resting energy expenditure), which will be determined by indirect calorimetry.  We will also assess for cardiometabolic risk and nutritional derangements via fasting labs at intake appointment.    Obesity Treatment / Action Plan:   she was weighed on the bioimpedance scale and results were discussed and documented in the synopsis.   Melissa Higgins will complete provided nutritional and psychosocial assessment questionnaire before the next appointment.  she will be scheduled for indirect calorimetry to determine resting energy expenditure in a fasting state.  This will allow us  to create a reduced calorie, high-protein meal plan to promote loss of fat mass while preserving muscle mass.  We will  also assess for cardiometabolic risk and nutritional derangements via an ECG and fasting serologies at her next appointment.  she was encouraged to work on amassing support from family and friends to begin their weight loss journey.   Work on eliminating or reducing the presence of highly processed, poorly nutritious, calorie-dense foods in the home.   Obesity Education Performed Today:  Patient was counseled on nutritional approaches to weight loss and benefits of reducing processed foods and consuming plant-based foods and high quality protein as part of nutritional weight management program.   We discussed the importance of long term lifestyle changes which include nutrition, exercise and behavioral modifications as well as the importance of customizing this to her specific health and social needs.   We discussed the benefits of reaching a healthier weight to alleviate the symptoms of existing conditions and reduce the risks of the biomechanical, metabolic and psychological effects of obesity.  Was counseled on the health benefits of losing 5%-10% of total body weight.  Was counseled on our cognitive behavorial therapy program, lead by our bariatric psychologist, who focuses on emotional eating and creating positive behavorial change.  Was counseled on bariatric pharmacotherapy and how this may be used as an adjunct in their weight management    Melissa Higgins appears to be in the action stage of change and states they are ready to start intensive lifestyle modifications and behavioral modifications.  It was recommended that she follow up in the next 1-2 weeks to review the above steps, and to continue with treatment of their chronic disease state of obesity   FOR OTHER CONDITIONS RELATED TO THE DISEASE OF OBESITY:  Gastroesophageal reflux disease, unspecified whether esophagitis present Assessment & Plan: Condition well controlled. Currently taking Nexium  40 mg once daily. Tolerating  well with no adverse SE. No hx of gastric ulcers, duodenal ulcer, or eosinophilic esophagitis (EoE).  Recommended pt discuss with her PCP the option possibly decreasing her dose, given her condition is well controlled with no recent flare ups. Continue with her current med regimen as prescribed until advised otherwise. Discussed how GERD is expected to improve with successful weight loss. She would benefit from a low calorie, low carb and high protein/fiber diet if she decides to join the program.     Sleep apnea, unspecified type Assessment & Plan: Diagnosed in 2020. On 03/28/18 pt had sleep study with mild OSA with AHI of 12.8  per hour. Was put on auto-CPAP at the time. She has a repeat sleep study on 04/15/22 with an improved AHI of 6.3/hour. She was advised she did not require CPAP treatment at the time. She was later reccommended to resume CPAP therapy on 04/2023. Since then, she has resumed CPAP therapy, but admits to not being as compliant in the last month.   Reviewed how OSA is expected to improve with successful weight loss. Education provided on how OSA sx and poor sleep quality may make wt loss more challenging. Continue with CPAP therapy. Stressed the importance of working on adherence to CPAP. Will continue monitoring if pt decides to join the program.     Mood disorder [facility]) Assessment & Plan: Hx of anxiety/depression. Currently on Sertraline  150 m once daily. In the past 5 years she has been dealing with increased stress d/t her 2nd divorce, job changes, and family stressors.   Reviewed how moods affect eating habits, making wt loss more challenging. Pt was informed of our Bariatric Psychologist, Dr. Sharron, who can counsel on significant struggle with emotional eating if pt decides to join the program. Advised that additional mental health support is recommended to manage any coexisting or broader mood-related concerns beyond the scope of bariatric care. Pt verbalized understanding.  Will continue monitoring if pt joins program.     Attestations:   I, Vernell Forest, acting as a medical scribe for Barnie Jenkins, DO., have compiled all relevant documentation for today's office visit on behalf of Barnie Jenkins, DO, while in the presence of [employer], DO.  I have spent 52 minutes in the care of the patient today.  43 minutes was spent in face to face counseling of the patient on the disease of obesity and what our program can do for their medical conditions as well as in preventing future diseases. I discussed the importance of comprehensive care in the treatment of obesity including mental well being and physical activity.   I have reviewed the above documentation for accuracy and completeness, and I agree with the above. Barnie PARAS Amilee Janvier, D.O.  The 21st Century Cures Act was signed into law in 2016 which includes the topic of electronic health records.  This provides immediate access to information in MyChart.  This includes consultation notes, operative notes, office notes, lab results and pathology reports.  If you have any questions about what you read please let us  know at your next visit so we can discuss your concerns and take corrective action if need be.  We are right here with you!

## 2023-08-17 ENCOUNTER — Other Ambulatory Visit

## 2023-08-18 ENCOUNTER — Other Ambulatory Visit

## 2023-08-18 DIAGNOSIS — E7800 Pure hypercholesterolemia, unspecified: Secondary | ICD-10-CM | POA: Diagnosis not present

## 2023-08-18 LAB — LIPID PANEL
Cholesterol: 226 mg/dL — ABNORMAL HIGH (ref 0–200)
HDL: 44.4 mg/dL (ref >39.00)
LDL Cholesterol: 160 mg/dL — ABNORMAL HIGH (ref 0–99)
NonHDL: 181.45
Total CHOL/HDL Ratio: 5
Triglycerides: 105 mg/dL (ref 0.0–149.0)
VLDL: 21 mg/dL (ref 0.0–40.0)

## 2023-08-18 LAB — COMPREHENSIVE METABOLIC PANEL WITH GFR
ALT: 12 U/L (ref 0–35)
AST: 15 U/L (ref 0–37)
Albumin: 3.9 g/dL (ref 3.5–5.2)
Alkaline Phosphatase: 70 U/L (ref 39–117)
BUN: 23 mg/dL (ref 6–23)
CO2: 23 meq/L (ref 19–32)
Calcium: 8.4 mg/dL (ref 8.4–10.5)
Chloride: 108 meq/L (ref 96–112)
Creatinine, Ser: 0.81 mg/dL (ref 0.40–1.20)
GFR: 85.34 mL/min (ref >60.00)
Glucose, Bld: 100 mg/dL — ABNORMAL HIGH (ref 70–99)
Potassium: 4.1 meq/L (ref 3.5–5.1)
Sodium: 140 meq/L (ref 135–145)
Total Bilirubin: 0.5 mg/dL (ref 0.2–1.2)
Total Protein: 6.3 g/dL (ref 6.0–8.3)

## 2023-08-19 ENCOUNTER — Ambulatory Visit: Payer: Self-pay | Admitting: Internal Medicine

## 2023-08-24 ENCOUNTER — Ambulatory Visit (INDEPENDENT_AMBULATORY_CARE_PROVIDER_SITE_OTHER): Admitting: Otolaryngology

## 2023-09-04 ENCOUNTER — Other Ambulatory Visit: Payer: Self-pay | Admitting: Podiatry

## 2023-09-26 ENCOUNTER — Encounter: Payer: Self-pay | Admitting: Internal Medicine

## 2023-09-27 NOTE — Telephone Encounter (Signed)
 You have an open slot for 10/03/23 at 4pm virtual. Would you like pt to be seen in person that day?

## 2023-09-27 NOTE — Telephone Encounter (Signed)
 Ok to work in as we discussed. Confirm ok to wait. Send back to me to place a new referral.

## 2023-09-27 NOTE — Telephone Encounter (Signed)
 Spoke to pt. Pt stated that she has been experiencing a lot more exhaustion, headaches and not feeling motivated. Confirmed with pt that she wasn't have any negative thoughts or an emergency. Pt stated that she is okay just wants to revisit treatment options and get a new referral to a different doctor. Scheduled pt 09/23 at 4pm informed pt if she gets worse to let us  know. Pt verbalized understanding

## 2023-10-03 ENCOUNTER — Ambulatory Visit: Admitting: Internal Medicine

## 2023-10-03 VITALS — BP 126/70 | HR 74 | Resp 16 | Ht 69.5 in | Wt 270.0 lb

## 2023-10-03 DIAGNOSIS — D649 Anemia, unspecified: Secondary | ICD-10-CM

## 2023-10-03 DIAGNOSIS — R739 Hyperglycemia, unspecified: Secondary | ICD-10-CM

## 2023-10-03 DIAGNOSIS — N926 Irregular menstruation, unspecified: Secondary | ICD-10-CM

## 2023-10-03 DIAGNOSIS — E7800 Pure hypercholesterolemia, unspecified: Secondary | ICD-10-CM

## 2023-10-03 DIAGNOSIS — K219 Gastro-esophageal reflux disease without esophagitis: Secondary | ICD-10-CM

## 2023-10-03 DIAGNOSIS — R5383 Other fatigue: Secondary | ICD-10-CM

## 2023-10-03 DIAGNOSIS — R519 Headache, unspecified: Secondary | ICD-10-CM | POA: Diagnosis not present

## 2023-10-03 DIAGNOSIS — G4730 Sleep apnea, unspecified: Secondary | ICD-10-CM

## 2023-10-03 DIAGNOSIS — F439 Reaction to severe stress, unspecified: Secondary | ICD-10-CM

## 2023-10-03 DIAGNOSIS — Z23 Encounter for immunization: Secondary | ICD-10-CM | POA: Diagnosis not present

## 2023-10-03 DIAGNOSIS — M5416 Radiculopathy, lumbar region: Secondary | ICD-10-CM

## 2023-10-03 NOTE — Progress Notes (Signed)
 Subjective:    Patient ID: Melissa Higgins, female    DOB: Jan 14, 1975, 48 y.o.   MRN: [id number]  Patient here for  Chief Complaint  Patient presents with   Medical Management of Chronic Issues    HPI Here for work in appt - work in to discuss increased fatigue, headaches and increased stress. Has a history of OSA. Is using cpap. Does not feel is helping with her energy. Has been seeing ortho for L5 radicular pain with facet arthrosis L5-S1 - s/p ESI. Has seen neurology - recommended topamax, imitrex prn. Recommended neuro-ophthalmology appt. States had migraine 2 weeks ago. Imitrex x 2. Resolved. Taking 2 topamax at bedtime. Has regular headaches regularly. Has f/u with neurology next month. Takes excedrin migraine 1-2x/week. Also taking trazodone . Feels like she is in a fog the next day. Describes decreased energy. No desire to do anything. Emotional swings and fluctuating mood. No period since 06/16/23. Discussed regular exercise. Breathing stable. Bowels appears to be stable.    Past Medical History:  Diagnosis Date   Allergy    Anxiety    C. difficile colitis    augment use   Depression    Dysmenorrhea    Environmental allergies    allergy shots   GERD (gastroesophageal reflux disease)    History of frequent urinary tract infections    Hypercholesterolemia    Sleep apnea    CPAP used at home   Past Surgical History:  Procedure Laterality Date   COLONOSCOPY WITH PROPOFOL  N/A 01/18/2019   Procedure: COLONOSCOPY WITH PROPOFOL ;  Surgeon: Therisa Bi, MD;  Location: [facility] ENDOSCOPY;  Service: Gastroenterology;  Laterality: N/A;   NASAL SINUS SURGERY  2003   UTERINE FIBROID SURGERY  2019   Family History  Problem Relation Age of Onset   Hyperlipidemia Mother    Arthritis Mother    Asthma Father    Hyperlipidemia Father    Allergies Father    Brain cancer Father 80   Cancer Father    Cancer Paternal Grandmother    Cancer Paternal Grandfather    Migraines Maternal Aunt     Depression Paternal Aunt    Breast cancer Neg Hx    Social History   Socioeconomic History   Marital status: Divorced    Spouse name: Not on file   Number of children: 0   Years of education: college   Highest education level: Master's degree (e.g., MA, MS, MEng, MEd, MSW, MBA)  Occupational History   Not on file  Tobacco Use   Smoking status: Never   Smokeless tobacco: Never  Vaping Use   Vaping status: Never Used  Substance and Sexual Activity   Alcohol use: Not Currently    Comment: occasional   Drug use: No   Sexual activity: Yes    Birth control/protection: None  Other Topics Concern   Not on file  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   Social Drivers of Corporate investment banker Strain: Low Risk  (10/02/2023)   Overall Financial Resource Strain (CARDIA)    Difficulty of Paying Living Expenses: Not hard at all  Food Insecurity: No Food Insecurity (10/02/2023)   Hunger Vital Sign    Worried About Running Out of Food in the Last Year: Never true    Ran Out of Food in the Last Year: Never true  Transportation Needs: No Transportation Needs (10/02/2023)   PRAPARE - Transportation    Lack  of Transportation (Medical): No    Lack of Transportation (Non-Medical): No  Physical Activity: Insufficiently Active (10/02/2023)   Exercise Vital Sign    Days of Exercise per Week: 2 days    Minutes of Exercise per Session: 10 min  Stress: Stress Concern Present (10/02/2023)   [employer] of Occupational Health - Occupational Stress Questionnaire    Feeling of Stress: Rather much  Social Connections: Moderately Isolated (10/02/2023)   Social Connection and Isolation Panel    Frequency of Communication with Friends and Family: More than three times a week    Frequency of Social Gatherings with Friends and Family: Once a week    Attends Religious Services: 1 to 4 times per year    Active Member of [employer] or Organizations: No     Attends Engineer, structural: Not on file    Marital Status: Divorced     Review of Systems  Constitutional:  Positive for fatigue. Negative for appetite change and unexpected weight change.  HENT:  Negative for congestion and sinus pressure.   Respiratory:  Negative for cough, chest tightness and shortness of breath.   Cardiovascular:  Negative for chest pain, palpitations and leg swelling.  Gastrointestinal:  Negative for abdominal pain, diarrhea, nausea and vomiting.  Genitourinary:  Negative for difficulty urinating and dysuria.  Musculoskeletal:  Negative for joint swelling and myalgias.  Skin:  Negative for color change and rash.  Neurological:  Positive for headaches. Negative for dizziness.  Psychiatric/Behavioral:         Increased stress. Decreased motivation. Increased frustration.        Objective:     BP 126/70   Pulse 74   Resp 16   Ht 5' 9.5 (1.765 m)   Wt 270 lb (122.5 kg)   SpO2 98%   BMI 39.30 kg/m  Wt Readings from Last 3 Encounters:  10/03/23 270 lb (122.5 kg)  08/16/23 269 lb (122 kg)  07/18/23 266 lb 4 oz (120.8 kg)    Physical Exam Vitals reviewed.  Constitutional:      General: She is not in acute distress.    Appearance: Normal appearance.  HENT:     Head: Normocephalic and atraumatic.     Right Ear: External ear normal.     Left Ear: External ear normal.     Mouth/Throat:     Pharynx: No oropharyngeal exudate or posterior oropharyngeal erythema.  Eyes:     General: No scleral icterus.       Right eye: No discharge.        Left eye: No discharge.     Conjunctiva/sclera: Conjunctivae normal.  Neck:     Thyroid : No thyromegaly.  Cardiovascular:     Rate and Rhythm: Normal rate and regular rhythm.  Pulmonary:     Effort: No respiratory distress.     Breath sounds: Normal breath sounds. No wheezing.  Abdominal:     General: Bowel sounds are normal.     Palpations: Abdomen is soft.     Tenderness: There is no abdominal  tenderness.  Musculoskeletal:        General: No swelling or tenderness.     Cervical back: Neck supple. No tenderness.  Lymphadenopathy:     Cervical: No cervical adenopathy.  Skin:    Findings: No erythema or rash.  Neurological:     Mental Status: She is alert.  Psychiatric:        Mood and Affect: Mood normal.  Behavior: Behavior normal.         Outpatient Encounter Medications as of 10/03/2023  Medication Sig   cetirizine (ZYRTEC) 10 MG tablet Take 10 mg by mouth daily.   EPINEPHrine 0.3 mg/0.3 mL IJ SOAJ injection    esomeprazole  (NEXIUM ) 40 MG capsule TAKE 1 CAPSULE BY MOUTH TWICE DAILY BEFORE MEALS   gentamicin ointment (GARAMYCIN) 0.1 % Apply topically 3 (three) times daily.   meloxicam  (MOBIC ) 15 MG tablet TAKE 1 TABLET(15 MG) BY MOUTH DAILY   mupirocin  ointment (BACTROBAN ) 2 % Apply 1 Application topically 2 (two) times daily. (Patient not taking: Reported on 08/16/2023)   pseudoephedrine (SUDAFED) 120 MG 12 hr tablet Take 120 mg by mouth daily.   Sertraline  HCl 150 MG CAPS Take 150 mg by mouth daily.   SUMAtriptan (IMITREX) 50 MG tablet Take 50 mg by mouth as needed.   topiramate (TOPAMAX) 25 MG tablet Take 25 mg by mouth daily. 2 weeks at 25 then 50 mg (Patient taking differently: Take 50 mg by mouth daily. 2 weeks at 25 then 50 mg)   traZODone  (DESYREL ) 50 MG tablet Take 50-100 mg by mouth at bedtime as needed. (Patient taking differently: Take 50-150 mg by mouth at bedtime as needed for sleep.)   [DISCONTINUED] albuterol  (VENTOLIN  HFA) 108 (90 Base) MCG/ACT inhaler Inhale 2 puffs into the lungs every 6 (six) hours as needed for wheezing or shortness of breath.   [DISCONTINUED] Iron-Vitamin C 65-125 MG TABS Take 1 tablet by mouth daily.   No facility-administered encounter medications on file as of 10/03/2023.     Lab Results  Component Value Date   WBC 9.8 04/04/2023   HGB 13.2 04/04/2023   HCT 40.1 04/04/2023   PLT 253.0 04/04/2023   GLUCOSE 100 (H)  08/18/2023   CHOL 226 (H) 08/18/2023   TRIG 105.0 08/18/2023   HDL 44.40 08/18/2023   LDLCALC 160 (H) 08/18/2023   ALT 12 08/18/2023   AST 15 08/18/2023   NA 140 08/18/2023   K 4.1 08/18/2023   CL 108 08/18/2023   CREATININE 0.81 08/18/2023   BUN 23 08/18/2023   CO2 23 08/18/2023   TSH 2.17 07/07/2022   HGBA1C 5.7 03/05/2020    MM 3D SCREENING MAMMOGRAM BILATERAL BREAST Result Date: 08/14/2023 CLINICAL DATA:  Screening. EXAM: DIGITAL SCREENING BILATERAL MAMMOGRAM WITH TOMOSYNTHESIS AND CAD TECHNIQUE: Bilateral screening digital craniocaudal and mediolateral oblique mammograms were obtained. Bilateral screening digital breast tomosynthesis was performed. The images were evaluated with computer-aided detection. COMPARISON:  Previous exam(s). ACR Breast Density Category c: The breasts are heterogeneously dense, which may obscure small masses. FINDINGS: There are no findings suspicious for malignancy. IMPRESSION: No mammographic evidence of malignancy. A result letter of this screening mammogram will be mailed directly to the patient. RECOMMENDATION: Screening mammogram in one year. (Code:SM-B-01Y) BI-RADS CATEGORY  1: Negative. Electronically Signed   By: Alm Parkins M.D.   On: 08/14/2023 07:55       Assessment & Plan:  Other fatigue Assessment & Plan: Persistent. Feel is multifactorial. Using cpap. Seeing psychiatry. Continues on sertraline . Request psychiatry change - request to be seen through hospital [facility] psychiatry. Will decrease trazodone  dose. Have her take 1/2 tablet. Feeling in a fog this next am. Discussed exercise. Call with update.    Need for influenza vaccination -     Flu vaccine trivalent PF, 6mos and older(Flulaval,Afluria,Fluarix,Fluzone)  Anemia, unspecified type Assessment & Plan: Hgb checked 03/2023 - wnl.    Gastroesophageal reflux disease, unspecified whether esophagitis  present Assessment & Plan: Controlled. Off PPI.    Acute nonintractable headache,  unspecified headache type Assessment & Plan: Saw neurology 07/12/23 - evaluation of headaches. Diagnosed - intractable migraine. Recommended referral to neuro-ophthalmology for optic nerve elevation evaluation.  Also recommended to start topamax (increased to 50mg  07/27/23) and imitrex. Also use cpap and avoid possible triggers. Seeing orho for back pain. 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 S/p ESI. Continue f/u for back. S/p ESI. Continue f/u with neurology and planning appt with neuroopthalmology. Decrease trazodone . Monitor fo rtriggers.    Hypercholesterolemia Assessment & Plan: Low cholesterol diet and exercise. Follow lipid panel    Hyperglycemia Assessment & Plan: Low carb diet and exercise.  Follow met b and a1c.    Lumbar radiculopathy Assessment & Plan: Seeing ortho. S/p ESI. Did help. Follow. Continue stretches/exercise.    Menstrual irregularity Assessment & Plan: No period since 06/16/23.    Sleep apnea, unspecified type Assessment & Plan: Is followed by pulmonary. Using cpap. Does not feel is helping with her increased fatigue.    Stress Assessment & Plan: Increased stress and frustration with her current medical issues and concerns. Currently on sertraline  and trazodone . Will decrease trazodone  to 1/2 dose - given making her feel like she is in a fog when wakes up. Follow. Request referral to [facility] psychiatry.   Orders: -     Ambulatory referral to Psychiatry     Allena Hamilton, MD

## 2023-10-06 NOTE — Telephone Encounter (Signed)
 Appt rescheduled

## 2023-10-08 ENCOUNTER — Encounter: Payer: Self-pay | Admitting: Internal Medicine

## 2023-10-08 NOTE — Assessment & Plan Note (Signed)
 Low carb diet and exercise.  Follow met b and a1c.

## 2023-10-08 NOTE — Assessment & Plan Note (Signed)
 No period since 06/16/23.

## 2023-10-08 NOTE — Assessment & Plan Note (Signed)
 Increased stress and frustration with her current medical issues and concerns. Currently on sertraline  and trazodone . Will decrease trazodone  to 1/2 dose - given making her feel like she is in a fog when wakes up. Follow. Request referral to [facility] psychiatry.

## 2023-10-08 NOTE — Assessment & Plan Note (Signed)
 Persistent. Feel is multifactorial. Using cpap. Seeing psychiatry. Continues on sertraline . Request psychiatry change - request to be seen through hospital [facility] psychiatry. Will decrease trazodone  dose. Have her take 1/2 tablet. Feeling in a fog this next am. Discussed exercise. Call with update.

## 2023-10-08 NOTE — Assessment & Plan Note (Signed)
 Seeing ortho. S/p ESI. Did help. Follow. Continue stretches/exercise.

## 2023-10-08 NOTE — Assessment & Plan Note (Signed)
 Controlled. Off PPI.

## 2023-10-08 NOTE — Assessment & Plan Note (Signed)
 Low cholesterol diet and exercise.  Follow lipid panel.

## 2023-10-08 NOTE — Assessment & Plan Note (Signed)
 Is followed by pulmonary. Using cpap. Does not feel is helping with her increased fatigue.

## 2023-10-08 NOTE — Assessment & Plan Note (Signed)
 Saw neurology 07/12/23 - evaluation of headaches. Diagnosed - intractable migraine. Recommended referral to neuro-ophthalmology for optic nerve elevation evaluation.  Also recommended to start topamax (increased to 50mg  07/27/23) and imitrex. Also use cpap and avoid possible triggers. Seeing orho for back pain. 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 S/p ESI. Continue f/u for back. S/p ESI. Continue f/u with neurology and planning appt with neuroopthalmology. Decrease trazodone . Monitor fo rtriggers.

## 2023-10-08 NOTE — Assessment & Plan Note (Signed)
 Hgb checked 03/2023 - wnl.

## 2023-10-26 ENCOUNTER — Encounter (INDEPENDENT_AMBULATORY_CARE_PROVIDER_SITE_OTHER): Payer: Self-pay

## 2023-10-26 ENCOUNTER — Ambulatory Visit: Admitting: Internal Medicine

## 2023-10-30 ENCOUNTER — Ambulatory Visit (INDEPENDENT_AMBULATORY_CARE_PROVIDER_SITE_OTHER): Admitting: Family Medicine

## 2023-10-30 ENCOUNTER — Encounter (INDEPENDENT_AMBULATORY_CARE_PROVIDER_SITE_OTHER): Payer: Self-pay

## 2023-11-13 ENCOUNTER — Ambulatory Visit: Admitting: Internal Medicine

## 2023-11-16 ENCOUNTER — Ambulatory Visit (INDEPENDENT_AMBULATORY_CARE_PROVIDER_SITE_OTHER): Admitting: Family Medicine

## 2023-11-21 ENCOUNTER — Encounter (INDEPENDENT_AMBULATORY_CARE_PROVIDER_SITE_OTHER): Payer: Self-pay | Admitting: Family Medicine

## 2023-11-21 ENCOUNTER — Ambulatory Visit (INDEPENDENT_AMBULATORY_CARE_PROVIDER_SITE_OTHER): Admitting: Family Medicine

## 2023-11-21 ENCOUNTER — Ambulatory Visit (INDEPENDENT_AMBULATORY_CARE_PROVIDER_SITE_OTHER): Payer: Self-pay | Admitting: Family Medicine

## 2023-11-21 VITALS — BP 122/84 | HR 68 | Temp 98.0°F | Ht 70.5 in | Wt 260.0 lb

## 2023-11-21 DIAGNOSIS — E7800 Pure hypercholesterolemia, unspecified: Secondary | ICD-10-CM

## 2023-11-21 DIAGNOSIS — R0602 Shortness of breath: Secondary | ICD-10-CM | POA: Diagnosis not present

## 2023-11-21 DIAGNOSIS — F39 Unspecified mood [affective] disorder: Secondary | ICD-10-CM

## 2023-11-21 DIAGNOSIS — Z1331 Encounter for screening for depression: Secondary | ICD-10-CM | POA: Diagnosis not present

## 2023-11-21 DIAGNOSIS — R5383 Other fatigue: Secondary | ICD-10-CM | POA: Diagnosis not present

## 2023-11-21 DIAGNOSIS — Z6839 Body mass index (BMI) 39.0-39.9, adult: Secondary | ICD-10-CM

## 2023-11-21 DIAGNOSIS — E559 Vitamin D deficiency, unspecified: Secondary | ICD-10-CM

## 2023-11-21 DIAGNOSIS — F5089 Other specified eating disorder: Secondary | ICD-10-CM

## 2023-11-21 DIAGNOSIS — G4730 Sleep apnea, unspecified: Secondary | ICD-10-CM

## 2023-11-21 DIAGNOSIS — E669 Obesity, unspecified: Secondary | ICD-10-CM

## 2023-11-21 DIAGNOSIS — Z6836 Body mass index (BMI) 36.0-36.9, adult: Secondary | ICD-10-CM

## 2023-11-21 NOTE — Progress Notes (Signed)
 Melissa Higgins, D.O.  AB[facility], AB[facility] Specializing in Clinical Bariatric Medicine Office located at: [address]  [place], [place]  [address]   Bariatric Medicine Visit  Dear Melissa Shad, MD   Thank you for referring Melissa Higgins to our clinic today for evaluation.  We performed a consultation to discuss her options for treatment and educate the patient on her disease state.  The following note includes my evaluation and treatment recommendations.   Please do not hesitate to reach out to me directly if you have any further concerns.    Assessment and Plan:   Orders Placed This Encounter  Procedures   Folate   Insulin, random   Hemoglobin A1c   Vitamin B12   VITAMIN D 25 Hydroxy (Vit-D Deficiency, Fractures)   TSH   T4, free   Lipid panel   Comprehensive metabolic panel with GFR   CBC with Differential/Platelet   Magnesium    EKG 12-Lead    Medications Discontinued During This Encounter  Medication Reason   mupirocin  ointment (BACTROBAN ) 2 % Patient Preference     FOR THE DISEASE OF OBESITY:  BMI 39.0-39.9,adult, start BMI 36.77 Assessment & Plan: Melissa Higgins is currently in the action stage of change. As such, her goal is to start our weight management plan.  She has agreed to implement: follow the Category 2 plan w/ B/L options with the option to Journal breakfast 200-250 calories with 20+ g protein.  Behavioral Intervention We discussed the following Behavioral Modification Strategies today: increasing lean protein intake to established goals, identifying sources and decreasing liquid calories, work on managing stress, creating time for self-care and relaxation, and better snacking choices  Additional resources provided today: Handout on CAT 2 meal plan, Handout on CAT 1-2 breakfast options, and Handout on CAT 1-2 lunch options, Handout on Sample Grocery List  Evidence-based interventions for health behavior change were utilized today including the  discussion of self monitoring techniques, problem-solving barriers and SMART goal setting techniques.    Goal(s) for next OV: weigh veggies and measure proteins     Recommended Physical Activity Goals Melissa Higgins has been advised to work up to 300-450  minutes of moderate intensity aerobic activity a week and strengthening exercises 2-3 times per week for cardiovascular health, weight loss maintenance and preservation of muscle mass.   She has agreed to : maintain current level of activity.    Pharmacotherapy We discussed various medication options to help Melissa Higgins with her weight loss efforts and we both agreed to: Begin with nutritional and behavioral strategies   ASSOCIATED CONDITIONS ADDRESSED TODAY:  Fatigue Assessment & Plan: Melissa Higgins does feel that her weight is causing her energy to be lower than it should be. Fatigue may be related to obesity, depression or many other causes. she does not appear to have any red flag symptoms and this appears to most likely be related to her current lifestyle habits and dietary intake.  Labs will be ordered and reviewed with her at their next office visit in two weeks.  Epworth sleepiness scale is 7 and appears to be within normal limits. Melissa Higgins denies daytime somnolence and admits to waking up still tired. Patient has a history of symptoms of morning headache. Melissa Higgins generally gets 7 hours of sleep per night, and states that she has difficulty falling asleep. Snoring is present. Apneic episodes are not present.   ECG: Performed and reviewed/ interpreted independently.  Normal sinus rhythm, rate 62 bpm; reassuring without any acute abnormalities, will continue to  monitor for symptoms     Shortness of breath on exertion Assessment & Plan: Melissa Higgins does feel that she gets out of breath more easily than she used to when she exercises and seems to be worsening over time with weight gain.  This has gotten worse recently. Melissa Higgins denies shortness  of breath at rest or orthopnea. Pt denies chest pain, dizziness, heart palpitations, or excessive diaphoresis or nausea with activity.  This is not new and is ongoing.  Melissa Higgins's shortness of breath appears to be obesity related and exercise induced, as they do not appear to have any red flag symptoms/ concerns today.  Also, this condition appears to be related to a state of poor cardiovascular conditioning   Obtain labs today and will be reviewed with her at their next office visit in two weeks.  Indirect Calorimeter completed today to help guide our dietary regimen. It shows a VO2 of 259 and a REE of 1786.  Her calculated basal metabolic rate is 7988 thus her resting energy expenditure is worse than expected.  Patient agreed to work on weight loss at this time.  As Melissa Higgins progresses through our weight loss program, we will gradually increase exercise as tolerated to treat her current condition.   If Melissa Higgins follows our recommendations and loses 5-10% of their weight without improvement of her shortness of breath or if at any time, symptoms become more concerning, they agree to urgently follow up with their PCP/ specialist for further consideration/ evaluation.   Melissa Higgins verbalizes agreement with this plan.    Mood disorder - Emotional Eating Depression Screen  Assessment & Plan: Her PHQ-9 score was 10 today. Making life very difficult. Denies any SI/HI. Patient states that she is in the process of finding a new psychiatrist. She states that she also has a veterinary surgeon but has not seen her in a while but is planning to see her soon. Patient is taking Sertraline  150 mg once daily. Good compliance and tolerance. Patient states that she eats when she is stressed, bored and as a reward. Patient denies a referral to Dr.Barker at this time. Continue with medication and recommended patient that she reach out to her PCP about getting a referral to a new psychiatrist and getting back in contact with her  counselor.     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 She did a repeat study in 04/15/22 with an AHI of 6.3 per hour. She was told that she no longer required the usage of CPAP. Since then she was told to restart her CPAP in 04/2023. She states that she feels like she is fighting with the machine at night. She takes Trazadone 50-150 prn but states that she is taking 50 mg every other night and magnesium  glycinate the other nights. She endorses waking up exhausted every day. Recommended patient to reach out to sleep study doctor or PCP about her poor sleep quality. Stressed the importance of getting her sleep apnea under control.     Hypercholesterolemia Assessment & Plan Lab Results  Component Value Date   CHOL 226 (H) 08/18/2023   HDL 44.40 08/18/2023   LDLCALC 160 (H) 08/18/2023   TRIG 105.0 08/18/2023   CHOLHDL 5 08/18/2023    Patient is not currently on any medications for her cholesterol. Diet and life style controlled. Explained to patient the importance of beginning to follow prudent meal plan  and decreasing foods high saturated and trans fat. Will obtain labs today and review at next OV.     Vitamin D deficiency Assessment & Plan Patient does not have any Vit D labs that we can see today. Patient states that she is taking OTC Vit D 1000 lU daily. With good compliance and tolerance. Continue with supplementation. Will obtain labs today and review at next OV.      FOLLOW UP:   Follow up in 2 weeks. She was informed of the importance of frequent follow up visits to maximize her success with intensive lifestyle modifications for her multiple health conditions.  Melissa Higgins is aware that we will review all of her lab results at our next visit.  She is aware that if anything is critical/ life threatening with the results, we will be contacting her via MyChart  prior to the office visit to discuss management.     Chief Complaint:   OBESITY Melissa Higgins (MR# [id number]) is a pleasant 48 y.o. female who presents for evaluation and treatment of obesity and related comorbidities. Current BMI is Body mass index is 36.78 kg/m. Melissa Higgins has been struggling with her weight for many years and has been unsuccessful in either losing weight, maintaining weight loss, or reaching her healthy weight goal.  Melissa Higgins is currently in the action stage of change and ready to dedicate time achieving and maintaining a healthier weight. Melissa Higgins is interested in becoming our patient and working on intensive lifestyle modifications including (but not limited to) diet and exercise for weight loss.  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 Patient is single and does not have children. She lives alone.  - Walks 3-4 times a week for 15-20 minutes  - Averages about 5500 steps per day.   - Wishes to be 180 lbs by summer of 2026.  - Wants to do this to feel better and have more energy.   - Started gaining weight because she went through her second divorce,COVID and working remotely for a year that lead to poor eating habits.   - Highest weight was 275 lbs  - At 48 years old she was 150 lbs   - Has tried clean eating and lost 25 lbs but slowly regained it.   - Eats outside of home with take out or fast food 1 to 2 times a week.  - Craves sugar, sweets like ice cream and chocolate.   - Dislikes seafood and eggs.   - Frequently snacks after dinner.   - Drinks caloric beverages like: regular soda 1-2 cans per week, milk, juice. Sweet tea with honey, fruit smoothies, ALANI and premier protein shakes.   - Drinks hard cider or liquor 2-3 drinks once a week  - Worst food habit: eating way too many sweets ( ice cream, cookies, cake and chocolate)     Subjective:   This is the patient's first visit at [facility].  The patient's NEW PATIENT PACKET that they filled out prior to today's office visit was reviewed at length and information from that paperwork was included within the following office visit note.    Included in the packet: current and past health history, medications, allergies, ROS, gynecologic history (women only), surgical history, family history, social history, weight history, weight loss surgery history (for those that have had weight loss surgery), nutritional evaluation, mood and food questionnaire along with a depression screening (PHQ9)  on all patients, an Epworth questionnaire, sleep habits questionnaire, patient life and health improvement goals questionnaire. These will all be scanned into the patient's chart under the media tab.   Review of Systems: Please refer to new patient packet scanned into media. Pertinent positives were addressed with patient today.  Reviewed by clinician on day of visit: allergies, medications, problem list, medical history, surgical history, family history, social history, and previous encounter notes.  During the visit, I independently reviewed the patient's EKG, bioimpedance scale results, and indirect calorimeter results. I used this information to tailor a meal plan for the patient that will help Melissa Higgins to lose weight and will improve her obesity-related conditions going forward.  I performed a medically necessary appropriate examination and/or evaluation. I discussed the assessment and treatment plan with the patient. The patient was provided an opportunity to ask questions and all were answered. The patient agreed with the plan and demonstrated an understanding of the instructions. Labs were ordered today (unless patient declined them) and will be reviewed with the patient at our next visit unless more critical results need to be addressed immediately. Clinical information was updated and documented in the EMR.     Objective:   PHYSICAL EXAM: Blood pressure 122/84, pulse 68, temperature 98 F (36.7 C), height 5' 10.5 (1.791 m), weight 260 lb (117.9 kg), last menstrual period 06/16/2023, SpO2 94%. Body mass index is 36.78 kg/m.  General: Well Developed, well nourished, and in no acute distress.  HEENT: Normocephalic, atraumatic; EOMI, sclerae are anicteric. Skin: Warm and dry, good turgor Chest:  Normal excursion, shape, no gross ABN Respiratory: No conversational dyspnea; speaking in full sentences NeuroM-Sk:  Normal gross ROM * 4 extremities  Psych: A and O *3, insight adequate, mood- full   Anthropometric Measurements Height: 5' 10.5 (1.791 m) Weight: 260 lb (117.9 kg) BMI (Calculated): 36.77 Weight at Last Visit: 0 Weight Lost Since Last Visit: 0 Weight Gained Since Last Visit: 0 Starting Weight: 260lb Total Weight Loss (lbs): 0 lb (0 kg) Peak Weight: 275lb   Body Composition  Body Fat %: 45.1 % Fat Mass (lbs): 117.6 lbs Muscle Mass (lbs): 135.8 lbs Total Body Water (lbs): 90.6 lbs Visceral Fat Rating : 12   Other Clinical Data RMR: [id number] Fasting: Yes Labs: Yes Today's Visit #: 1 Starting Date: 11/21/23 Comments: New Pt.    DIAGNOSTIC DATA REVIEWED:  BMET    Component Value Date/Time   NA 140 08/18/2023 0932   K 4.1 08/18/2023 0932   CL 108 08/18/2023 0932   CO2 23 08/18/2023 0932   GLUCOSE 100 (H) 08/18/2023 0932   BUN 23 08/18/2023 0932   CREATININE 0.81 08/18/2023 0932   CREATININE 0.72 11/25/2016 1608   CALCIUM 8.4 08/18/2023 0932   GFRNONAA >60 01/25/2023 1631   Lab Results  Component Value Date   HGBA1C 5.7 03/05/2020   HGBA1C 5.9 05/02/2018   No results found for: INSULIN Lab Results  Component Value Date   TSH 2.17 07/07/2022   CBC    Component Value Date/Time   WBC 9.8 04/04/2023 1625   RBC 4.44 04/04/2023 1625   HGB 13.2 04/04/2023 1625   HCT 40.1 04/04/2023 1625   PLT 253.0 04/04/2023 1625   MCV 90.3 04/04/2023 1625   MCH  29.1 01/25/2023 1631   MCHC 32.9 04/04/2023 1625   RDW 13.8 04/04/2023 1625   Iron Studies    Component Value Date/Time   IRON 28 (L) 11/23/2018 0957   FERRITIN 18.2 04/19/2019  1146   IRONPCTSAT 5.6 (L) 11/23/2018 0957   Lipid Panel     Component Value Date/Time   CHOL 226 (H) 08/18/2023 0932   TRIG 105.0 08/18/2023 0932   HDL 44.40 08/18/2023 0932   CHOLHDL 5 08/18/2023 0932   VLDL 21.0 08/18/2023 0932   LDLCALC 160 (H) 08/18/2023 0932   LDLCALC 155 (H) 11/25/2016 1608   Hepatic Function Panel     Component Value Date/Time   PROT 6.3 08/18/2023 0932   ALBUMIN 3.9 08/18/2023 0932   ALBUMIN 3.8 (L) 03/16/2023 1229   AST 15 08/18/2023 0932   ALT 12 08/18/2023 0932   ALKPHOS 70 08/18/2023 0932   BILITOT 0.5 08/18/2023 0932   BILIDIR 0.1 04/19/2019 1146      Component Value Date/Time   TSH 2.17 07/07/2022 1112   Nutritional No results found for: VD25OH  Attestation Statements:   ISonny Laroche, acting as a medical scribe for Melissa Jenkins, DO., have compiled all relevant documentation for today's office visit on behalf of Melissa Jenkins, DO, while in the presence of [employer], DO.  I have spent 58 minutes in the care of the patient today including: 38 minutes face-to-face assessing and reviewing listed medical problems above as outlined in office visit note, providing nutritional and behavioral counseling as outlined in obesity care plan, independently interpreting results and goals of care, see listed medical problems, and discussing biometric information and progress. 10 minutes was spent on review of chart/new patient packet and additional post-visit documentation.  I have reviewed the above documentation for accuracy and completeness, and I agree with the above. Melissa JINNY Higgins, D.O.  The 21st Century Cures Act was signed into law in 2016 which includes the topic of electronic health records.  This provides immediate access to information in MyChart.   This includes consultation notes, operative notes, office notes, lab results and pathology reports.  If you have any questions about what you read please let us  know at your next visit so we can discuss your concerns and take corrective action if need be.  We are right here with you.

## 2023-11-22 LAB — CBC WITH DIFFERENTIAL/PLATELET
Basophils Absolute: 0 x10E3/uL (ref 0.0–0.2)
Basos: 0 %
EOS (ABSOLUTE): 0 x10E3/uL (ref 0.0–0.4)
Eos: 0 %
Hematocrit: 47.5 % — ABNORMAL HIGH (ref 34.0–46.6)
Hemoglobin: 15.6 g/dL (ref 11.1–15.9)
Immature Grans (Abs): 0.1 x10E3/uL (ref 0.0–0.1)
Immature Granulocytes: 1 %
Lymphocytes Absolute: 3.2 x10E3/uL — ABNORMAL HIGH (ref 0.7–3.1)
Lymphs: 29 %
MCH: 31 pg (ref 26.6–33.0)
MCHC: 32.8 g/dL (ref 31.5–35.7)
MCV: 94 fL (ref 79–97)
Monocytes Absolute: 0.7 x10E3/uL (ref 0.1–0.9)
Monocytes: 6 %
Neutrophils Absolute: 7.2 x10E3/uL — ABNORMAL HIGH (ref 1.4–7.0)
Neutrophils: 64 %
Platelets: 304 x10E3/uL (ref 150–450)
RBC: 5.04 x10E6/uL (ref 3.77–5.28)
RDW: 12.8 % (ref 11.7–15.4)
WBC: 11.3 x10E3/uL — ABNORMAL HIGH (ref 3.4–10.8)

## 2023-11-22 LAB — LIPID PANEL
Chol/HDL Ratio: 4.9 ratio — ABNORMAL HIGH (ref 0.0–4.4)
Cholesterol, Total: 270 mg/dL — ABNORMAL HIGH (ref 100–199)
HDL: 55 mg/dL (ref >39)
LDL Chol Calc (NIH): 187 mg/dL — ABNORMAL HIGH (ref 0–99)
Triglycerides: 154 mg/dL — ABNORMAL HIGH (ref 0–149)
VLDL Cholesterol Cal: 28 mg/dL (ref 5–40)

## 2023-11-22 LAB — T4, FREE: Free T4: 1.11 ng/dL (ref 0.82–1.77)

## 2023-11-22 LAB — FOLATE: Folate: 17.7 ng/mL (ref >3.0)

## 2023-11-22 LAB — COMPREHENSIVE METABOLIC PANEL WITH GFR
ALT: 14 IU/L (ref 0–32)
AST: 13 IU/L (ref 0–40)
Albumin: 4.4 g/dL (ref 3.9–4.9)
Alkaline Phosphatase: 117 IU/L — ABNORMAL HIGH (ref 41–116)
BUN/Creatinine Ratio: 29 — ABNORMAL HIGH (ref 9–23)
BUN: 24 mg/dL (ref 6–24)
Bilirubin Total: 0.5 mg/dL (ref 0.0–1.2)
CO2: 19 mmol/L — ABNORMAL LOW (ref 20–29)
Calcium: 9.3 mg/dL (ref 8.7–10.2)
Chloride: 103 mmol/L (ref 96–106)
Creatinine, Ser: 0.82 mg/dL (ref 0.57–1.00)
Globulin, Total: 2.7 g/dL (ref 1.5–4.5)
Glucose: 79 mg/dL (ref 70–99)
Potassium: 4.1 mmol/L (ref 3.5–5.2)
Sodium: 139 mmol/L (ref 134–144)
Total Protein: 7.1 g/dL (ref 6.0–8.5)
eGFR: 88 mL/min/1.73 (ref >59)

## 2023-11-22 LAB — HEMOGLOBIN A1C
Est. average glucose Bld gHb Est-mCnc: 111 mg/dL
Hgb A1c MFr Bld: 5.5 % (ref 4.8–5.6)

## 2023-11-22 LAB — INSULIN, RANDOM: INSULIN: 13 u[IU]/mL (ref 2.6–24.9)

## 2023-11-22 LAB — MAGNESIUM: Magnesium: 2.1 mg/dL (ref 1.6–2.3)

## 2023-11-22 LAB — TSH: TSH: 2.25 u[IU]/mL (ref 0.450–4.500)

## 2023-11-22 LAB — VITAMIN D 25 HYDROXY (VIT D DEFICIENCY, FRACTURES): Vit D, 25-Hydroxy: 39.7 ng/mL (ref 30.0–100.0)

## 2023-11-22 LAB — VITAMIN B12: Vitamin B-12: 636 pg/mL (ref 232–1245)

## 2023-12-01 ENCOUNTER — Other Ambulatory Visit (HOSPITAL_COMMUNITY)
Admission: RE | Admit: 2023-12-01 | Discharge: 2023-12-01 | Disposition: A | Source: Ambulatory Visit | Attending: Internal Medicine | Admitting: Internal Medicine

## 2023-12-01 ENCOUNTER — Encounter: Payer: Self-pay | Admitting: Internal Medicine

## 2023-12-01 ENCOUNTER — Ambulatory Visit: Admitting: Internal Medicine

## 2023-12-01 VITALS — BP 110/76 | HR 76 | Temp 98.0°F | Ht 70.5 in | Wt 259.5 lb

## 2023-12-01 DIAGNOSIS — R519 Headache, unspecified: Secondary | ICD-10-CM

## 2023-12-01 DIAGNOSIS — N898 Other specified noninflammatory disorders of vagina: Secondary | ICD-10-CM | POA: Diagnosis present

## 2023-12-01 DIAGNOSIS — E7800 Pure hypercholesterolemia, unspecified: Secondary | ICD-10-CM

## 2023-12-01 DIAGNOSIS — R739 Hyperglycemia, unspecified: Secondary | ICD-10-CM

## 2023-12-01 DIAGNOSIS — F439 Reaction to severe stress, unspecified: Secondary | ICD-10-CM | POA: Diagnosis not present

## 2023-12-01 DIAGNOSIS — G4730 Sleep apnea, unspecified: Secondary | ICD-10-CM | POA: Diagnosis not present

## 2023-12-01 MED ORDER — ESOMEPRAZOLE MAGNESIUM 40 MG PO CPDR: DELAYED_RELEASE_CAPSULE | ORAL | 1 refills | Status: AC

## 2023-12-01 NOTE — Progress Notes (Unsigned)
 Subjective:    Patient ID: Melissa Higgins, female    DOB: April 09, 1975, 48 y.o.   MRN: [id number]  Patient here for  Chief Complaint  Patient presents with   Medical Management of Chronic Issues    3 mth f/u & review labs   Vaginal Discharge    C/O vaginal discharge & odor x 1 week. Denies any urinary issues (no burning, itching, or frequency)    HPI Here for a scheduled follow up - follow up regarding increased stress, headaches and sleep apnea. Being followed through healthy weight and wellness. Seeing neurology - f/u headaches. Last seen 11/03/23 - recommended to start flexeril. Start magnesium  glycinate. Continue topamax. Feels flexeril is helping. She is currently taking trazodone  and magnesium  glycinate every other day. Using cpap. Evaluated by neuro ophthalmology.  No papilledema. Stable. No chest pain or sob reported. Does report vaginal discharge and odor.    Past Medical History:  Diagnosis Date   Allergy    Anxiety    Back pain    C. difficile colitis    augment use   Depression    Dysmenorrhea    Environmental allergies    allergy shots   GERD (gastroesophageal reflux disease)    History of frequent urinary tract infections    Hypercholesterolemia    Sleep apnea    CPAP used at home   Vitamin D  deficiency    Past Surgical History:  Procedure Laterality Date   COLONOSCOPY WITH PROPOFOL  N/A 01/18/2019   Procedure: COLONOSCOPY WITH PROPOFOL ;  Surgeon: Therisa Bi, MD;  Location: [facility] ENDOSCOPY;  Service: Gastroenterology;  Laterality: N/A;   NASAL SINUS SURGERY  2003   UTERINE FIBROID SURGERY  2019   Family History  Problem Relation Age of Onset   Hyperlipidemia Mother    Arthritis Mother    Anxiety disorder Mother    Hypertension Father    Asthma Father    Hyperlipidemia Father    Allergies Father    Brain cancer Father 77   Cancer Father    Anxiety disorder Father    Cancer Paternal Grandmother    Cancer Paternal Grandfather    Migraines Maternal  Aunt    Depression Paternal Aunt    Breast cancer Neg Hx    Social History   Socioeconomic History   Marital status: Divorced    Spouse name: Not on file   Number of children: 0   Years of education: college   Highest education level: Master's degree (e.g., MA, MS, MEng, MEd, MSW, MBA)  Occupational History   Occupation: Social Research Officer, [employer]  Tobacco Use   Smoking status: Never   Smokeless tobacco: Never  Vaping Use   Vaping status: Never Used  Substance and Sexual Activity   Alcohol use: Not Currently    Comment: occasional   Drug use: No   Sexual activity: Yes    Birth control/protection: None  Other Topics Concern   Not on file  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   Social Drivers of Corporate Investment Banker Strain: Low Risk  (11/28/2023)   Overall Financial Resource Strain (CARDIA)    Difficulty of Paying Living Expenses: Not hard at all  Food Insecurity: No Food Insecurity (11/28/2023)   Hunger Vital Sign    Worried About Running Out of Food in the Last Year: Never true    Ran Out of Food in the Last Year: Never true  Transportation Needs: No  Transportation Needs (11/28/2023)   PRAPARE - Administrator, Civil Service (Medical): No    Lack of Transportation (Non-Medical): No  Physical Activity: Insufficiently Active (11/28/2023)   Exercise Vital Sign    Days of Exercise per Week: 3 days    Minutes of Exercise per Session: 10 min  Stress: Stress Concern Present (11/28/2023)   [employer] of Occupational Health - Occupational Stress Questionnaire    Feeling of Stress: To some extent  Social Connections: Moderately Isolated (11/28/2023)   Social Connection and Isolation Panel    Frequency of Communication with Friends and Family: Three times a week    Frequency of Social Gatherings with Friends and Family: Twice a week    Attends Religious Services: 1 to 4 times per year    Active Member of [employer] or  Organizations: No    Attends Engineer, Structural: Not on file    Marital Status: Divorced     Review of Systems  Constitutional:  Negative for appetite change and unexpected weight change.  HENT:  Negative for congestion and sinus pressure.   Respiratory:  Negative for cough, chest tightness and shortness of breath.   Cardiovascular:  Negative for chest pain, palpitations and leg swelling.  Gastrointestinal:  Negative for abdominal pain, diarrhea, nausea and vomiting.  Genitourinary:  Negative for difficulty urinating and dysuria.  Musculoskeletal:  Negative for joint swelling and myalgias.  Skin:  Negative for color change and rash.  Neurological:  Negative for dizziness and headaches.  Psychiatric/Behavioral:  Negative for agitation and dysphoric mood.        Objective:     BP 110/76   Pulse 76   Temp 98 F (36.7 C) (Oral)   Ht 5' 10.5 (1.791 m)   Wt 259 lb 8 oz (117.7 kg)   LMP 06/16/2023   SpO2 97%   BMI 36.71 kg/m  Wt Readings from Last 3 Encounters:  12/01/23 259 lb 8 oz (117.7 kg)  11/21/23 260 lb (117.9 kg)  10/03/23 270 lb (122.5 kg)    Physical Exam Vitals reviewed.  Constitutional:      General: She is not in acute distress.    Appearance: Normal appearance.  HENT:     Head: Normocephalic and atraumatic.     Right Ear: External ear normal.     Left Ear: External ear normal.     Mouth/Throat:     Pharynx: No oropharyngeal exudate or posterior oropharyngeal erythema.  Eyes:     General: No scleral icterus.       Right eye: No discharge.        Left eye: No discharge.     Conjunctiva/sclera: Conjunctivae normal.  Neck:     Thyroid : No thyromegaly.  Cardiovascular:     Rate and Rhythm: Normal rate and regular rhythm.  Pulmonary:     Effort: No respiratory distress.     Breath sounds: Normal breath sounds. No wheezing.  Abdominal:     General: Bowel sounds are normal.     Palpations: Abdomen is soft.     Tenderness: There is no  abdominal tenderness.  Musculoskeletal:        General: No swelling or tenderness.     Cervical back: Neck supple. No tenderness.  Lymphadenopathy:     Cervical: No cervical adenopathy.  Skin:    Findings: No erythema or rash.  Neurological:     Mental Status: She is alert.  Psychiatric:        Mood  and Affect: Mood normal.        Behavior: Behavior normal.         Outpatient Encounter Medications as of 12/01/2023  Medication Sig   cetirizine (ZYRTEC) 10 MG tablet Take 10 mg by mouth daily.   cyclobenzaprine (FLEXERIL) 5 MG tablet Take 5 mg by mouth.   EPINEPHrine 0.3 mg/0.3 mL IJ SOAJ injection    meloxicam  (MOBIC ) 15 MG tablet TAKE 1 TABLET(15 MG) BY MOUTH DAILY   Sertraline  HCl 150 MG CAPS Take 150 mg by mouth daily.   SUMAtriptan (IMITREX) 50 MG tablet Take 50 mg by mouth as needed.   topiramate (TOPAMAX) 25 MG tablet Take 25 mg by mouth daily. 2 weeks at 25 then 50 mg (Patient taking differently: Take 50 mg by mouth daily. 2 weeks at 25 then 50 mg)   traZODone  (DESYREL ) 50 MG tablet Take 50 mg by mouth at bedtime.   esomeprazole  (NEXIUM ) 40 MG capsule TAKE 1 CAPSULE BY MOUTH TWICE DAILY BEFORE MEALS   gentamicin ointment (GARAMYCIN) 0.1 % Apply topically 3 (three) times daily. (Patient not taking: Reported on 12/01/2023)   [DISCONTINUED] esomeprazole  (NEXIUM ) 40 MG capsule TAKE 1 CAPSULE BY MOUTH TWICE DAILY BEFORE MEALS   [DISCONTINUED] pseudoephedrine (SUDAFED) 120 MG 12 hr tablet Take 120 mg by mouth daily. (Patient not taking: Reported on 12/01/2023)   No facility-administered encounter medications on file as of 12/01/2023.     Lab Results  Component Value Date   WBC 11.3 (H) 11/21/2023   HGB 15.6 11/21/2023   HCT 47.5 (H) 11/21/2023   PLT 304 11/21/2023   GLUCOSE 79 11/21/2023   CHOL 270 (H) 11/21/2023   TRIG 154 (H) 11/21/2023   HDL 55 11/21/2023   LDLCALC 187 (H) 11/21/2023   ALT 14 11/21/2023   AST 13 11/21/2023   NA 139 11/21/2023   K 4.1 11/21/2023    CL 103 11/21/2023   CREATININE 0.82 11/21/2023   BUN 24 11/21/2023   CO2 19 (L) 11/21/2023   TSH 2.250 11/21/2023   HGBA1C 5.5 11/21/2023    MM 3D SCREENING MAMMOGRAM BILATERAL BREAST Result Date: 08/14/2023 CLINICAL DATA:  Screening. EXAM: DIGITAL SCREENING BILATERAL MAMMOGRAM WITH TOMOSYNTHESIS AND CAD TECHNIQUE: Bilateral screening digital craniocaudal and mediolateral oblique mammograms were obtained. Bilateral screening digital breast tomosynthesis was performed. The images were evaluated with computer-aided detection. COMPARISON:  Previous exam(s). ACR Breast Density Category c: The breasts are heterogeneously dense, which may obscure small masses. FINDINGS: There are no findings suspicious for malignancy. IMPRESSION: No mammographic evidence of malignancy. A result letter of this screening mammogram will be mailed directly to the patient. RECOMMENDATION: Screening mammogram in one year. (Code:SM-B-01Y) BI-RADS CATEGORY  1: Negative. Electronically Signed   By: Alm Parkins M.D.   On: 08/14/2023 07:55       Assessment & Plan:  Vaginal discharge Assessment & Plan: Vaginal discharge. KOH/wet prep obtained. Wait for results prior to treatment.   Orders: -     Cervicovaginal ancillary only  Hypercholesterolemia Assessment & Plan: Low cholesterol diet and exercise. Follow lipid panel.    Stress Assessment & Plan: Increased stress. Continues on sertraline  and trazodone . F/u regarding psychiatry referral.    Sleep apnea, unspecified type Assessment & Plan: Continue f/u with pulmonary. Continue cpap.    Hyperglycemia Assessment & Plan: Low carb diet and exercise. Follow met b and A1c.    Acute nonintractable headache, unspecified headache type Assessment & Plan: Seeing neurology - f/u headaches. Last seen 11/03/23 - recommended  to start flexeril. Start magnesium  glycinate. Continue topamax. Feels flexeril is helping. She is currently taking trazodone  and magnesium  glycinate  every other day. Using cpap. Evaluated by neuro ophthalmology.  No papilledema. Stable.   Other orders -     Esomeprazole  Magnesium ; TAKE 1 CAPSULE BY MOUTH TWICE DAILY BEFORE MEALS  Dispense: 180 capsule; Refill: 1     Allena Hamilton, MD

## 2023-12-03 ENCOUNTER — Encounter: Payer: Self-pay | Admitting: Internal Medicine

## 2023-12-03 NOTE — Assessment & Plan Note (Signed)
 Increased stress. Continues on sertraline  and trazodone . F/u regarding psychiatry referral.

## 2023-12-03 NOTE — Assessment & Plan Note (Signed)
 Vaginal discharge. KOH/wet prep obtained. Wait for results prior to treatment.

## 2023-12-03 NOTE — Assessment & Plan Note (Signed)
 Low-carb diet and exercise.  Follow met b and A1c.

## 2023-12-03 NOTE — Assessment & Plan Note (Signed)
 Continue f/u with pulmonary. Continue cpap.

## 2023-12-03 NOTE — Assessment & Plan Note (Signed)
 Low cholesterol diet and exercise.  Follow lipid panel.

## 2023-12-03 NOTE — Assessment & Plan Note (Signed)
 Seeing neurology - f/u headaches. Last seen 11/03/23 - recommended to start flexeril. Start magnesium  glycinate. Continue topamax. Feels flexeril is helping. She is currently taking trazodone  and magnesium  glycinate every other day. Using cpap. Evaluated by neuro ophthalmology.  No papilledema. Stable.

## 2023-12-04 LAB — CERVICOVAGINAL ANCILLARY ONLY
Bacterial Vaginitis (gardnerella): NEGATIVE
Candida Glabrata: NEGATIVE
Candida Vaginitis: NEGATIVE
Comment: NEGATIVE
Comment: NEGATIVE
Comment: NEGATIVE

## 2023-12-05 ENCOUNTER — Ambulatory Visit: Payer: Self-pay | Admitting: Internal Medicine

## 2023-12-11 ENCOUNTER — Other Ambulatory Visit (INDEPENDENT_AMBULATORY_CARE_PROVIDER_SITE_OTHER): Payer: Self-pay | Admitting: Family Medicine

## 2023-12-11 ENCOUNTER — Ambulatory Visit (INDEPENDENT_AMBULATORY_CARE_PROVIDER_SITE_OTHER): Payer: Self-pay | Admitting: Family Medicine

## 2023-12-11 ENCOUNTER — Encounter (INDEPENDENT_AMBULATORY_CARE_PROVIDER_SITE_OTHER): Payer: Self-pay | Admitting: Family Medicine

## 2023-12-11 VITALS — BP 110/75 | HR 79 | Temp 98.2°F | Ht 70.5 in | Wt 265.0 lb

## 2023-12-11 DIAGNOSIS — E559 Vitamin D deficiency, unspecified: Secondary | ICD-10-CM

## 2023-12-11 DIAGNOSIS — E65 Localized adiposity: Secondary | ICD-10-CM

## 2023-12-11 DIAGNOSIS — E669 Obesity, unspecified: Secondary | ICD-10-CM

## 2023-12-11 DIAGNOSIS — R7303 Prediabetes: Secondary | ICD-10-CM

## 2023-12-11 DIAGNOSIS — F39 Unspecified mood [affective] disorder: Secondary | ICD-10-CM

## 2023-12-11 DIAGNOSIS — E7800 Pure hypercholesterolemia, unspecified: Secondary | ICD-10-CM

## 2023-12-11 DIAGNOSIS — Z6839 Body mass index (BMI) 39.0-39.9, adult: Secondary | ICD-10-CM

## 2023-12-11 DIAGNOSIS — G4730 Sleep apnea, unspecified: Secondary | ICD-10-CM

## 2023-12-11 MED ORDER — METFORMIN HCL 500 MG PO TABS: ORAL_TABLET | ORAL | 1 refills | Status: DC

## 2023-12-11 MED ORDER — VITAMIN D3 25 MCG (1000 UT) PO CAPS: 2000.0000 [IU] | ORAL_CAPSULE | Freq: Every day | ORAL | Status: AC

## 2023-12-11 NOTE — Progress Notes (Signed)
 Barnie DOROTHA Jenkins, D.O.  ABFM, ABOM Clinical Bariatric Medicine Physician  Office located at: [address] [place], [place]  [address]    FOR THE CHRONIC DISEASE OF OBESITY:  Obesity (BMI 30-39.9) BMI 39.0-39.9,adult- current BMI 37.47  Chief complaint: Obesity Melissa Higgins is here to discuss her progress with her obesity treatment plan.   History of present illness / Interval history:  Melissa Higgins is here today for her first follow-up office visit since starting the program with us . Since last OV, pt is up  6 lbs.  Reports she has been feeling increased hunger on the meal plan.    11/21/23 07:00 12/11/23 14:00   Body Fat % 45.1 % 46.5 %  Muscle Mass (lbs) 135.8 lbs 134.6 lbs  Fat Mass (lbs) 117.6 lbs 123 lbs  Total Body Water (lbs) 90.6 lbs 96 lbs  Visceral Fat Rating  12 13  Counseling done today with patient on how various foods will affect these numbers and how to maximize fat loss success   Total lbs lost to date: +5 lbs Total Fat Mass in lbs lost to date: -5.2 Total weight loss percentage to date: +1.92 %   Nutrition Therapy She is on the Category 2 plan w/ B/L options with the option to Journal breakfast 200-250 calories with 20+ g protein and states she is following her eating plan approximately 50 % of the time.   - Tracking Calories/Macros: yes  - Eating More Whole Foods: no -    - Adequate Protein Intake: no -    - Adequate Water Intake: no -    - Skipping Meals: no -    - Sleeping 7-9 Hours/ Night: yes  Melissa Higgins is currently in the action stage of change. As such, her goal is to continue weight management plan.  She has agreed to: continue current plan   Physical Activity  Melissa Higgins is walking, 15 minutes, 3 days per wk currently  Melissa Higgins has been advised to work up to 300-450 minutes of moderate intensity aerobic activity a week and strengthening exercises 2-3 times per week for cardiovascular health, weight loss maintenance  and preservation of muscle mass.  She has agreed to : Continue current level of physical activity , Think about enjoyable ways to increase daily physical activity and overcoming barriers to exercise, and Increase physical activity in their day and reduce sedentary time (increase NEAT).   Behavioral Modifications Evidence-based interventions for health behavior change were utilized today including the discussion of  1) self monitoring techniques:  Measuring protein intake 2) problem-solving barriers:  none 3) self care:  exercise 4) SMART goals for next OV:  Increase protein intake  Regarding patient's less desirable eating habits and patterns, we employed the technique of small changes.   We discussed the following today: increasing lean protein intake to established goals, decreasing simple carbohydrates , increasing water intake , better snacking choices, and focusing on food with a 10:1 ratio of calories: grams of protein Additional resources provided today: Handout on traveling and holiday eating strategies, Handout on Healthy [employer] , Handout on Pre-Diabetes education , Handout on insulin  resistance education, and Handout on benefits of metformin  for weight loss   Medical Interventions/ Pharmacotherapy Previous Bariatric surgery: none Pharmacotherapy for weight loss: She is currently taking Topamax 25 mg daily for medical weight loss.    We discussed various medication options to help Melissa Higgins with her weight loss efforts and we both agreed to : See Pre-DM  note   OBESITY RELATED CONDITIONS ADDRESSED TODAY: Extensive review of labs performed today that were drawn at the new patient office visit on 11/21/2019.    Pre-diabetes Assessment & Plan Lab Results  Component Value Date   HGBA1C 5.5 11/21/2023   HGBA1C 5.7 03/05/2020   HGBA1C 5.6 04/19/2019   INSULIN  13.0 11/21/2023   Lab Results  Component Value Date   CREATININE 0.82 11/21/2023   BUN 24 11/21/2023   NA  139 11/21/2023   K 4.1 11/21/2023   CL 103 11/21/2023   CO2 19 (L) 11/21/2023   Lab Results  Component Value Date   WBC 11.3 (H) 11/21/2023   HGB 15.6 11/21/2023   HCT 47.5 (H) 11/21/2023   MCV 94 11/21/2023   PLT 304 11/21/2023   Lab Results  Component Value Date   FOLATE 17.7 11/21/2023   Lab Results  Component Value Date   VITAMINB12 636 11/21/2023   Lab Results  Component Value Date   TSH 2.250 11/21/2023   FREET4 1.11 11/21/2023  This is a new diagnosis for Melissa Higgins. I counseled patient on pathophysiology of the disease process of I.R. and Pre-DM. Reviewed labs with pt from 11/21/2023, A1c improved from 5.7 on 03/05/2020 to 5.5. Fasting insulin  is above goal at 13; optimal <5. Explained role of simple carbs and insulin  levels on hunger and cravings. Continue to decrease simple carbs/ sugars; increase fiber and proteins -> follow her meal plan. Encouraged to eat every 3 hours or so to help control blood sugar levels and help decrease the risk of progressing to diabetes. We will recheck A1c and fasting insulin  level in approximately 3 months.   - In addition, we discussed the risks and benefits of Metformin   which will help manage hunger and cravings and decrease risk of progression to diabetes. Discussed the risks and benefits of medication with pt. Start Metformin  500 mg half a tab twice daily.  Kidney function reviewed with pt and is WNL. BUN is increased at 24, which could indicate dehydration. Encouraged to increase water intake to half body weight in ounces.  Cont implementing a low-sodium, heart-healthy diet. Increase exercise as able.   WBC and neutrophils are slightly elevated which could be due to allergies or a cold. Will recheck in a couple of months. Hemoglobin and hematocrit are WNL.   B9, B12, and Thyroid  levels are WNL. No acute concerns. Cont to follow prudent nutritional plan. Will continue to monitor.     Hypercholesterolemia Assessment & Plan Lab  Results  Component Value Date   CHOL 270 (H) 11/21/2023   HDL 55 11/21/2023   LDLCALC 187 (H) 11/21/2023   TRIG 154 (H) 11/21/2023   CHOLHDL 4.9 (H) 11/21/2023      Component Value Date/Time   PROT 7.1 11/21/2023 1000   ALBUMIN 4.4 11/21/2023 1000   AST 13 11/21/2023 1000   ALT 14 11/21/2023 1000   ALKPHOS 117 (H) 11/21/2023 1000   BILITOT 0.5 11/21/2023 1000   BILIDIR 0.1 04/19/2019 1146  Currently managed by dietary and lifestyle interventions. Reviewed labs with pt today from 11/21/2023. HDL improved from 44.4 3 months ago to 50. LDL has worsened from 160 3 months ago to 187 on 11/21/2023; optimal <100. Trigs worsened from 105 3 months ago to 154 today; optimal <150.  Alkphos is slightly above goal at 117 this could be due to bone or liver enzymes. All other liver enzymes are WNL, so its likely elevated due to bone alkaline phophatase.  Cont to follow prudent nutritional meal plan and decrease saturated/trans fats. Increase exercise as able.     Sleep apnea, unspecified type Assessment & Plan Diagnosed with mild OSA in 2020 with an AHI of 12.8/ hour and was placed an auto-CPAP. She did a repeat study in 04/15/23 with an AHI of 6.3/hour. Since then, she restarted her CPAP in April of 2025. Reports she has been compliant with her mask but still reports poor sleep quality. She has an appt with Pulmonology in the near future. Cont compliance with CPAP machine and obtain 7-9 hours of sleep per night.    Mood disorder - Emotional Eating Assessment & Plan Currently taking Sertraline  150 mg once daily with good compliance and tolerance. Mood is well controlled today. No acute concerns. Cont prudent nutritional plan to reduce any cravings. Obtain 7-9 hours of sleep per night and implement stress management strategies.     Vitamin D  deficiency Assessment & Plan Lab Results  Component Value Date   VD25OH 39.7 11/21/2023  Is currently taking an OTC Vit D 1000 units daily with good  compliance and tolerance. Vit D level is sub optimal at 39.7; optimal 50-70. I reviewed possible symptoms of low Vitamin D :  low energy, depressed mood, muscle aches, joint aches, osteoporosis etc. with patient. Increase OTC Vit D to 2,000 units daily. Will recheck levels in 3-4 months.     Visceral obesity Current visceral fat rating: 13.  The visceral fat rating should be < 12 in a female.  Visceral adipose tissue is a hormonally active component of total body fat. This body composition phenotype is associated with medical disorders such as metabolic syndrome, cardiovascular disease and several malignancies including prostate, breast, and colorectal cancers. -Starting goal: Lose 7-10% of weight via prudent nutritional plan and lifestyle changes.     No orders of the defined types were placed in this encounter.   There are no discontinued medications.   Meds ordered this encounter  Medications   Cholecalciferol (VITAMIN D3) 25 MCG (1000 UT) CAPS    Sig: Take 2 capsules (2,000 Units total) by mouth daily.   metFORMIN  (GLUCOPHAGE ) 500 MG tablet    Sig: 1/2 tab bid with meals    Dispense:  30 tablet    Refill:  1     FOLLOW UP:   No follow-ups on file.*** She was informed of the importance of frequent follow up visits to maximize her success with intensive lifestyle modifications for her multiple health conditions.   Weight Summary and Biometrics   Weight Lost Since Last Visit: 0lb  Weight Gained Since Last Visit: 5lb   Vitals Temp: 98.2 F (36.8 C) BP: 110/75 Pulse Rate: 79 SpO2: 99 %   Anthropometric Measurements Height: 5' 10.5 (1.791 m) Weight: 265 lb (120.2 kg) BMI (Calculated): 37.47 Weight at Last Visit: 260lb Weight Lost Since Last Visit: 0lb Weight Gained Since Last Visit: 5lb Starting Weight: 260lb Total Weight Loss (lbs): 0 lb (0 kg) Peak Weight: 275lb   Body Composition  Body Fat %: 46.5 % Fat Mass (lbs): 123 lbs Muscle Mass (lbs): 134.6  lbs Total Body Water (lbs): 96 lbs Visceral Fat Rating : 13   Other Clinical Data RMR: [id number] Fasting: no Labs: no Today's Visit #: 2 Starting Date: 11/21/23 Comments: second visit     Objective:  Review of Systems:  Pertinent positives were addressed with patient today.   Reviewed by clinician on day of visit: allergies, medications, problem list, medical history, surgical history, family  history, social history, and previous encounter notes.  PHYSICAL EXAM: Blood pressure 110/75, pulse 79, temperature 98.2 F (36.8 C), height 5' 10.5 (1.791 m), weight 265 lb (120.2 kg), last menstrual period 06/16/2023, SpO2 99%. Body mass index is 37.49 kg/m. General: she is overweight, cooperative and in no acute distress.   HEENT: EOMI, sclerae are anicteric. Lungs: Normal breathing effort, no conversational dyspnea. M-Sk:  Normal gross ROM * 4 extremities  PSYCH: Has normal mood, affect and thought process. Neurologic: No gross sensory or motor deficits. Well developed, A and O * 3  DIAGNOSTIC DATA REVIEWED:  BMET    Component Value Date/Time   NA 139 11/21/2023 1000   K 4.1 11/21/2023 1000   CL 103 11/21/2023 1000   CO2 19 (L) 11/21/2023 1000   GLUCOSE 79 11/21/2023 1000   GLUCOSE 100 (H) 08/18/2023 0932   BUN 24 11/21/2023 1000   CREATININE 0.82 11/21/2023 1000   CREATININE 0.72 11/25/2016 1608   CALCIUM 9.3 11/21/2023 1000   GFRNONAA >60 01/25/2023 1631   Lab Results  Component Value Date   HGBA1C 5.5 11/21/2023   HGBA1C 5.9 05/02/2018   Lab Results  Component Value Date   INSULIN  13.0 11/21/2023   Lab Results  Component Value Date   TSH 2.250 11/21/2023   CBC    Component Value Date/Time   WBC 11.3 (H) 11/21/2023 1000   WBC 9.8 04/04/2023 1625   RBC 5.04 11/21/2023 1000   RBC 4.44 04/04/2023 1625   HGB 15.6 11/21/2023 1000   HCT 47.5 (H) 11/21/2023 1000   PLT 304 11/21/2023 1000   MCV 94 11/21/2023 1000   MCH 31.0 11/21/2023 1000   MCH 29.1  01/25/2023 1631   MCHC 32.8 11/21/2023 1000   MCHC 32.9 04/04/2023 1625   RDW 12.8 11/21/2023 1000   Iron Studies    Component Value Date/Time   IRON 28 (L) 11/23/2018 0957   FERRITIN 18.2 04/19/2019 1146   IRONPCTSAT 5.6 (L) 11/23/2018 0957   Lipid Panel     Component Value Date/Time   CHOL 270 (H) 11/21/2023 1000   TRIG 154 (H) 11/21/2023 1000   HDL 55 11/21/2023 1000   CHOLHDL 4.9 (H) 11/21/2023 1000   CHOLHDL 5 08/18/2023 0932   VLDL 21.0 08/18/2023 0932   LDLCALC 187 (H) 11/21/2023 1000   LDLCALC 155 (H) 11/25/2016 1608   Hepatic Function Panel     Component Value Date/Time   PROT 7.1 11/21/2023 1000   ALBUMIN 4.4 11/21/2023 1000   AST 13 11/21/2023 1000   ALT 14 11/21/2023 1000   ALKPHOS 117 (H) 11/21/2023 1000   BILITOT 0.5 11/21/2023 1000   BILIDIR 0.1 04/19/2019 1146      Component Value Date/Time   TSH 2.250 11/21/2023 1000   Nutritional Lab Results  Component Value Date   VD25OH 39.7 11/21/2023     Attestations:   I, ***, acting as a stage manager for Barnie Jenkins, DO., have compiled all relevant documentation for today's office visit on behalf of Barnie Jenkins, DO, while in the presence of [employer], DO.  I have spent *** minutes in the care of the patient today.  *** minutes was spent on face-to-face counseling and reviewing listed medical problems above as outlined in office visit note, providing nutritional and behavioral counseling as outlined in obesity care plan, independently interpreting results and goals of care, see listed medical problems, and discussing biometric information and progress. We reviewed *** meal plan and discussed how the foods ***  eating is affecting each one of *** labs. Pt educated on why we want *** to eat various foods in various amounts and has a better understanding of the nutritional plan because of this. All *** questions were answered today. *** minutes was spent on pre-chart review and additional  documentation after the visit.   I have reviewed the above documentation for accuracy and completeness, and I agree with the above. Barnie JINNY Jenkins, D.O.  The 21st Century Cures Act was signed into law in 2016 which includes the topic of electronic health records.  This provides immediate access to information in MyChart.  This includes consultation notes, operative notes, office notes, lab results and pathology reports.  If you have any questions about what you read please let us  know at your next visit so we can discuss your concerns and take corrective action if need be.  We are right here with you.

## 2023-12-11 NOTE — Patient Instructions (Signed)
 The 10-year ASCVD risk score (Arnett DK, et al., 2019) is: 1.3%   Values used to calculate the score:     Age: 48 years     Clincally relevant sex: Female     Is Non-Hispanic African American: No     Diabetic: No     Tobacco smoker: No     Systolic Blood Pressure: 110 mmHg     Is BP treated: No     HDL Cholesterol: 55 mg/dL     Total Cholesterol: 270 mg/dL

## 2023-12-12 ENCOUNTER — Ambulatory Visit: Admitting: Internal Medicine

## 2023-12-14 ENCOUNTER — Ambulatory Visit: Admitting: Sleep Medicine

## 2023-12-20 ENCOUNTER — Encounter: Payer: Self-pay | Admitting: Internal Medicine

## 2023-12-20 NOTE — Telephone Encounter (Signed)
 Just let me know if I need to do anything more. Thanks.

## 2023-12-27 ENCOUNTER — Ambulatory Visit: Admitting: Internal Medicine

## 2024-01-01 ENCOUNTER — Ambulatory Visit: Admitting: Sleep Medicine

## 2024-01-01 ENCOUNTER — Encounter: Payer: Self-pay | Admitting: Sleep Medicine

## 2024-01-01 ENCOUNTER — Telehealth: Payer: Self-pay

## 2024-01-01 VITALS — BP 120/80 | HR 79 | Temp 98.1°F | Ht 70.5 in | Wt 260.0 lb

## 2024-01-01 DIAGNOSIS — G4733 Obstructive sleep apnea (adult) (pediatric): Secondary | ICD-10-CM

## 2024-01-01 DIAGNOSIS — E669 Obesity, unspecified: Secondary | ICD-10-CM | POA: Diagnosis not present

## 2024-01-01 DIAGNOSIS — Z6836 Body mass index (BMI) 36.0-36.9, adult: Secondary | ICD-10-CM | POA: Diagnosis not present

## 2024-01-01 NOTE — Telephone Encounter (Signed)
 Sent pt a message per Dr. Chase request on how to access and adjust her tube temperature settings.

## 2024-01-01 NOTE — Progress Notes (Signed)
 "      Name:Melissa Higgins MRN: [id number] DOB: 04-04-1975   CHIEF COMPLAINT:  CPAP F/U   HISTORY OF PRESENT ILLNESS:  Melissa Higgins is a 48 y.o. w/ a h/o OSA, obesity, GERD, anxiety who presents for CPAP follow up visit. Reports increased CPAP compliance since last visit, states that she feels unrefreshed upon awakening most days. Reports increased nasal congestion. She is currently using the [employer] FFM.    EPWORTH SLEEP SCORE    03/08/2018    4:00 PM  Results of the Epworth flowsheet  Sitting and reading 1  Watching TV 0  Sitting, inactive in a public place (e.g. a theatre or a meeting) 0  As a passenger in a car for an hour without a break 2  Lying down to rest in the afternoon when circumstances permit 2  Sitting and talking to someone 0  Sitting quietly after a lunch without alcohol 0  In a car, while stopped for a few minutes in traffic 0  Total score 5    PAST MEDICAL HISTORY :   has a past medical history of Allergy, Anxiety, Back pain, C. difficile colitis, Depression, Dysmenorrhea, Environmental allergies, GERD (gastroesophageal reflux disease), History of frequent urinary tract infections, Hypercholesterolemia, Sleep apnea, and Vitamin D  deficiency.  has a past surgical history that includes Nasal sinus surgery (2003); Uterine fibroid surgery (2019); and Colonoscopy with propofol  (N/A, 01/18/2019). Prior to Admission medications   Medication Sig Start Date End Date Taking? Authorizing Provider  albuterol  (VENTOLIN  HFA) 108 (90 Base) MCG/ACT inhaler Inhale 2 puffs into the lungs every 6 (six) hours as needed for wheezing or shortness of breath. 10/28/22  Yes Glendia Shad, MD  cetirizine (ZYRTEC) 10 MG tablet Take 10 mg by mouth daily.   Yes [provider]  EPINEPHrine 0.3 mg/0.3 mL IJ SOAJ injection  07/06/15  Yes [provider]  esomeprazole  (NEXIUM ) 40 MG capsule TAKE 1 CAPSULE BY MOUTH TWICE DAILY BEFORE MEALS 05/13/22  Yes Glendia Shad, MD   gentamicin ointment (GARAMYCIN) 0.1 % Apply topically 3 (three) times daily. 12/22/21  Yes [provider]  meloxicam  (MOBIC ) 15 MG tablet TAKE 1 TABLET(15 MG) BY MOUTH DAILY 11/14/22  Yes Janit Thresa HERO, DPM  montelukast (SINGULAIR) 10 MG tablet Take 10 mg by mouth at bedtime. 04/07/23  Yes [provider]  mupirocin  ointment (BACTROBAN ) 2 % Apply 1 Application topically 2 (two) times daily. 12/02/22  Yes Glendia Shad, MD  pseudoephedrine (SUDAFED) 120 MG 12 hr tablet Take 120 mg by mouth daily.   Yes [provider]  sertraline  (ZOLOFT ) 100 MG tablet TAKE 1 TABLET(100 MG) BY MOUTH DAILY 01/13/20  Yes Glendia Shad, MD  traZODone  (DESYREL ) 50 MG tablet Take 50-100 mg by mouth at bedtime as needed. 04/21/20  Yes [provider]   Allergies  Allergen Reactions   Amoxicillin-Pot Clavulanate Diarrhea   Nitrofurantoin Nausea And Vomiting and Other (See Comments)    Other reaction(s): Unknown  nitrofurantoin  nitrofurantoin, macrocrystals / nitrofurantoin, monohydrate    FAMILY HISTORY:  family history includes Allergies in her father; Anxiety disorder in her father and mother; Arthritis in her mother; Asthma in her father; Brain cancer (age of onset: 46) in her father; Cancer in her father, paternal grandfather, and paternal grandmother; Depression in her paternal aunt; Hyperlipidemia in her father and mother; Hypertension in her father; Migraines in her maternal aunt. SOCIAL HISTORY:  reports that she has never smoked. She has never used smokeless tobacco. She reports  that she does not currently use alcohol. She reports that she does not use drugs.   Review of Systems:  Gen:  Denies  fever, sweats, chills weight loss  HEENT: Denies blurred vision, double vision, ear pain, eye pain, hearing loss, nose bleeds, sore throat Cardiac:  No dizziness, chest pain or heaviness, chest tightness,edema, No JVD Resp:   No cough, -sputum production, -shortness of  breath,-wheezing, -hemoptysis,  Gi: Denies swallowing difficulty, stomach pain, nausea or vomiting, diarrhea, constipation, bowel incontinence Gu:  Denies bladder incontinence, burning urine Ext:   Denies Joint pain, stiffness or swelling Skin: Denies  skin rash, easy bruising or bleeding or hives Endoc:  Denies polyuria, polydipsia , polyphagia or weight change Psych:   Denies depression, insomnia or hallucinations  Other:  All other systems negative  VITAL SIGNS: BP 120/80   Pulse 79   Temp 98.1 F (36.7 C)   Ht 5' 10.5 (1.791 m)   Wt 260 lb (117.9 kg)   LMP  (LMP Unknown)   SpO2 96%   BMI 36.78 kg/m    Physical Examination:   General Appearance: No distress  EYES PERRLA, EOM intact.   NECK Supple, No JVD Pulmonary: normal breath sounds, No wheezing.  CardiovascularNormal S1,S2.  No m/r/g.   Abdomen: Benign, Soft, non-tender. Skin:   warm, no rashes, no ecchymosis  Extremities: normal, no cyanosis, clubbing. Neuro:without focal findings,  speech normal  PSYCHIATRIC: Mood, affect within normal limits.   ASSESSMENT AND PLAN  OSA Counseled patient on increasing CPAP compliance. Due to mask discomfort, will try patient on the [employer] F30i FFM. Discussed the consequences of untreated sleep apnea. Advised not to drive drowsy for safety of patient and others. Will follow up in 3 months.     Obesity Counseled patient on diet and lifestyle modification.    Patient satisfied with Plan of action and management. All questions answered  I spent a total of 33 minutes reviewing chart data, face-to-face evaluation with the patient, counseling and coordination of care as detailed above.    Arno Cullers, M.D.  Sleep Medicine [facility]       "

## 2024-01-02 ENCOUNTER — Telehealth: Payer: Self-pay

## 2024-01-02 NOTE — Telephone Encounter (Signed)
 Copied from CRM [id number]. Topic: General - Other >> Jan 02, 2024  9:38 AM Devaughn RAMAN wrote: Reason for CRM: Pt seen on yesterday by Dr.Reddy and stated she needs an adapter and does not fit with her tubing for her mask, pt is inquiring if she can come by the office to pick up an adapter, contacted [facility] Burgin advised to send clinical CRM for f/u. Advised pt, pt was thankful and verbalized understanding.

## 2024-01-15 ENCOUNTER — Ambulatory Visit (INDEPENDENT_AMBULATORY_CARE_PROVIDER_SITE_OTHER): Admitting: Family Medicine

## 2024-01-15 ENCOUNTER — Encounter (INDEPENDENT_AMBULATORY_CARE_PROVIDER_SITE_OTHER): Payer: Self-pay | Admitting: Family Medicine

## 2024-01-15 VITALS — BP 137/83 | HR 69 | Temp 97.8°F | Ht 70.5 in | Wt 253.0 lb

## 2024-01-15 DIAGNOSIS — Z6839 Body mass index (BMI) 39.0-39.9, adult: Secondary | ICD-10-CM

## 2024-01-15 DIAGNOSIS — E669 Obesity, unspecified: Secondary | ICD-10-CM

## 2024-01-15 DIAGNOSIS — Z6835 Body mass index (BMI) 35.0-35.9, adult: Secondary | ICD-10-CM

## 2024-01-15 DIAGNOSIS — R7303 Prediabetes: Secondary | ICD-10-CM

## 2024-01-15 DIAGNOSIS — F5089 Other specified eating disorder: Secondary | ICD-10-CM

## 2024-01-15 DIAGNOSIS — E559 Vitamin D deficiency, unspecified: Secondary | ICD-10-CM | POA: Diagnosis not present

## 2024-01-15 DIAGNOSIS — F39 Unspecified mood [affective] disorder: Secondary | ICD-10-CM | POA: Diagnosis not present

## 2024-01-15 MED ORDER — METFORMIN HCL 500 MG PO TABS: ORAL_TABLET | ORAL | 0 refills | Status: AC

## 2024-01-15 NOTE — Progress Notes (Signed)
 "  Melissa Higgins, D.O.  ABFM, ABOM Specializing in Clinical Bariatric Medicine  Office located at: [address] [place], [place]  [address]      A) FOR THE CHRONIC DISEASE OF OBESITY: Obesity (BMI 30-39.9) Start BMI 36.78 BMI 39.0-39.9,adult- current BMI 35.78  Chief complaint: Obesity Melissa Higgins is here to discuss her progress with her obesity treatment plan.   History of present illness / Interval history:  Melissa Higgins is here today for her follow-up office visit.  Since last OV on 12/11/2023, pt is down 12 lbs.   Pt reports over the holidays she focused on hitting her protein goals and noticed herself practicing more mindful eating.   12/11/23 14:00 01/15/24 15:00   Body Fat % 46.5 % 45.4 %  Muscle Mass (lbs) 134.6 lbs 131.4 lbs  Fat Mass (lbs) 123 lbs 115.2 lbs  Total Body Water (lbs) 96 lbs 92.2 lbs  Visceral Fat Rating  13 12  Counseling done on how various foods will affect these numbers and how to maximize success   Total lbs lost to date: -7 lbs Total Fat Mass in lbs lost to date: -13 Total weight loss percentage to date: -2.69 %   Nutrition Therapy She is on the Category 2 plan w/ B/L options with the option to Journal breakfast 200-250 calories with 20+ g protein  and states she is following her eating plan approximately 80 % of the time.   - Tracking Calories/Macros: yes  - Eating More Whole Foods: no - ***  - Adequate Protein Intake: yes  - Adequate Water Intake: no - ***  - Skipping Meals: no - ***  - Sleeping 7-9 Hours/ Night: yes   Melissa Higgins is currently in the action stage of change. As such, her goal is to continue weight management plan.  She has agreed to: continue current plan   Physical Activity Pt is walking the dog 15  minutes 3-4 days per week   Melissa Higgins has been advised to work up to 300-450 minutes of moderate intensity aerobic activity a week and strengthening exercises 2-3 times per week for cardiovascular health,  weight loss maintenance and preservation of muscle mass.  She has agreed to : Continue current level of physical activity , Think about enjoyable ways to increase daily physical activity and overcoming barriers to exercise, Increase volume of physical activity to a goal of 240 minutes a week, and Combine aerobic and strengthening exercises for efficiency and improved cardiometabolic health.   Behavioral Modifications Evidence-based interventions for health behavior change were utilized today including the discussion of  1) self monitoring techniques:  tracking calories, measuring protein, practicing mindful eating 2) problem-solving barriers:  n/a 3) self care:  exercise, adequate sleep 4) SMART goals for next OV:  Increase exercise to walking for 30 minutes 2-3 days a week Regarding patient's less desirable eating habits and patterns, we employed the technique of small changes.   We discussed the following today: increasing lean protein intake to established goals, work on tracking and journaling calories using tracking application, keeping healthy foods at home, continue to work on implementation of reduced calorie nutritional plan, continue to practice mindfulness when eating, and focusing on food with a 10:1 ratio of calories: grams of protein, Self-help books such as 5 second rule.  Additional resources provided today: None   Medical Interventions/ Pharmacotherapy Previous Bariatric surgery: none Pharmacotherapy for weight loss: She is currently taking Metformin  500 0.5 a tab twice daily and Topiramate 25 mg  daily for medical weight loss.    We discussed various medication options to help Melissa Higgins with her weight loss efforts and we both agreed to : See Pre-DM note.   B) OBESITY RELATED CONDITIONS ADDRESSED TODAY:  Pre-diabetes Assessment & Plan Lab Results  Component Value Date   HGBA1C 5.5 11/21/2023   HGBA1C 5.7 03/05/2020   HGBA1C 5.6 04/19/2019   INSULIN  13.0 11/21/2023   Currently on Metformin  500 mg half a tab twice daily with good compliance and tolerance. Denies side effects. Reports cravings are overall well controlled. Pt is tolerating medication well and reports slightly increase in hunger despite following meal plan. Increase Metformin  dose to 500 mg twice daily to help subside hunger and cravings. A1c is well controlled. No acute concerns. Cont decreasing simple carbs/sugars and increasing lean proteins. Will gradually increase exercise as able.     Mood disorder - Emotional Eating Assessment & Plan Currently taking Sertraline  150 mg once daily with good compliance and tolerance. Mood is well controlled today. No acute concerns. Cont PNP and focus on hitting protein goals to reduce any cravings.  Cont to follow up with counselor and psychiatrist as needed. Obtain 7-9 hours of sleep per night and implement stress management strategies.     Vitamin D  deficiency Assessment & Plan Lab Results  Component Value Date   VD25OH 39.7 11/21/2023  Currently taking OTC Vit D 2,000 units daily with good compliance and tolerance. Vit D levels are sub optimal at 39.7; optimal between 50-70. Cont regimen. Will recheck levels as necessary.     Medications Discontinued During This Encounter  Medication Reason   metFORMIN  (GLUCOPHAGE ) 500 MG tablet Reorder     Meds ordered this encounter  Medications   metFORMIN  (GLUCOPHAGE ) 500 MG tablet    Sig: 1 tab bid with meals    Dispense:  60 tablet    Refill:  0      Follow up:   Return 02/05/2024 4:00 PM.  She was informed of the importance of frequent follow up visits to maximize her success with intensive lifestyle modifications for her multiple health conditions.   Weight Summary and Biometrics   Weight Lost Since Last Visit: 12lb  Weight Gained Since Last Visit: 0lb    Vitals Temp: 97.8 F (36.6 C) BP: 137/83 Pulse Rate: 69 SpO2: 98 %   Anthropometric Measurements Height: 5' 10.5 (1.791  m) Weight: 253 lb (114.8 kg) BMI (Calculated): 35.78 Weight at Last Visit: 265lb Weight Lost Since Last Visit: 12lb Weight Gained Since Last Visit: 0lb Starting Weight: 260lb Total Weight Loss (lbs): 7 lb (3.175 kg) Peak Weight: 275lb   Body Composition  Body Fat %: 45.4 % Fat Mass (lbs): 115.2 lbs Muscle Mass (lbs): 131.4 lbs Total Body Water (lbs): 92.2 lbs Visceral Fat Rating : 12   Other Clinical Data Fasting: no Labs: no Today's Visit #: 3 Starting Date: 11/21/23    Objective:   PHYSICAL EXAM: Blood pressure 137/83, pulse 69, temperature 97.8 F (36.6 C), height 5' 10.5 (1.791 m), weight 253 lb (114.8 kg), last menstrual period 12/13/2023, SpO2 98%. Body mass index is 35.79 kg/m.  General: she is overweight, cooperative and in no acute distress. PSYCH: Has normal mood, affect and thought process.   HEENT: EOMI, sclerae are anicteric. Lungs: Normal breathing effort, no conversational dyspnea. Extremities: Moves * 4 Neurologic: A and O * 3, good insight  DIAGNOSTIC DATA REVIEWED: BMET    Component Value Date/Time   NA 139 11/21/2023 1000  K 4.1 11/21/2023 1000   CL 103 11/21/2023 1000   CO2 19 (L) 11/21/2023 1000   GLUCOSE 79 11/21/2023 1000   GLUCOSE 100 (H) 08/18/2023 0932   BUN 24 11/21/2023 1000   CREATININE 0.82 11/21/2023 1000   CREATININE 0.72 11/25/2016 1608   CALCIUM 9.3 11/21/2023 1000   GFRNONAA >60 01/25/2023 1631   Lab Results  Component Value Date   HGBA1C 5.5 11/21/2023   HGBA1C 5.9 05/02/2018   Lab Results  Component Value Date   INSULIN  13.0 11/21/2023   Lab Results  Component Value Date   TSH 2.250 11/21/2023   CBC    Component Value Date/Time   WBC 11.3 (H) 11/21/2023 1000   WBC 9.8 04/04/2023 1625   RBC 5.04 11/21/2023 1000   RBC 4.44 04/04/2023 1625   HGB 15.6 11/21/2023 1000   HCT 47.5 (H) 11/21/2023 1000   PLT 304 11/21/2023 1000   MCV 94 11/21/2023 1000   MCH 31.0 11/21/2023 1000   MCH 29.1 01/25/2023 1631    MCHC 32.8 11/21/2023 1000   MCHC 32.9 04/04/2023 1625   RDW 12.8 11/21/2023 1000   Iron Studies    Component Value Date/Time   IRON 28 (L) 11/23/2018 0957   FERRITIN 18.2 04/19/2019 1146   IRONPCTSAT 5.6 (L) 11/23/2018 0957   Lipid Panel     Component Value Date/Time   CHOL 270 (H) 11/21/2023 1000   TRIG 154 (H) 11/21/2023 1000   HDL 55 11/21/2023 1000   CHOLHDL 4.9 (H) 11/21/2023 1000   CHOLHDL 5 08/18/2023 0932   VLDL 21.0 08/18/2023 0932   LDLCALC 187 (H) 11/21/2023 1000   LDLCALC 155 (H) 11/25/2016 1608   Hepatic Function Panel     Component Value Date/Time   PROT 7.1 11/21/2023 1000   ALBUMIN 4.4 11/21/2023 1000   AST 13 11/21/2023 1000   ALT 14 11/21/2023 1000   ALKPHOS 117 (H) 11/21/2023 1000   BILITOT 0.5 11/21/2023 1000   BILIDIR 0.1 04/19/2019 1146      Component Value Date/Time   TSH 2.250 11/21/2023 1000   Nutritional Lab Results  Component Value Date   VD25OH 39.7 11/21/2023    Attestations:   Melissa Higgins, acting as a stage manager for [employer], DO., have compiled all relevant documentation for today's office visit on behalf of Melissa Jenkins, DO, while in the presence of [employer], DO.    I have reviewed the above documentation for accuracy and completeness, and I agree with the above. Melissa Higgins, D.O.  The 21st Century Cures Act was signed into law in 2016 which includes the topic of electronic health records.  This provides immediate access to information in MyChart.  This includes consultation notes, operative notes, office notes, lab results and pathology reports.  If you have any questions about what you read please let us  know at your next visit so we can discuss your concerns and take corrective action if need be.  We are right here with you.  "

## 2024-01-16 DIAGNOSIS — R7303 Prediabetes: Secondary | ICD-10-CM
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
## 2024-01-21 ENCOUNTER — Other Ambulatory Visit: Payer: Self-pay | Admitting: Podiatry

## 2024-01-24 ENCOUNTER — Ambulatory Visit: Admitting: Psychiatry

## 2024-01-24 ENCOUNTER — Encounter: Payer: Self-pay | Admitting: Psychiatry

## 2024-01-24 DIAGNOSIS — R5383 Other fatigue: Secondary | ICD-10-CM

## 2024-01-24 DIAGNOSIS — F330 Major depressive disorder, recurrent, mild: Secondary | ICD-10-CM | POA: Diagnosis not present

## 2024-01-24 MED ORDER — VENLAFAXINE HCL ER 37.5 MG PO CP24: ORAL_CAPSULE | ORAL | 0 refills | Status: AC

## 2024-01-24 MED ORDER — VENLAFAXINE HCL ER 150 MG PO CP24: 150.0000 mg | ORAL_CAPSULE | Freq: Every day | ORAL | 0 refills | Status: AC

## 2024-01-24 NOTE — Patient Instructions (Signed)
 Decrease sertraline  100 mg daily for 1 week , then 50 mg daily for 1 week , then discontinue  Start Venlafaxine  37.5 mg daily for one week, then 75 mg daily for one week, then 150 mg daily Next appointment 2/26 at 3 30

## 2024-02-05 ENCOUNTER — Ambulatory Visit (INDEPENDENT_AMBULATORY_CARE_PROVIDER_SITE_OTHER): Admitting: Family Medicine

## 2024-02-07 ENCOUNTER — Telehealth (INDEPENDENT_AMBULATORY_CARE_PROVIDER_SITE_OTHER): Payer: Self-pay | Admitting: Family Medicine

## 2024-02-07 NOTE — Telephone Encounter (Signed)
 Lvm for pt to call to r/s appt that was canceled due to weather.

## 2024-02-26 ENCOUNTER — Ambulatory Visit (INDEPENDENT_AMBULATORY_CARE_PROVIDER_SITE_OTHER): Admitting: Family Medicine

## 2024-02-27 ENCOUNTER — Ambulatory Visit: Admitting: Podiatry

## 2024-03-04 ENCOUNTER — Ambulatory Visit: Admitting: Internal Medicine

## 2024-03-07 ENCOUNTER — Ambulatory Visit: Admitting: Psychiatry

## 2024-04-01 ENCOUNTER — Ambulatory Visit: Admitting: Sleep Medicine
